# Patient Record
Sex: Male | Born: 1985
Health system: Southern US, Community
[De-identification: ages and names within clinical notes are randomized; demographics above are authoritative.]

## PROBLEM LIST (undated history)

## (undated) DIAGNOSIS — F840 Autistic disorder: Secondary | ICD-10-CM

## (undated) DIAGNOSIS — T7840XA Allergy, unspecified, initial encounter: Secondary | ICD-10-CM

## (undated) DIAGNOSIS — E785 Hyperlipidemia, unspecified: Secondary | ICD-10-CM

## (undated) DIAGNOSIS — F29 Unspecified psychosis not due to a substance or known physiological condition: Secondary | ICD-10-CM

## (undated) DIAGNOSIS — I10 Essential (primary) hypertension: Secondary | ICD-10-CM

## (undated) DIAGNOSIS — E559 Vitamin D deficiency, unspecified: Secondary | ICD-10-CM

## (undated) HISTORY — DX: Unspecified psychosis not due to a substance or known physiological condition: F29

## (undated) HISTORY — DX: Vitamin D deficiency, unspecified: E55.9

## (undated) HISTORY — DX: Essential (primary) hypertension: I10

## (undated) HISTORY — PX: TONSILLECTOMY AND ADENOIDECTOMY: SUR1326

## (undated) HISTORY — DX: Hyperlipidemia, unspecified: E78.5

## (undated) HISTORY — DX: Allergy, unspecified, initial encounter: T78.40XA

## (undated) HISTORY — DX: Autistic disorder: F84.0

---
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
## 2008-07-17 DIAGNOSIS — E291 Testicular hypofunction: Secondary | ICD-10-CM

## 2009-01-15 DIAGNOSIS — E559 Vitamin D deficiency, unspecified: Secondary | ICD-10-CM

## 2011-05-18 DIAGNOSIS — E559 Vitamin D deficiency, unspecified: Secondary | ICD-10-CM | POA: Diagnosis not present

## 2011-05-18 DIAGNOSIS — Z23 Encounter for immunization: Secondary | ICD-10-CM | POA: Diagnosis not present

## 2011-05-18 DIAGNOSIS — L210 Seborrhea capitis: Secondary | ICD-10-CM | POA: Diagnosis not present

## 2011-05-18 DIAGNOSIS — I10 Essential (primary) hypertension: Secondary | ICD-10-CM | POA: Diagnosis not present

## 2011-05-18 DIAGNOSIS — J309 Allergic rhinitis, unspecified: Secondary | ICD-10-CM | POA: Diagnosis not present

## 2011-05-29 DIAGNOSIS — F29 Unspecified psychosis not due to a substance or known physiological condition: Secondary | ICD-10-CM | POA: Diagnosis not present

## 2011-07-10 DIAGNOSIS — F840 Autistic disorder: Secondary | ICD-10-CM | POA: Diagnosis not present

## 2011-07-10 DIAGNOSIS — F29 Unspecified psychosis not due to a substance or known physiological condition: Secondary | ICD-10-CM | POA: Diagnosis not present

## 2011-09-05 DIAGNOSIS — F840 Autistic disorder: Secondary | ICD-10-CM | POA: Diagnosis not present

## 2011-09-05 DIAGNOSIS — Z8659 Personal history of other mental and behavioral disorders: Secondary | ICD-10-CM | POA: Diagnosis not present

## 2011-09-05 DIAGNOSIS — F79 Unspecified intellectual disabilities: Secondary | ICD-10-CM | POA: Diagnosis not present

## 2011-09-05 DIAGNOSIS — F29 Unspecified psychosis not due to a substance or known physiological condition: Secondary | ICD-10-CM | POA: Diagnosis not present

## 2011-09-06 DIAGNOSIS — F840 Autistic disorder: Secondary | ICD-10-CM | POA: Diagnosis not present

## 2011-09-06 DIAGNOSIS — E785 Hyperlipidemia, unspecified: Secondary | ICD-10-CM | POA: Diagnosis not present

## 2011-09-06 DIAGNOSIS — F71 Moderate intellectual disabilities: Secondary | ICD-10-CM | POA: Diagnosis not present

## 2011-09-06 DIAGNOSIS — I10 Essential (primary) hypertension: Secondary | ICD-10-CM | POA: Diagnosis not present

## 2011-09-06 DIAGNOSIS — F79 Unspecified intellectual disabilities: Secondary | ICD-10-CM | POA: Diagnosis not present

## 2011-09-06 DIAGNOSIS — F603 Borderline personality disorder: Secondary | ICD-10-CM | POA: Diagnosis not present

## 2011-09-06 DIAGNOSIS — K5900 Constipation, unspecified: Secondary | ICD-10-CM | POA: Diagnosis not present

## 2011-09-06 DIAGNOSIS — F29 Unspecified psychosis not due to a substance or known physiological condition: Secondary | ICD-10-CM | POA: Diagnosis not present

## 2011-09-06 DIAGNOSIS — Z8659 Personal history of other mental and behavioral disorders: Secondary | ICD-10-CM | POA: Diagnosis not present

## 2011-10-06 DIAGNOSIS — F29 Unspecified psychosis not due to a substance or known physiological condition: Secondary | ICD-10-CM | POA: Diagnosis not present

## 2011-10-06 DIAGNOSIS — F71 Moderate intellectual disabilities: Secondary | ICD-10-CM | POA: Diagnosis not present

## 2011-10-13 DIAGNOSIS — F29 Unspecified psychosis not due to a substance or known physiological condition: Secondary | ICD-10-CM | POA: Diagnosis not present

## 2011-10-13 DIAGNOSIS — E785 Hyperlipidemia, unspecified: Secondary | ICD-10-CM | POA: Diagnosis not present

## 2011-10-13 DIAGNOSIS — F71 Moderate intellectual disabilities: Secondary | ICD-10-CM | POA: Diagnosis not present

## 2011-10-13 DIAGNOSIS — I10 Essential (primary) hypertension: Secondary | ICD-10-CM | POA: Diagnosis not present

## 2011-11-30 DIAGNOSIS — F29 Unspecified psychosis not due to a substance or known physiological condition: Secondary | ICD-10-CM | POA: Diagnosis not present

## 2011-11-30 DIAGNOSIS — F71 Moderate intellectual disabilities: Secondary | ICD-10-CM | POA: Diagnosis not present

## 2011-12-21 DIAGNOSIS — F29 Unspecified psychosis not due to a substance or known physiological condition: Secondary | ICD-10-CM | POA: Diagnosis not present

## 2011-12-21 DIAGNOSIS — F71 Moderate intellectual disabilities: Secondary | ICD-10-CM | POA: Diagnosis not present

## 2012-01-15 DIAGNOSIS — F840 Autistic disorder: Secondary | ICD-10-CM | POA: Diagnosis not present

## 2012-01-15 DIAGNOSIS — F71 Moderate intellectual disabilities: Secondary | ICD-10-CM | POA: Diagnosis not present

## 2012-01-15 DIAGNOSIS — I10 Essential (primary) hypertension: Secondary | ICD-10-CM | POA: Diagnosis not present

## 2012-01-15 DIAGNOSIS — E785 Hyperlipidemia, unspecified: Secondary | ICD-10-CM | POA: Diagnosis not present

## 2012-02-22 DIAGNOSIS — F29 Unspecified psychosis not due to a substance or known physiological condition: Secondary | ICD-10-CM | POA: Diagnosis not present

## 2012-02-22 DIAGNOSIS — F71 Moderate intellectual disabilities: Secondary | ICD-10-CM | POA: Diagnosis not present

## 2012-05-17 DIAGNOSIS — F71 Moderate intellectual disabilities: Secondary | ICD-10-CM | POA: Diagnosis not present

## 2012-05-17 DIAGNOSIS — I10 Essential (primary) hypertension: Secondary | ICD-10-CM | POA: Diagnosis not present

## 2012-05-17 DIAGNOSIS — R350 Frequency of micturition: Secondary | ICD-10-CM | POA: Diagnosis not present

## 2012-05-17 DIAGNOSIS — E785 Hyperlipidemia, unspecified: Secondary | ICD-10-CM | POA: Diagnosis not present

## 2012-05-21 DIAGNOSIS — I10 Essential (primary) hypertension: Secondary | ICD-10-CM | POA: Diagnosis not present

## 2012-05-21 DIAGNOSIS — R81 Glycosuria: Secondary | ICD-10-CM | POA: Diagnosis not present

## 2012-05-21 DIAGNOSIS — R7309 Other abnormal glucose: Secondary | ICD-10-CM | POA: Diagnosis not present

## 2012-05-24 DIAGNOSIS — F71 Moderate intellectual disabilities: Secondary | ICD-10-CM | POA: Diagnosis not present

## 2012-05-24 DIAGNOSIS — F29 Unspecified psychosis not due to a substance or known physiological condition: Secondary | ICD-10-CM | POA: Diagnosis not present

## 2012-06-13 DIAGNOSIS — N390 Urinary tract infection, site not specified: Secondary | ICD-10-CM | POA: Diagnosis not present

## 2012-06-13 DIAGNOSIS — N039 Chronic nephritic syndrome with unspecified morphologic changes: Secondary | ICD-10-CM | POA: Diagnosis not present

## 2012-06-13 DIAGNOSIS — R809 Proteinuria, unspecified: Secondary | ICD-10-CM | POA: Diagnosis not present

## 2012-06-13 DIAGNOSIS — N2581 Secondary hyperparathyroidism of renal origin: Secondary | ICD-10-CM | POA: Diagnosis not present

## 2012-06-13 DIAGNOSIS — I10 Essential (primary) hypertension: Secondary | ICD-10-CM | POA: Diagnosis not present

## 2012-06-28 DIAGNOSIS — I10 Essential (primary) hypertension: Secondary | ICD-10-CM | POA: Diagnosis not present

## 2012-06-28 DIAGNOSIS — N184 Chronic kidney disease, stage 4 (severe): Secondary | ICD-10-CM | POA: Diagnosis not present

## 2012-06-28 DIAGNOSIS — N2581 Secondary hyperparathyroidism of renal origin: Secondary | ICD-10-CM | POA: Diagnosis not present

## 2012-07-30 ENCOUNTER — Ambulatory Visit: Payer: Self-pay | Admitting: Hematology and Oncology

## 2012-07-30 DIAGNOSIS — F840 Autistic disorder: Secondary | ICD-10-CM | POA: Diagnosis not present

## 2012-07-30 DIAGNOSIS — F79 Unspecified intellectual disabilities: Secondary | ICD-10-CM | POA: Diagnosis not present

## 2012-07-30 DIAGNOSIS — N184 Chronic kidney disease, stage 4 (severe): Secondary | ICD-10-CM | POA: Diagnosis not present

## 2012-07-30 DIAGNOSIS — E559 Vitamin D deficiency, unspecified: Secondary | ICD-10-CM | POA: Diagnosis not present

## 2012-07-30 DIAGNOSIS — R5381 Other malaise: Secondary | ICD-10-CM | POA: Diagnosis not present

## 2012-07-30 DIAGNOSIS — R799 Abnormal finding of blood chemistry, unspecified: Secondary | ICD-10-CM | POA: Diagnosis not present

## 2012-07-30 DIAGNOSIS — Z79899 Other long term (current) drug therapy: Secondary | ICD-10-CM | POA: Diagnosis not present

## 2012-07-30 DIAGNOSIS — R350 Frequency of micturition: Secondary | ICD-10-CM | POA: Diagnosis not present

## 2012-07-30 DIAGNOSIS — E291 Testicular hypofunction: Secondary | ICD-10-CM | POA: Diagnosis not present

## 2012-07-30 DIAGNOSIS — I129 Hypertensive chronic kidney disease with stage 1 through stage 4 chronic kidney disease, or unspecified chronic kidney disease: Secondary | ICD-10-CM | POA: Diagnosis not present

## 2012-07-30 LAB — BASIC METABOLIC PANEL
Anion Gap: 7 (ref 7–16)
BUN: 22 mg/dL — ABNORMAL HIGH (ref 7–18)
Calcium, Total: 9.3 mg/dL (ref 8.5–10.1)
Chloride: 102 mmol/L (ref 98–107)
Co2: 30 mmol/L (ref 21–32)
Creatinine: 1.22 mg/dL (ref 0.60–1.30)
Glucose: 92 mg/dL (ref 65–99)
Osmolality: 281 (ref 275–301)
Potassium: 4.5 mmol/L (ref 3.5–5.1)
Sodium: 139 mmol/L (ref 136–145)

## 2012-07-30 LAB — CBC CANCER CENTER
Basophil %: 0.6 %
HCT: 31.7 % — ABNORMAL LOW (ref 40.0–52.0)
Lymphocyte #: 2.1 x10 3/mm (ref 1.0–3.6)
MCH: 30.3 pg (ref 26.0–34.0)
MCV: 92 fL (ref 80–100)
Monocyte #: 1 x10 3/mm (ref 0.2–1.0)
Neutrophil %: 55.6 %
RDW: 14 % (ref 11.5–14.5)

## 2012-07-31 LAB — PROT IMMUNOELECTROPHORES(ARMC)

## 2012-08-06 ENCOUNTER — Ambulatory Visit: Payer: Self-pay | Admitting: Hematology and Oncology

## 2012-08-06 DIAGNOSIS — R799 Abnormal finding of blood chemistry, unspecified: Secondary | ICD-10-CM | POA: Diagnosis not present

## 2012-08-16 DIAGNOSIS — F29 Unspecified psychosis not due to a substance or known physiological condition: Secondary | ICD-10-CM | POA: Diagnosis not present

## 2012-08-16 DIAGNOSIS — F71 Moderate intellectual disabilities: Secondary | ICD-10-CM | POA: Diagnosis not present

## 2012-08-19 DIAGNOSIS — N2581 Secondary hyperparathyroidism of renal origin: Secondary | ICD-10-CM | POA: Diagnosis not present

## 2012-08-19 DIAGNOSIS — N179 Acute kidney failure, unspecified: Secondary | ICD-10-CM | POA: Diagnosis not present

## 2012-08-19 DIAGNOSIS — N170 Acute kidney failure with tubular necrosis: Secondary | ICD-10-CM | POA: Diagnosis not present

## 2012-08-19 DIAGNOSIS — R7989 Other specified abnormal findings of blood chemistry: Secondary | ICD-10-CM | POA: Diagnosis not present

## 2012-08-19 DIAGNOSIS — D631 Anemia in chronic kidney disease: Secondary | ICD-10-CM | POA: Diagnosis not present

## 2012-08-19 DIAGNOSIS — N039 Chronic nephritic syndrome with unspecified morphologic changes: Secondary | ICD-10-CM | POA: Diagnosis not present

## 2012-08-19 DIAGNOSIS — R062 Wheezing: Secondary | ICD-10-CM | POA: Diagnosis not present

## 2012-08-19 DIAGNOSIS — I10 Essential (primary) hypertension: Secondary | ICD-10-CM | POA: Diagnosis not present

## 2012-09-05 ENCOUNTER — Ambulatory Visit: Payer: Self-pay | Admitting: Hematology and Oncology

## 2012-09-16 DIAGNOSIS — I10 Essential (primary) hypertension: Secondary | ICD-10-CM | POA: Diagnosis not present

## 2012-09-16 DIAGNOSIS — E785 Hyperlipidemia, unspecified: Secondary | ICD-10-CM | POA: Diagnosis not present

## 2012-09-16 DIAGNOSIS — F71 Moderate intellectual disabilities: Secondary | ICD-10-CM | POA: Diagnosis not present

## 2012-09-16 DIAGNOSIS — D638 Anemia in other chronic diseases classified elsewhere: Secondary | ICD-10-CM | POA: Diagnosis not present

## 2012-11-15 ENCOUNTER — Emergency Department: Payer: Self-pay | Admitting: Emergency Medicine

## 2012-11-15 DIAGNOSIS — I10 Essential (primary) hypertension: Secondary | ICD-10-CM | POA: Diagnosis not present

## 2012-11-15 DIAGNOSIS — F840 Autistic disorder: Secondary | ICD-10-CM | POA: Diagnosis not present

## 2012-11-15 DIAGNOSIS — R1110 Vomiting, unspecified: Secondary | ICD-10-CM | POA: Diagnosis not present

## 2012-11-15 DIAGNOSIS — Z79899 Other long term (current) drug therapy: Secondary | ICD-10-CM | POA: Diagnosis not present

## 2012-11-15 LAB — COMPREHENSIVE METABOLIC PANEL
Albumin: 4 g/dL (ref 3.4–5.0)
Alkaline Phosphatase: 70 U/L (ref 50–136)
BUN: 12 mg/dL (ref 7–18)
Calcium, Total: 9.5 mg/dL (ref 8.5–10.1)
Co2: 26 mmol/L (ref 21–32)
Creatinine: 0.75 mg/dL (ref 0.60–1.30)
EGFR (African American): >60
Glucose: 133 mg/dL — ABNORMAL HIGH (ref 65–99)
Potassium: 3.9 mmol/L (ref 3.5–5.1)
SGPT (ALT): 44 U/L (ref 12–78)
Sodium: 137 mmol/L (ref 136–145)

## 2012-11-15 LAB — URINALYSIS, COMPLETE
Ketone: NEGATIVE
Leukocyte Esterase: NEGATIVE
Nitrite: NEGATIVE
RBC,UR: <1 /HPF (ref 0–5)
Specific Gravity: 1.029 (ref 1.003–1.030)
Squamous Epithelial: <1

## 2012-11-15 LAB — CBC
HGB: 13.4 g/dL (ref 13.0–18.0)
MCV: 85 fL (ref 80–100)
RBC: 5.04 10*6/uL (ref 4.40–5.90)
RDW: 15.5 % — ABNORMAL HIGH (ref 11.5–14.5)
WBC: 9.9 10*3/uL (ref 3.8–10.6)

## 2012-11-15 LAB — LIPASE, BLOOD: Lipase: 65 U/L — ABNORMAL LOW (ref 73–393)

## 2012-11-18 DIAGNOSIS — R809 Proteinuria, unspecified: Secondary | ICD-10-CM | POA: Diagnosis not present

## 2012-11-18 DIAGNOSIS — R319 Hematuria, unspecified: Secondary | ICD-10-CM | POA: Diagnosis not present

## 2012-11-18 DIAGNOSIS — N179 Acute kidney failure, unspecified: Secondary | ICD-10-CM | POA: Diagnosis not present

## 2012-11-18 DIAGNOSIS — N2581 Secondary hyperparathyroidism of renal origin: Secondary | ICD-10-CM | POA: Diagnosis not present

## 2012-11-18 DIAGNOSIS — I10 Essential (primary) hypertension: Secondary | ICD-10-CM | POA: Diagnosis not present

## 2012-12-11 DIAGNOSIS — F29 Unspecified psychosis not due to a substance or known physiological condition: Secondary | ICD-10-CM | POA: Diagnosis not present

## 2012-12-11 DIAGNOSIS — F71 Moderate intellectual disabilities: Secondary | ICD-10-CM | POA: Diagnosis not present

## 2013-01-13 DIAGNOSIS — R350 Frequency of micturition: Secondary | ICD-10-CM | POA: Diagnosis not present

## 2013-01-13 DIAGNOSIS — R81 Glycosuria: Secondary | ICD-10-CM | POA: Diagnosis not present

## 2013-01-13 DIAGNOSIS — D638 Anemia in other chronic diseases classified elsewhere: Secondary | ICD-10-CM | POA: Diagnosis not present

## 2013-01-13 DIAGNOSIS — E8881 Metabolic syndrome: Secondary | ICD-10-CM | POA: Diagnosis not present

## 2013-01-13 DIAGNOSIS — R7309 Other abnormal glucose: Secondary | ICD-10-CM | POA: Diagnosis not present

## 2013-01-13 DIAGNOSIS — Z79899 Other long term (current) drug therapy: Secondary | ICD-10-CM | POA: Diagnosis not present

## 2013-01-13 DIAGNOSIS — E785 Hyperlipidemia, unspecified: Secondary | ICD-10-CM | POA: Diagnosis not present

## 2013-01-13 DIAGNOSIS — Z87448 Personal history of other diseases of urinary system: Secondary | ICD-10-CM | POA: Diagnosis not present

## 2013-01-13 DIAGNOSIS — I10 Essential (primary) hypertension: Secondary | ICD-10-CM | POA: Diagnosis not present

## 2013-02-07 DIAGNOSIS — F29 Unspecified psychosis not due to a substance or known physiological condition: Secondary | ICD-10-CM | POA: Diagnosis not present

## 2013-02-07 DIAGNOSIS — F71 Moderate intellectual disabilities: Secondary | ICD-10-CM | POA: Diagnosis not present

## 2013-03-28 DIAGNOSIS — F29 Unspecified psychosis not due to a substance or known physiological condition: Secondary | ICD-10-CM | POA: Diagnosis not present

## 2013-03-28 DIAGNOSIS — F71 Moderate intellectual disabilities: Secondary | ICD-10-CM | POA: Diagnosis not present

## 2013-05-15 DIAGNOSIS — I10 Essential (primary) hypertension: Secondary | ICD-10-CM | POA: Diagnosis not present

## 2013-05-15 DIAGNOSIS — R944 Abnormal results of kidney function studies: Secondary | ICD-10-CM | POA: Diagnosis not present

## 2013-05-15 DIAGNOSIS — E8881 Metabolic syndrome: Secondary | ICD-10-CM | POA: Diagnosis not present

## 2013-05-15 DIAGNOSIS — E785 Hyperlipidemia, unspecified: Secondary | ICD-10-CM | POA: Diagnosis not present

## 2013-05-19 DIAGNOSIS — I10 Essential (primary) hypertension: Secondary | ICD-10-CM | POA: Diagnosis not present

## 2013-05-19 DIAGNOSIS — R000 Tachycardia, unspecified: Secondary | ICD-10-CM | POA: Diagnosis not present

## 2013-05-19 DIAGNOSIS — N179 Acute kidney failure, unspecified: Secondary | ICD-10-CM | POA: Diagnosis not present

## 2013-05-19 DIAGNOSIS — R809 Proteinuria, unspecified: Secondary | ICD-10-CM | POA: Diagnosis not present

## 2013-05-28 DIAGNOSIS — F71 Moderate intellectual disabilities: Secondary | ICD-10-CM | POA: Diagnosis not present

## 2013-05-28 DIAGNOSIS — F29 Unspecified psychosis not due to a substance or known physiological condition: Secondary | ICD-10-CM | POA: Diagnosis not present

## 2013-07-30 DIAGNOSIS — F29 Unspecified psychosis not due to a substance or known physiological condition: Secondary | ICD-10-CM | POA: Diagnosis not present

## 2013-07-30 DIAGNOSIS — F71 Moderate intellectual disabilities: Secondary | ICD-10-CM | POA: Diagnosis not present

## 2013-08-21 DIAGNOSIS — E785 Hyperlipidemia, unspecified: Secondary | ICD-10-CM | POA: Diagnosis not present

## 2013-08-21 DIAGNOSIS — I10 Essential (primary) hypertension: Secondary | ICD-10-CM | POA: Diagnosis not present

## 2013-08-21 DIAGNOSIS — F840 Autistic disorder: Secondary | ICD-10-CM | POA: Diagnosis not present

## 2013-08-21 DIAGNOSIS — E8881 Metabolic syndrome: Secondary | ICD-10-CM | POA: Diagnosis not present

## 2013-08-21 LAB — HEMOGLOBIN A1C: Hgb A1c MFr Bld: 4.6 % (ref 4.0–6.0)

## 2013-08-25 DIAGNOSIS — E785 Hyperlipidemia, unspecified: Secondary | ICD-10-CM | POA: Diagnosis not present

## 2013-08-25 DIAGNOSIS — R809 Proteinuria, unspecified: Secondary | ICD-10-CM | POA: Diagnosis not present

## 2013-08-25 DIAGNOSIS — N179 Acute kidney failure, unspecified: Secondary | ICD-10-CM | POA: Diagnosis not present

## 2013-08-25 DIAGNOSIS — D649 Anemia, unspecified: Secondary | ICD-10-CM | POA: Diagnosis not present

## 2013-08-25 DIAGNOSIS — N289 Disorder of kidney and ureter, unspecified: Secondary | ICD-10-CM | POA: Diagnosis not present

## 2013-08-25 DIAGNOSIS — I10 Essential (primary) hypertension: Secondary | ICD-10-CM | POA: Diagnosis not present

## 2013-08-25 LAB — LIPID PANEL
CHOLESTEROL: 173 mg/dL (ref 0–200)
HDL: 35 mg/dL (ref 35–70)
LDL Cholesterol: 102 mg/dL
TRIGLYCERIDES: 178 mg/dL — AB (ref 40–160)

## 2013-09-24 DIAGNOSIS — F29 Unspecified psychosis not due to a substance or known physiological condition: Secondary | ICD-10-CM | POA: Diagnosis not present

## 2013-11-20 DIAGNOSIS — I10 Essential (primary) hypertension: Secondary | ICD-10-CM | POA: Diagnosis not present

## 2013-11-20 DIAGNOSIS — F71 Moderate intellectual disabilities: Secondary | ICD-10-CM | POA: Diagnosis not present

## 2013-11-20 DIAGNOSIS — E8881 Metabolic syndrome: Secondary | ICD-10-CM | POA: Diagnosis not present

## 2013-11-20 DIAGNOSIS — E785 Hyperlipidemia, unspecified: Secondary | ICD-10-CM | POA: Diagnosis not present

## 2013-11-28 DIAGNOSIS — F29 Unspecified psychosis not due to a substance or known physiological condition: Secondary | ICD-10-CM | POA: Diagnosis not present

## 2014-01-22 DIAGNOSIS — F29 Unspecified psychosis not due to a substance or known physiological condition: Secondary | ICD-10-CM | POA: Diagnosis not present

## 2014-01-30 DIAGNOSIS — F29 Unspecified psychosis not due to a substance or known physiological condition: Secondary | ICD-10-CM | POA: Diagnosis not present

## 2014-02-19 DIAGNOSIS — F419 Anxiety disorder, unspecified: Secondary | ICD-10-CM | POA: Diagnosis not present

## 2014-02-20 DIAGNOSIS — G4700 Insomnia, unspecified: Secondary | ICD-10-CM | POA: Diagnosis not present

## 2014-02-20 DIAGNOSIS — E669 Obesity, unspecified: Secondary | ICD-10-CM | POA: Diagnosis not present

## 2014-02-20 DIAGNOSIS — E785 Hyperlipidemia, unspecified: Secondary | ICD-10-CM | POA: Diagnosis not present

## 2014-02-20 DIAGNOSIS — Z713 Dietary counseling and surveillance: Secondary | ICD-10-CM | POA: Diagnosis not present

## 2014-02-20 DIAGNOSIS — J309 Allergic rhinitis, unspecified: Secondary | ICD-10-CM | POA: Diagnosis not present

## 2014-02-20 DIAGNOSIS — F71 Moderate intellectual disabilities: Secondary | ICD-10-CM | POA: Diagnosis not present

## 2014-02-20 DIAGNOSIS — I10 Essential (primary) hypertension: Secondary | ICD-10-CM | POA: Diagnosis not present

## 2014-04-16 DIAGNOSIS — F29 Unspecified psychosis not due to a substance or known physiological condition: Secondary | ICD-10-CM | POA: Diagnosis not present

## 2014-06-15 DIAGNOSIS — F29 Unspecified psychosis not due to a substance or known physiological condition: Secondary | ICD-10-CM | POA: Diagnosis not present

## 2014-06-23 DIAGNOSIS — E785 Hyperlipidemia, unspecified: Secondary | ICD-10-CM | POA: Diagnosis not present

## 2014-06-23 DIAGNOSIS — J309 Allergic rhinitis, unspecified: Secondary | ICD-10-CM | POA: Diagnosis not present

## 2014-06-23 DIAGNOSIS — E559 Vitamin D deficiency, unspecified: Secondary | ICD-10-CM | POA: Diagnosis not present

## 2014-06-23 DIAGNOSIS — L210 Seborrhea capitis: Secondary | ICD-10-CM | POA: Diagnosis not present

## 2014-06-23 DIAGNOSIS — F71 Moderate intellectual disabilities: Secondary | ICD-10-CM | POA: Diagnosis not present

## 2014-06-23 DIAGNOSIS — I10 Essential (primary) hypertension: Secondary | ICD-10-CM | POA: Diagnosis not present

## 2014-06-23 DIAGNOSIS — E669 Obesity, unspecified: Secondary | ICD-10-CM | POA: Diagnosis not present

## 2014-06-23 DIAGNOSIS — G4700 Insomnia, unspecified: Secondary | ICD-10-CM | POA: Diagnosis not present

## 2014-07-13 DIAGNOSIS — F29 Unspecified psychosis not due to a substance or known physiological condition: Secondary | ICD-10-CM | POA: Diagnosis not present

## 2014-08-10 DIAGNOSIS — F29 Unspecified psychosis not due to a substance or known physiological condition: Secondary | ICD-10-CM | POA: Diagnosis not present

## 2014-08-28 DIAGNOSIS — E663 Overweight: Secondary | ICD-10-CM | POA: Diagnosis not present

## 2014-08-28 DIAGNOSIS — Z1389 Encounter for screening for other disorder: Secondary | ICD-10-CM | POA: Diagnosis not present

## 2014-08-28 DIAGNOSIS — R6889 Other general symptoms and signs: Secondary | ICD-10-CM | POA: Diagnosis not present

## 2014-08-28 DIAGNOSIS — Z0000 Encounter for general adult medical examination without abnormal findings: Secondary | ICD-10-CM | POA: Diagnosis not present

## 2014-08-28 DIAGNOSIS — I10 Essential (primary) hypertension: Secondary | ICD-10-CM | POA: Diagnosis not present

## 2014-09-07 DIAGNOSIS — F29 Unspecified psychosis not due to a substance or known physiological condition: Secondary | ICD-10-CM | POA: Diagnosis not present

## 2014-10-23 ENCOUNTER — Ambulatory Visit: Payer: Self-pay | Admitting: Family Medicine

## 2014-10-25 ENCOUNTER — Encounter: Payer: Self-pay | Admitting: Family Medicine

## 2014-10-25 DIAGNOSIS — Z87448 Personal history of other diseases of urinary system: Secondary | ICD-10-CM | POA: Insufficient documentation

## 2014-10-25 DIAGNOSIS — E8881 Metabolic syndrome: Secondary | ICD-10-CM | POA: Insufficient documentation

## 2014-10-25 DIAGNOSIS — L210 Seborrhea capitis: Secondary | ICD-10-CM | POA: Insufficient documentation

## 2014-10-25 DIAGNOSIS — J3089 Other allergic rhinitis: Secondary | ICD-10-CM | POA: Insufficient documentation

## 2014-10-25 DIAGNOSIS — F71 Moderate intellectual disabilities: Secondary | ICD-10-CM | POA: Insufficient documentation

## 2014-10-25 DIAGNOSIS — F29 Unspecified psychosis not due to a substance or known physiological condition: Secondary | ICD-10-CM | POA: Insufficient documentation

## 2014-10-25 DIAGNOSIS — F5104 Psychophysiologic insomnia: Secondary | ICD-10-CM | POA: Insufficient documentation

## 2014-10-25 DIAGNOSIS — F840 Autistic disorder: Secondary | ICD-10-CM | POA: Insufficient documentation

## 2014-10-25 DIAGNOSIS — I10 Essential (primary) hypertension: Secondary | ICD-10-CM | POA: Insufficient documentation

## 2014-10-25 DIAGNOSIS — E669 Obesity, unspecified: Secondary | ICD-10-CM | POA: Insufficient documentation

## 2014-10-28 ENCOUNTER — Ambulatory Visit: Payer: Self-pay | Admitting: Family Medicine

## 2014-11-05 DIAGNOSIS — F209 Schizophrenia, unspecified: Secondary | ICD-10-CM | POA: Diagnosis not present

## 2014-11-10 ENCOUNTER — Other Ambulatory Visit: Payer: Self-pay | Admitting: Family Medicine

## 2014-11-10 ENCOUNTER — Telehealth: Payer: Self-pay | Admitting: Family Medicine

## 2014-11-10 MED ORDER — LISINOPRIL 20 MG PO TABS: 20.0000 mg | ORAL_TABLET | Freq: Every day | ORAL | Status: DC

## 2014-11-10 NOTE — Telephone Encounter (Signed)
I sent refill to his pharmacy today. He can come in after I return. Thank you

## 2014-11-10 NOTE — Telephone Encounter (Signed)
Patient was scheduled an appointment for tomorrow however his mother is not able to get off work unless it is late afternoon. Is it possible to refill his Lisinopril 20mg  and send it to [facility] and we can resch his appointment for when you return or is it possible to work him in Thursday around [phone]

## 2014-11-11 ENCOUNTER — Ambulatory Visit: Payer: Self-pay | Admitting: Family Medicine

## 2014-11-11 ENCOUNTER — Other Ambulatory Visit: Payer: Self-pay | Admitting: Family Medicine

## 2014-11-11 NOTE — Telephone Encounter (Signed)
Patient came in for appointment today. 

## 2014-11-11 NOTE — Progress Notes (Signed)
Patient is coming into today so we can find which pharmacy they prefer and to give them the prescription you printed for them.

## 2014-11-11 NOTE — Telephone Encounter (Signed)
Patient requesting refill. 

## 2014-11-26 DIAGNOSIS — F6089 Other specific personality disorders: Secondary | ICD-10-CM | POA: Diagnosis not present

## 2014-11-26 DIAGNOSIS — F29 Unspecified psychosis not due to a substance or known physiological condition: Secondary | ICD-10-CM | POA: Diagnosis not present

## 2014-11-26 DIAGNOSIS — F71 Moderate intellectual disabilities: Secondary | ICD-10-CM | POA: Diagnosis not present

## 2014-11-26 DIAGNOSIS — I10 Essential (primary) hypertension: Secondary | ICD-10-CM | POA: Diagnosis not present

## 2014-11-26 DIAGNOSIS — F918 Other conduct disorders: Secondary | ICD-10-CM | POA: Diagnosis not present

## 2014-11-26 DIAGNOSIS — F840 Autistic disorder: Secondary | ICD-10-CM | POA: Diagnosis not present

## 2014-11-26 DIAGNOSIS — F911 Conduct disorder, childhood-onset type: Secondary | ICD-10-CM | POA: Diagnosis not present

## 2014-11-26 DIAGNOSIS — Z888 Allergy status to other drugs, medicaments and biological substances status: Secondary | ICD-10-CM | POA: Diagnosis not present

## 2014-12-16 ENCOUNTER — Ambulatory Visit (INDEPENDENT_AMBULATORY_CARE_PROVIDER_SITE_OTHER): Payer: Medicare Other | Admitting: Family Medicine

## 2014-12-16 ENCOUNTER — Encounter: Payer: Self-pay | Admitting: Family Medicine

## 2014-12-16 VITALS — BP 124/82 | HR 98 | Temp 98.1°F | Resp 14 | Ht 64.0 in | Wt 178.8 lb

## 2014-12-16 DIAGNOSIS — I10 Essential (primary) hypertension: Secondary | ICD-10-CM

## 2014-12-16 DIAGNOSIS — E8881 Metabolic syndrome: Secondary | ICD-10-CM | POA: Diagnosis not present

## 2014-12-16 DIAGNOSIS — F71 Moderate intellectual disabilities: Secondary | ICD-10-CM | POA: Diagnosis not present

## 2014-12-16 DIAGNOSIS — R739 Hyperglycemia, unspecified: Secondary | ICD-10-CM | POA: Diagnosis not present

## 2014-12-16 DIAGNOSIS — E559 Vitamin D deficiency, unspecified: Secondary | ICD-10-CM

## 2014-12-16 DIAGNOSIS — Z13220 Encounter for screening for lipoid disorders: Secondary | ICD-10-CM

## 2014-12-16 MED ORDER — LISINOPRIL 20 MG PO TABS: 20.0000 mg | ORAL_TABLET | Freq: Every day | ORAL | Status: DC

## 2014-12-16 NOTE — Progress Notes (Signed)
Name: David Santos   MRN: [id number]    DOB: January 29, 1986   Date:12/16/2014       Progress Note  Subjective  Chief Complaint  Chief Complaint  Patient presents with  . Hypertension    HPI  Hypertension Benign: doing well on Lisinopril , off HCTZ because it caused acute renal failure.  He is compliant with medication -mother dispenses it for him daily   Moderate MR: he stays at home by himself during the day, at times gets aggressive but has been doing well lately. He sees a psychiatrist. Caregiver has to prepare his meals, and dispense his medications  Metabolic Syndrome: he is due for rechecking labs. He is eating healthier but gained some weight since last visit. Denies polyphagia, polydipsia or polyuria.  Patient Active Problem List   Diagnosis Date Noted  . Active autistic disorder 10/25/2014  . Benign essential hypertension 10/25/2014  . Chronic insomnia 10/25/2014  . History of acute renal failure 10/25/2014  . Dysmetabolic syndrome 10/25/2014  . Moderate mental retardation 10/25/2014  . Perennial allergic rhinitis 10/25/2014  . Overweight 10/25/2014  . General psychoses 10/25/2014  . Seborrhea capitis 10/25/2014  . Vitamin D deficiency 01/15/2009  . Testicular hypofunction 07/17/2008    History reviewed. No pertinent past surgical history.  History reviewed. No pertinent family history.  Social History   Social History  . Marital Status: Single    Spouse Name: N/A  . Number of Children: N/A  . Years of Education: N/A   Occupational History  . Not on file.   Social History Main Topics  . Smoking status: Never Smoker   . Smokeless tobacco: Never Used  . Alcohol Use: No  . Drug Use: No  . Sexual Activity: No   Other Topics Concern  . Not on file   Social History Narrative  . No narrative on file     Current outpatient prescriptions:  .  clonazePAM (KLONOPIN) 0.5 MG tablet, Take 1 tablet by mouth as needed., Disp: , Rfl:  .  diphenhydrAMINE  (BENADRYL) 50 MG capsule, Take 50 mg by mouth as needed., Disp: , Rfl:  .  divalproex (DEPAKOTE) 250 MG DR tablet, Take 1 tablet by mouth 2 (two) times daily., Disp: , Rfl:  .  fluticasone (FLONASE) 50 MCG/ACT nasal spray, Place 2 sprays into the nose as needed., Disp: , Rfl:  .  lisinopril (PRINIVIL,ZESTRIL) 20 MG tablet, Take 1 tablet (20 mg total) by mouth daily., Disp: 90 tablet, Rfl: 1 .  risperiDONE (RISPERDAL) 2 MG tablet, Take 1 tablet by mouth 2 (two) times daily., Disp: , Rfl:  .  risperidone (RISPERDAL) 4 MG tablet, Take 4 mg by mouth daily., Disp: , Rfl:   No Known Allergies   ROS  Constitutional: Negative for fever or significant  weight change.  Respiratory: Negative for cough and shortness of breath.   Cardiovascular: Negative for chest pain or palpitations.  Gastrointestinal: Negative for abdominal pain, no bowel changes.  Musculoskeletal: Negative for gait problem or joint swelling.  Skin: Negative for rash.  Neurological: Negative for dizziness or headache.  No other specific complaints in a complete review of systems (except as listed in HPI above).   Objective  Filed Vitals:   12/16/14 1122  BP: 124/82  Pulse: 98  Temp: 98.1 F (36.7 C)  TempSrc: Oral  Resp: 14  Height: 5\' 4"  (1.626 m)  Weight: 178 lb 12.8 oz (81.103 kg)  SpO2: 97%    Body mass index is 30.68  kg/(m^2).  Physical Exam  Constitutional: Patient appears well-developed and well-nourished. Obese No distress.  HEENT: head atraumatic, normocephalic, pupils equal and reactive to light, ears neck supple, throat within normal limits Cardiovascular: Normal rate, regular rhythm and normal heart sounds.  No murmur heard. No BLE edema. Pulmonary/Chest: Effort normal and breath sounds normal. No respiratory distress. Abdominal: Soft.  There is no tenderness. Psychiatric: Patient has a normal mood and affect. behavior is normal. Judgment and thought content normal.    PHQ2/9: Depression screen  PHQ 2/9 12/16/2014  Decreased Interest 0  Down, Depressed, Hopeless 0  PHQ - 2 Score 0    Fall Risk: Fall Risk  12/16/2014  Falls in the past year? No     Assessment & Plan  1. Benign essential hypertension Continue medication - lisinopril (PRINIVIL,ZESTRIL) 20 MG tablet; Take 1 tablet (20 mg total) by mouth daily.  Dispense: 90 tablet; Refill: 1 - CBC - Comprehensive metabolic panel  2. Moderate mental retardation stable  3. Vitamin D deficiency  - Vit D  25 hydroxy (rtn osteoporosis monitoring)  4. Dysmetabolic syndrome Discussed dietary modification, decrease carbohydrate  5. Lipid screening  - Lipid panel  6. Hyperglycemia  - Hemoglobin A1c

## 2014-12-17 ENCOUNTER — Telehealth: Payer: Self-pay

## 2014-12-17 LAB — LIPID PANEL
CHOL/HDL RATIO: 6 ratio — AB (ref 0.0–5.0)
Cholesterol, Total: 199 mg/dL (ref 100–199)
HDL: 33 mg/dL — ABNORMAL LOW (ref >39)
LDL Calculated: 128 mg/dL — ABNORMAL HIGH (ref 0–99)
Triglycerides: 190 mg/dL — ABNORMAL HIGH (ref 0–149)
VLDL Cholesterol Cal: 38 mg/dL (ref 5–40)

## 2014-12-17 LAB — VITAMIN D 25 HYDROXY (VIT D DEFICIENCY, FRACTURES): VIT D 25 HYDROXY: 22.1 ng/mL — AB (ref 30.0–100.0)

## 2014-12-17 LAB — COMPREHENSIVE METABOLIC PANEL
ALT: 26 IU/L (ref 0–44)
AST: 18 IU/L (ref 0–40)
Albumin/Globulin Ratio: 1.6 (ref 1.1–2.5)
Albumin: 4.4 g/dL (ref 3.5–5.5)
Alkaline Phosphatase: 43 IU/L (ref 39–117)
BUN/Creatinine Ratio: 11 (ref 8–19)
BUN: 9 mg/dL (ref 6–20)
Bilirubin Total: 0.3 mg/dL (ref 0.0–1.2)
CO2: 25 mmol/L (ref 18–29)
Calcium: 9.6 mg/dL (ref 8.7–10.2)
Chloride: 99 mmol/L (ref 97–108)
Creatinine, Ser: 0.79 mg/dL (ref 0.76–1.27)
GFR calc non Af Amer: 123 mL/min/{1.73_m2} (ref >59)
GFR, EST AFRICAN AMERICAN: 142 mL/min/{1.73_m2} (ref >59)
GLOBULIN, TOTAL: 2.8 g/dL (ref 1.5–4.5)
GLUCOSE: 85 mg/dL (ref 65–99)
POTASSIUM: 4.3 mmol/L (ref 3.5–5.2)
Sodium: 138 mmol/L (ref 134–144)
TOTAL PROTEIN: 7.2 g/dL (ref 6.0–8.5)

## 2014-12-17 LAB — CBC
HEMATOCRIT: 42.5 % (ref 37.5–51.0)
Hemoglobin: 14.4 g/dL (ref 12.6–17.7)
MCH: 29.5 pg (ref 26.6–33.0)
MCHC: 33.9 g/dL (ref 31.5–35.7)
MCV: 87 fL (ref 79–97)
Platelets: 255 10*3/uL (ref 150–379)
RBC: 4.88 x10E6/uL (ref 4.14–5.80)
RDW: 14.3 % (ref 12.3–15.4)
WBC: 6.2 10*3/uL (ref 3.4–10.8)

## 2014-12-17 LAB — HEMOGLOBIN A1C
Est. average glucose Bld gHb Est-mCnc: 117 mg/dL
Hgb A1c MFr Bld: 5.7 % — ABNORMAL HIGH (ref 4.8–5.6)

## 2014-12-17 NOTE — Telephone Encounter (Signed)
Left vm of labs

## 2014-12-18 ENCOUNTER — Telehealth: Payer: Self-pay | Admitting: Family Medicine

## 2014-12-18 NOTE — Telephone Encounter (Signed)
[name] (mom) is returning your call concerning the test results. She is needing clarification on the voicemail that you left. If you are not able to reach David Santos please call his grandmother to leave the information [name] (Mom) [phone] [name] (Grandmother) [phone]

## 2014-12-18 NOTE — Telephone Encounter (Signed)
Pt mother notified.

## 2014-12-28 DIAGNOSIS — I10 Essential (primary) hypertension: Secondary | ICD-10-CM | POA: Diagnosis not present

## 2014-12-28 DIAGNOSIS — F918 Other conduct disorders: Secondary | ICD-10-CM | POA: Diagnosis not present

## 2014-12-28 DIAGNOSIS — F840 Autistic disorder: Secondary | ICD-10-CM | POA: Diagnosis not present

## 2014-12-28 DIAGNOSIS — Z888 Allergy status to other drugs, medicaments and biological substances status: Secondary | ICD-10-CM | POA: Diagnosis not present

## 2014-12-28 DIAGNOSIS — F71 Moderate intellectual disabilities: Secondary | ICD-10-CM | POA: Diagnosis not present

## 2014-12-28 DIAGNOSIS — Z79899 Other long term (current) drug therapy: Secondary | ICD-10-CM | POA: Diagnosis not present

## 2014-12-29 DIAGNOSIS — F71 Moderate intellectual disabilities: Secondary | ICD-10-CM | POA: Diagnosis not present

## 2014-12-29 DIAGNOSIS — F840 Autistic disorder: Secondary | ICD-10-CM | POA: Diagnosis not present

## 2015-03-03 ENCOUNTER — Ambulatory Visit (INDEPENDENT_AMBULATORY_CARE_PROVIDER_SITE_OTHER): Payer: Medicare Other | Admitting: Family Medicine

## 2015-03-03 ENCOUNTER — Encounter: Payer: Self-pay | Admitting: Family Medicine

## 2015-03-03 VITALS — BP 124/86 | HR 106 | Temp 98.9°F | Resp 14 | Ht 64.0 in | Wt 182.4 lb

## 2015-03-03 DIAGNOSIS — I10 Essential (primary) hypertension: Secondary | ICD-10-CM | POA: Diagnosis not present

## 2015-03-03 DIAGNOSIS — F71 Moderate intellectual disabilities: Secondary | ICD-10-CM | POA: Diagnosis not present

## 2015-03-03 DIAGNOSIS — F840 Autistic disorder: Secondary | ICD-10-CM

## 2015-03-03 NOTE — Progress Notes (Signed)
Name: David Santos   MRN: [id number]    DOB: Oct 11, 1985   Date:03/03/2015       Progress Note  Subjective  Chief Complaint  Chief Complaint  Patient presents with  . Medication Refill    follow-up     mother needs [employer] paperwork filled out for work  . Hypertension    HPI  Hypertension Benign: doing well on Lisinopril , off HCTZ because it caused acute renal failure. He is compliant with medication -mother dispenses it for him daily , no chest pain, no SOB, no side effects of medication   Moderate MR: he is now staying with grandparents during the day, at times gets aggressive but has been doing well lately. He sees a Therapist, sports at [facility], but will be switching providers soon. . Caregiver has to prepare his meals, and dispense his medications, take him to doctor's visits. He cannot drive and his mother is his guardian. He is deemed incompetent.   Autistic Disorder: poor eye contact, retracts when around strangers.     Patient Active Problem List   Diagnosis Date Noted  . Active autistic disorder 10/25/2014  . Benign essential hypertension 10/25/2014  . Chronic insomnia 10/25/2014  . History of acute renal failure 10/25/2014  . Dysmetabolic syndrome 10/25/2014  . Moderate mental retardation 10/25/2014  . Perennial allergic rhinitis 10/25/2014  . Overweight 10/25/2014  . General psychoses 10/25/2014  . Seborrhea capitis 10/25/2014  . Vitamin D deficiency 01/15/2009  . Testicular hypofunction 07/17/2008    History reviewed. No pertinent past surgical history.  History reviewed. No pertinent family history.  Social History   Social History  . Marital Status: Single    Spouse Name: N/A  . Number of Children: N/A  . Years of Education: N/A   Occupational History  . Not on file.   Social History Main Topics  . Smoking status: Never Smoker   . Smokeless tobacco: Never Used  . Alcohol Use: No  . Drug Use: No  . Sexual Activity: No   Other Topics  Concern  . Not on file   Social History Narrative     Current outpatient prescriptions:  .  clonazePAM (KLONOPIN) 0.5 MG tablet, Take 1 tablet by mouth as needed., Disp: , Rfl:  .  diphenhydrAMINE (BENADRYL) 50 MG capsule, Take 50 mg by mouth as needed., Disp: , Rfl:  .  divalproex (DEPAKOTE) 250 MG DR tablet, Take 1 tablet by mouth 2 (two) times daily., Disp: , Rfl:  .  fluticasone (FLONASE) 50 MCG/ACT nasal spray, Place 2 sprays into the nose as needed., Disp: , Rfl:  .  lisinopril (PRINIVIL,ZESTRIL) 20 MG tablet, Take 1 tablet (20 mg total) by mouth daily., Disp: 90 tablet, Rfl: 1 .  risperiDONE (RISPERDAL) 2 MG tablet, Take 1 tablet by mouth 2 (two) times daily., Disp: , Rfl:  .  risperidone (RISPERDAL) 4 MG tablet, Take 4 mg by mouth daily., Disp: , Rfl:   No Known Allergies   ROS  Ten systems reviewed and is negative except as mentioned in HPI   Objective  Filed Vitals:   03/03/15 1011  BP: 124/86  Pulse: 106  Temp: 98.9 F (37.2 C)  TempSrc: Oral  Resp: 14  Height:  (1.626 m)  Weight: 182 lb 6.4 oz (82.736 kg)  SpO2: 96%    Body mass index is 31.29 kg/(m^2).  Physical Exam  Constitutional: Patient appears well-developed and well-nourished. Obese No distress.  HEENT: head atraumatic, normocephalic, pupils equal and  reactive to light, ears neck supple, throat within normal limits Cardiovascular: Normal rate, regular rhythm and normal heart sounds. No murmur heard. No BLE edema. Pulmonary/Chest: Effort normal and breath sounds normal. No respiratory distress. Abdominal: Soft. There is no tenderness. Psychiatric: Normal mood, blunt effect. Cooperative.  Poor eye contact  Recent Results (from the past 2160 hour(s))  CBC     Status: None   Collection Time: 12/16/14 12:20 PM  Result Value Ref Range   WBC 6.2 3.4 - 10.8 x10E3/uL   RBC 4.88 4.14 - 5.80 x10E6/uL   Hemoglobin 14.4 12.6 - 17.7 g/dL   Hematocrit 04.5 40.9 - 51.0 %   MCV 87 79 - 97 fL   MCH  29.5 26.6 - 33.0 pg   MCHC 33.9 31.5 - 35.7 g/dL   RDW 81.1 91.4 - 78.2 %   Platelets 255 150 - 379 x10E3/uL  Vit D  25 hydroxy (rtn osteoporosis monitoring)     Status: Abnormal   Collection Time: 12/16/14 12:20 PM  Result Value Ref Range   Vit D, 25-Hydroxy 22.1 (L) 30.0 - 100.0 ng/mL    Comment: Vitamin D deficiency has been defined by the Institute of Medicine and an Endocrine Society practice guideline as a level of serum 25-OH vitamin D less than 20 ng/mL (1,2). The Endocrine Society went on to further define vitamin D insufficiency as a level between 21 and 29 ng/mL (2). 1. IOM (Institute of Medicine). 2010. Dietary reference    intakes for calcium and D. [place]: The    [employer]. 2. Holick MF, Binkley Manata, Bischoff-Ferrari HA, et al.    Evaluation, treatment, and prevention of vitamin D    deficiency: an Endocrine Society clinical practice    guideline. JCEM. 2011 Jul; 96(7):1911-30.   Comprehensive metabolic panel     Status: None   Collection Time: 12/16/14 12:20 PM  Result Value Ref Range   Glucose 85 65 - 99 mg/dL   BUN 9 6 - 20 mg/dL   Creatinine, Ser 9.56 0.76 - 1.27 mg/dL   GFR calc non Af Amer 123 >59 mL/min/1.73   GFR calc Af Amer 142 >59 mL/min/1.73   BUN/Creatinine Ratio 11 8 - 19   Sodium 138 134 - 144 mmol/L   Potassium 4.3 3.5 - 5.2 mmol/L   Chloride 99 97 - 108 mmol/L   CO2 25 18 - 29 mmol/L   Calcium 9.6 8.7 - 10.2 mg/dL   Total Protein 7.2 6.0 - 8.5 g/dL   Albumin 4.4 3.5 - 5.5 g/dL   Globulin, Total 2.8 1.5 - 4.5 g/dL   Albumin/Globulin Ratio 1.6 1.1 - 2.5   Bilirubin Total 0.3 0.0 - 1.2 mg/dL   Alkaline Phosphatase 43 39 - 117 IU/L   AST 18 0 - 40 IU/L   ALT 26 0 - 44 IU/L  Lipid panel     Status: Abnormal   Collection Time: 12/16/14 12:20 PM  Result Value Ref Range   Cholesterol, Total 199 100 - 199 mg/dL   Triglycerides 213 (H) 0 - 149 mg/dL   HDL 33 (L) >08 mg/dL    Comment: According to ATP-III Guidelines, HDL-C >59  mg/dL is considered a negative risk factor for CHD.    VLDL Cholesterol Cal 38 5 - 40 mg/dL   LDL Calculated 657 (H) 0 - 99 mg/dL   Chol/HDL Ratio 6.0 (H) 0.0 - 5.0 ratio units    Comment:  T. Chol/HDL Ratio                                             Men  Women                               1/2 Avg.Risk  3.4    3.3                                   Avg.Risk  5.0    4.4                                2X Avg.Risk  9.6    7.1                                3X Avg.Risk 23.4   11.0   Hemoglobin A1c     Status: Abnormal   Collection Time: 12/16/14 12:20 PM  Result Value Ref Range   Hgb A1c MFr Bld 5.7 (H) 4.8 - 5.6 %    Comment:          Pre-diabetes: 5.7 - 6.4          Diabetes: >6.4          Glycemic control for adults with diabetes: <7.0    Est. average glucose Bld gHb Est-mCnc 117 mg/dL    JYN8/2PHQ2/9: Depression screen [facility] 2/9 03/03/2015 12/16/2014  Decreased Interest 0 0  Down, Depressed, Hopeless 0 0  PHQ - 2 Score 0 0     Fall Risk: Fall Risk  03/03/2015 12/16/2014  Falls in the past year? No No     Functional Status Survey: Is the patient deaf or have difficulty hearing?: No Does the patient have difficulty seeing, even when wearing glasses/contacts?: Yes (glasses) Does the patient have difficulty concentrating, remembering, or making decisions?: Yes Does the patient have difficulty walking or climbing stairs?: No Does the patient have difficulty dressing or bathing?: No Does the patient have difficulty doing errands alone such as visiting a doctor's office or shopping?: Yes    Assessment & Plan  1. Benign essential hypertension  Continue medication, bp is at goal   2. Moderate mental retardation  Stable, filled FMLA forms for his mother, continue follow up with Psychiatrist  3. Active autistic disorder  Stable, lives with mother but spends the day with grandparents while mother is working

## 2015-03-25 DIAGNOSIS — F840 Autistic disorder: Secondary | ICD-10-CM | POA: Diagnosis not present

## 2015-06-18 ENCOUNTER — Ambulatory Visit: Payer: Medicare Other | Admitting: Family Medicine

## 2015-06-22 ENCOUNTER — Ambulatory Visit: Payer: Medicare Other | Admitting: Family Medicine

## 2015-06-28 ENCOUNTER — Encounter: Payer: Self-pay | Admitting: Family Medicine

## 2015-06-28 ENCOUNTER — Ambulatory Visit (INDEPENDENT_AMBULATORY_CARE_PROVIDER_SITE_OTHER): Payer: Medicare Other | Admitting: Family Medicine

## 2015-06-28 VITALS — BP 134/78 | HR 96 | Temp 98.1°F | Resp 18 | Ht 64.0 in | Wt 188.1 lb

## 2015-06-28 DIAGNOSIS — F71 Moderate intellectual disabilities: Secondary | ICD-10-CM | POA: Diagnosis not present

## 2015-06-28 DIAGNOSIS — E8881 Metabolic syndrome: Secondary | ICD-10-CM

## 2015-06-28 DIAGNOSIS — Z8659 Personal history of other mental and behavioral disorders: Secondary | ICD-10-CM | POA: Diagnosis not present

## 2015-06-28 DIAGNOSIS — I10 Essential (primary) hypertension: Secondary | ICD-10-CM | POA: Diagnosis not present

## 2015-06-28 DIAGNOSIS — E785 Hyperlipidemia, unspecified: Secondary | ICD-10-CM | POA: Insufficient documentation

## 2015-06-28 DIAGNOSIS — J309 Allergic rhinitis, unspecified: Secondary | ICD-10-CM | POA: Diagnosis not present

## 2015-06-28 DIAGNOSIS — E559 Vitamin D deficiency, unspecified: Secondary | ICD-10-CM | POA: Diagnosis not present

## 2015-06-28 DIAGNOSIS — F840 Autistic disorder: Secondary | ICD-10-CM

## 2015-06-28 DIAGNOSIS — J3089 Other allergic rhinitis: Secondary | ICD-10-CM

## 2015-06-28 MED ORDER — LISINOPRIL 20 MG PO TABS: 20.0000 mg | ORAL_TABLET | Freq: Every day | ORAL | Status: DC

## 2015-06-28 MED ORDER — FLUTICASONE PROPIONATE 50 MCG/ACT NA SUSP: 2.0000 | Freq: Every day | NASAL | Status: DC

## 2015-06-28 NOTE — Patient Instructions (Signed)
Lipid panel shows low HDL : to improve HDL he  needs to eat tree nuts ( pecans/pistachios/almonds ) four times weekly, eat fish two times weekly  and exercise  at least 150 minutes per week  Diabetes Mellitus and Food It is important for you to manage your blood sugar (glucose) level. Your blood glucose level can be greatly affected by what you eat. Eating healthier foods in the appropriate amounts throughout the day at about the same time each day will help you control your blood glucose level. It can also help slow or prevent worsening of your diabetes mellitus. Healthy eating may even help you improve the level of your blood pressure and reach or maintain a healthy weight.  General recommendations for healthful eating and cooking habits include:  Eating meals and snacks regularly. Avoid going long periods of time without eating to lose weight.  Eating a diet that consists mainly of plant-based foods, such as fruits, vegetables, nuts, legumes, and whole grains.  Using low-heat cooking methods, such as baking, instead of high-heat cooking methods, such as deep frying. Work with your dietitian to make sure you understand how to use the Nutrition Facts information on food labels. HOW CAN FOOD AFFECT ME? Carbohydrates Carbohydrates affect your blood glucose level more than any other type of food. Your dietitian will help you determine how many carbohydrates to eat at each meal and teach you how to count carbohydrates. Counting carbohydrates is important to keep your blood glucose at a healthy level, especially if you are using insulin or taking certain medicines for diabetes mellitus. Alcohol Alcohol can cause sudden decreases in blood glucose (hypoglycemia), especially if you use insulin or take certain medicines for diabetes mellitus. Hypoglycemia can be a life-threatening condition. Symptoms of hypoglycemia (sleepiness, dizziness, and disorientation) are similar to symptoms of having too much  alcohol.  If your health care provider has given you approval to drink alcohol, do so in moderation and use the following guidelines:  Women should not have more than one drink per day, and men should not have more than two drinks per day. One drink is equal to:  12 oz of beer.  5 oz of wine.  1 oz of hard liquor.  Do not drink on an empty stomach.  Keep yourself hydrated. Have water, diet soda, or unsweetened iced tea.  Regular soda, juice, and other mixers might contain a lot of carbohydrates and should be counted. WHAT FOODS ARE NOT RECOMMENDED? As you make food choices, it is important to remember that all foods are not the same. Some foods have fewer nutrients per serving than other foods, even though they might have the same number of calories or carbohydrates. It is difficult to get your body what it needs when you eat foods with fewer nutrients. Examples of foods that you should avoid that are high in calories and carbohydrates but low in nutrients include:  Trans fats (most processed foods list trans fats on the Nutrition Facts label).  Regular soda.  Juice.  Candy.  Sweets, such as cake, pie, doughnuts, and cookies.  Fried foods. WHAT FOODS CAN I EAT? Eat nutrient-rich foods, which will nourish your body and keep you healthy. The food you should eat also will depend on several factors, including:  The calories you need.  The medicines you take.  Your weight.  Your blood glucose level.  Your blood pressure level.  Your cholesterol level. You should eat a variety of foods, including:  Protein.  Lean cuts  of meat.  Proteins low in saturated fats, such as fish, egg whites, and beans. Avoid processed meats.  Fruits and vegetables.  Fruits and vegetables that may help control blood glucose levels, such as apples, mangoes, and yams.  Dairy products.  Choose fat-free or low-fat dairy products, such as milk, yogurt, and cheese.  Grains, bread, pasta, and  rice.  Choose whole grain products, such as multigrain bread, whole oats, and brown rice. These foods may help control blood pressure.  Fats.  Foods containing healthful fats, such as nuts, avocado, olive oil, canola oil, and fish. DOES EVERYONE WITH DIABETES MELLITUS HAVE THE SAME MEAL PLAN? Because every person with diabetes mellitus is different, there is not one meal plan that works for everyone. It is very important that you meet with a dietitian who will help you create a meal plan that is just right for you.   This information is not intended to replace advice given to you by your health care provider. Make sure you discuss any questions you have with your health care provider.   Document Released: 01/19/2005 Document Revised: 05/15/2014 Document Reviewed: 03/21/2013 Elsevier Interactive Patient Education [employer]

## 2015-06-28 NOTE — Progress Notes (Signed)
Name: David Santos   MRN: [id number]    DOB: 1985/08/17   Date:06/28/2015       Progress Note  Subjective  Chief Complaint  Chief Complaint  Patient presents with  . Medication Refill    6 month F/U   . Hypertension  . Moderate mental retardation    Seeing psychiatrist for medication and doing well with moods.  . Allergic Rhinitis     Having headaches and feeling stopped up    HPI  Hypertension Benign: doing well on Lisinopril , off HCTZ because it caused acute renal failure. He is compliant with medication -mother dispenses it for him daily . He is unable to self medicate. He denies chest pain or palpitation   Moderate MR: he stays at home by himself during the day, at times gets aggressive but has been doing well lately. He sees a psychiatrist. Caregiver has to prepare his meals, and dispense his medications. No recent episodes of psychosis but was hospitalized for that in the past  Metabolic Syndrome:He is not eating healthy again, has gained weight. Takes high risk medication ( Risperdal ) and has not been physically active . Denies polyphagia, polydipsia or polyuria.  Dyslipidemia: discussed importance of changing his diet.    Patient Active Problem List   Diagnosis Date Noted  . Dyslipidemia 06/28/2015  . Active autistic disorder 10/25/2014  . Benign essential hypertension 10/25/2014  . Chronic insomnia 10/25/2014  . History of acute renal failure 10/25/2014  . Dysmetabolic syndrome 10/25/2014  . Moderate mental retardation 10/25/2014  . Perennial allergic rhinitis 10/25/2014  . Overweight 10/25/2014  . General psychoses 10/25/2014  . Seborrhea capitis 10/25/2014  . Vitamin D deficiency 01/15/2009  . Testicular hypofunction 07/17/2008    History reviewed. No pertinent past surgical history.  History reviewed. No pertinent family history.  Social History   Social History  . Marital Status: Single    Spouse Name: N/A  . Number of Children: N/A  . Years  of Education: N/A   Occupational History  . Not on file.   Social History Main Topics  . Smoking status: Never Smoker   . Smokeless tobacco: Never Used  . Alcohol Use: No  . Drug Use: No  . Sexual Activity: No   Other Topics Concern  . Not on file   Social History Narrative     Current outpatient prescriptions:  .  clonazePAM (KLONOPIN) 0.5 MG tablet, Take 1 tablet by mouth as needed., Disp: , Rfl:  .  diphenhydrAMINE (BENADRYL) 50 MG capsule, Take 50 mg by mouth as needed., Disp: , Rfl:  .  divalproex (DEPAKOTE) 250 MG DR tablet, Take 1 tablet by mouth 2 (two) times daily., Disp: , Rfl:  .  fluticasone (FLONASE) 50 MCG/ACT nasal spray, Place 2 sprays into both nostrils daily., Disp: 16 g, Rfl: 2 .  lisinopril (PRINIVIL,ZESTRIL) 20 MG tablet, Take 1 tablet (20 mg total) by mouth daily., Disp: 90 tablet, Rfl: 1 .  risperiDONE (RISPERDAL) 1 MG tablet, TK 1 T PO QAM, Disp: , Rfl: 0 .  risperidone (RISPERDAL) 4 MG tablet, TK 1 T PO HS, Disp: , Rfl: 0  No Known Allergies   ROS  Constitutional: Negative for fever , positive for  weight change.  Respiratory: Negative for cough and shortness of breath.   Cardiovascular: Negative for chest pain or palpitations.  Gastrointestinal: Negative for abdominal pain, no bowel changes.  Musculoskeletal: Negative for gait problem or joint swelling.  Skin: Negative for rash.  Neurological: Negative for dizziness or headache.  No other specific complaints in a complete review of systems (except as listed in HPI above).  Objective  Filed Vitals:   06/28/15 1136  BP: 134/78  Pulse: 96  Temp: 98.1 F (36.7 C)  TempSrc: Oral  Resp: 18  Height:  (1.626 m)  Weight: 188 lb 1.6 oz (85.322 kg)  SpO2: 97%    Body mass index is 32.27 kg/(m^2).  Physical Exam  Constitutional: Patient appears well-developed and well-nourished. Obese  No distress.  HEENT: head atraumatic, normocephalic, pupils equal and reactive to light,neck supple,  throat within normal limits Cardiovascular: Normal rate, regular rhythm and normal heart sounds.  No murmur heard. No BLE edema. Pulmonary/Chest: Effort normal and breath sounds normal. No respiratory distress. Abdominal: Soft.  There is no tenderness. Psychiatric: Patient has a normal mood and affect. Cooperative.   PHQ2/9: Depression screen [facility] 2/9 06/28/2015 03/03/2015 12/16/2014  Decreased Interest 0 0 0  Down, Depressed, Hopeless 0 0 0  PHQ - 2 Score 0 0 0    Fall Risk: Fall Risk  06/28/2015 03/03/2015 12/16/2014  Falls in the past year? No No No    Functional Status Survey: Is the patient deaf or have difficulty hearing?: No Does the patient have difficulty seeing, even when wearing glasses/contacts?: No Does the patient have difficulty concentrating, remembering, or making decisions?: Yes (Mental Retardation) Does the patient have difficulty walking or climbing stairs?: No Does the patient have difficulty dressing or bathing?: No Does the patient have difficulty doing errands alone such as visiting a doctor's office or shopping?: Yes (Unable to drive)    Assessment & Plan  1. Benign essential hypertension  - lisinopril (PRINIVIL,ZESTRIL) 20 MG tablet; Take 1 tablet (20 mg total) by mouth daily.  Dispense: 90 tablet; Refill: 1  2. Moderate mental retardation  stable  3. Active autistic disorder  stable  4. Dysmetabolic syndrome  Discussed dietary modification   5. History of psychosis  Not recently   6. Vitamin D deficiency  Take otc vitamin D   7. Dyslipidemia  Lipid panel shows low HDL : to improve HDL patient  needs to eat tree nuts ( pecans/pistachios/almonds ) four times weekly, eat fish two times weekly  and exercise  at least 150 minutes per week  8. Perennial allergic rhinitis  - fluticasone (FLONASE) 50 MCG/ACT nasal spray; Place 2 sprays into both nostrils daily.  Dispense: 16 g; Refill: 2

## 2015-07-13 DIAGNOSIS — F29 Unspecified psychosis not due to a substance or known physiological condition: Secondary | ICD-10-CM | POA: Diagnosis not present

## 2015-09-17 ENCOUNTER — Encounter: Payer: Self-pay | Admitting: Family Medicine

## 2015-09-17 ENCOUNTER — Ambulatory Visit (INDEPENDENT_AMBULATORY_CARE_PROVIDER_SITE_OTHER): Payer: Medicare Other | Admitting: Family Medicine

## 2015-09-17 VITALS — BP 156/78 | HR 113 | Temp 98.9°F | Resp 18 | Ht 64.0 in | Wt 204.3 lb

## 2015-09-17 DIAGNOSIS — I10 Essential (primary) hypertension: Secondary | ICD-10-CM | POA: Diagnosis not present

## 2015-09-17 DIAGNOSIS — Z0000 Encounter for general adult medical examination without abnormal findings: Secondary | ICD-10-CM | POA: Diagnosis not present

## 2015-09-17 NOTE — Progress Notes (Signed)
Name: David Santos   MRN: [id number]    DOB: [birth year]   Date:09/17/2015       Progress Note  Subjective  Chief Complaint  Chief Complaint  Patient presents with  . Annual Exam    patient's mom has some paperwork to be completed    HPI  HTN: he has been taking his medication, sees nephrologist. Mother states bp at home has been at goal. No chest pain or SOB.   Functional ability/safety issues:  He has MR , lives with mother and cannot self medication or take care of finances, only work was at [employer] - currently trying to go to a group home and brought FMLA to be filled out.   Hearing issues: Addressed  Activities of daily living: Discussed Home safety issues: No Issues  End Of Life Planning: Offered verbal information regarding advanced directives, healthcare power of attorney.  Preventative care, Health maintenance, Preventative health measures discussed.  Preventative screenings discussed today: lab work  Men age 28 to 2775 years if ever smoked recommended to get a one time AAA ultrasound screening exam.  Low Dose CT Chest recommended if Age 12-80 years, 30 pack-year currently smoking OR have quit w/in 15years.   Lifestyle risk factor issued reviewed: Diet, exercise, weight management, advised patient smoking is not healthy, nutrition/diet.  Preventative health measures discussed (5-10 year plan).  Reviewed and recommended vaccinations: - Pneumovax  - Prevnar  - Annual Influenza - Zostavax - Tdap   Depression screening: Done Fall risk screening: Done Discuss ADLs/IADLs: Done  Current medical providers: See HPI  Other health risk factors identified this visit: obesity, pre-diabetes elevated bp Cognitive impairment issues: yes. Sees psychiatrist   All above discussed with patient. Appropriate education, counseling and referral will be made based upon the above.   Patient Active Problem List   Diagnosis Date Noted  . Dyslipidemia 06/28/2015  . Active  autistic disorder 10/25/2014  . Benign essential hypertension 10/25/2014  . Chronic insomnia 10/25/2014  . History of acute renal failure 10/25/2014  . Dysmetabolic syndrome 10/25/2014  . Moderate mental retardation 10/25/2014  . Perennial allergic rhinitis 10/25/2014  . Obese 10/25/2014  . General psychoses 10/25/2014  . Seborrhea capitis 10/25/2014  . Vitamin D deficiency 01/15/2009  . Testicular hypofunction 07/17/2008    Past Surgical History  Procedure Laterality Date  . Tonsillectomy and adenoidectomy      Family History  Problem Relation Age of Onset  . Hypertension Mother   . Cancer Mother   . Graves' disease Mother   . Mental illness Father     Social History   Social History  . Marital Status: Single    Spouse Name: N/A  . Number of Children: N/A  . Years of Education: N/A   Occupational History  . Not on file.   Social History Main Topics  . Smoking status: Never Smoker   . Smokeless tobacco: Never Used  . Alcohol Use: No  . Drug Use: No  . Sexual Activity: No   Other Topics Concern  . Not on file   Social History Narrative     Current outpatient prescriptions:  .  clonazePAM (KLONOPIN) 1 MG tablet, , Disp: , Rfl:  .  diphenhydrAMINE (BENADRYL) 50 MG capsule, Take 50 mg by mouth as needed., Disp: , Rfl:  .  divalproex (DEPAKOTE ER) 500 MG 24 hr tablet, TK 2 TS PO AT 8 PM, Disp: , Rfl: 0 .  fluticasone (FLONASE) 50 MCG/ACT nasal spray, Place 2  sprays into both nostrils daily., Disp: 16 g, Rfl: 2 .  lisinopril (PRINIVIL,ZESTRIL) 20 MG tablet, Take 1 tablet (20 mg total) by mouth daily., Disp: 90 tablet, Rfl: 1 .  risperiDONE (RISPERDAL) 1 MG tablet, TK 1 T PO QAM, Disp: , Rfl: 0 .  risperidone (RISPERDAL) 4 MG tablet, TK 1 T PO HS, Disp: , Rfl: 0  No Known Allergies   ROS  Constitutional: Negative for fever or weight change.  Respiratory: Negative for cough and shortness of breath.   Cardiovascular: Negative for chest pain or palpitations.   Gastrointestinal: Negative for abdominal pain, no bowel changes.  Musculoskeletal: Negative for gait problem or joint swelling.  Skin:  Positive  for rash.  Neurological: Negative for dizziness or headache.  No other specific complaints in a complete review of systems (except as listed in HPI above).  Objective  Filed Vitals:   09/17/15 1346  BP: 156/78  Pulse: 113  Temp: 98.9 F (37.2 C)  TempSrc: Oral  Resp: 18  Height:  (1.626 m)  Weight: 204 lb 4.8 oz (92.67 kg)  SpO2: 96%    Body mass index is 35.05 kg/(m^2).  Physical Exam  Constitutional: Patient appears well-developed and obese. No distress.  HENT: Head: Normocephalic and atraumatic. Ears: B TMs ok, no erythema or effusion; Nose: Nose normal. Mouth/Throat: Oropharynx is clear and moist. No oropharyngeal exudate.  Eyes: Conjunctivae and EOM are normal. Pupils are equal, round, and reactive to light. No scleral icterus.  Neck: Normal range of motion. Neck supple. No JVD present. No thyromegaly present.  Cardiovascular: Normal rate, regular rhythm and normal heart sounds.  No murmur heard. No BLE edema. Pulmonary/Chest: Effort normal and breath sounds normal. No respiratory distress. Abdominal: Soft. Bowel sounds are normal, no distension. There is no tenderness. no masses MALE GENITALIA: Normal descended testes bilaterally, no masses palpated, no hernias, no lesions, no discharge RECTAL:not done Musculoskeletal: Normal range of motion, no joint effusions. No gross deformities Neurological: he is alert and oriented to person, place, and time. No cranial nerve deficit. Coordination, balance, strength, speech and gait are normal.  Skin: Skin is warm and dry. Dandruff, also has some facial acne and facial seborrhea Psychiatric: Patient has a normal mood and affect. Poor eye contact, cooperative but unreliable and mother had to give the history    PHQ2/9: Depression screen [facility] 2/9 09/17/2015 06/28/2015 03/03/2015  12/16/2014  Decreased Interest 0 0 0 0  Down, Depressed, Hopeless 0 0 0 0  PHQ - 2 Score 0 0 0 0     Fall Risk: Fall Risk  09/17/2015 06/28/2015 03/03/2015 12/16/2014  Falls in the past year? No No No No      Functional Status Survey: Is the patient deaf or have difficulty hearing?: No Does the patient have difficulty seeing, even when wearing glasses/contacts?: No Does the patient have difficulty concentrating, remembering, or making decisions?: Yes Does the patient have difficulty walking or climbing stairs?: No Does the patient have difficulty dressing or bathing?: No Does the patient have difficulty doing errands alone such as visiting a doctor's office or shopping?: Yes    Assessment & Plan  1. Medicare annual wellness visit, subsequent  Discussed importance of 150 minutes of physical activity weekly, eat two servings of fish weekly, eat one serving of tree nuts ( cashews, pistachios, pecans, almonds.Marland Kitchen) every other day, eat 6 servings of fruit/vegetables daily and drink plenty of water and avoid sweet beverages.    2. Benign essential hypertension  bp  usually at goal, he has gained weight, also nervous when he came in, we will recheck next visit

## 2015-10-03 ENCOUNTER — Emergency Department
Admission: EM | Admit: 2015-10-03 | Discharge: 2015-10-04 | Disposition: A | Discharge status: Home / Self Care | Payer: Medicare Other | Attending: Emergency Medicine | Admitting: Emergency Medicine

## 2015-10-03 DIAGNOSIS — F840 Autistic disorder: Secondary | ICD-10-CM | POA: Insufficient documentation

## 2015-10-03 DIAGNOSIS — Z7951 Long term (current) use of inhaled steroids: Secondary | ICD-10-CM | POA: Diagnosis not present

## 2015-10-03 DIAGNOSIS — Z79899 Other long term (current) drug therapy: Secondary | ICD-10-CM | POA: Diagnosis not present

## 2015-10-03 DIAGNOSIS — E785 Hyperlipidemia, unspecified: Secondary | ICD-10-CM | POA: Diagnosis not present

## 2015-10-03 DIAGNOSIS — I10 Essential (primary) hypertension: Secondary | ICD-10-CM | POA: Diagnosis not present

## 2015-10-03 DIAGNOSIS — F29 Unspecified psychosis not due to a substance or known physiological condition: Secondary | ICD-10-CM

## 2015-10-03 LAB — CBC
HCT: 42.1 % (ref 40.0–52.0)
Hemoglobin: 14.4 g/dL (ref 13.0–18.0)
MCH: 30.3 pg (ref 26.0–34.0)
MCHC: 34.3 g/dL (ref 32.0–36.0)
MCV: 88.6 fL (ref 80.0–100.0)
Platelets: 247 10*3/uL (ref 150–440)
RBC: 4.75 MIL/uL (ref 4.40–5.90)
RDW: 14 % (ref 11.5–14.5)
WBC: 7.9 10*3/uL (ref 3.8–10.6)

## 2015-10-03 LAB — COMPREHENSIVE METABOLIC PANEL
ALBUMIN: 4.3 g/dL (ref 3.5–5.0)
ALK PHOS: 52 U/L (ref 38–126)
ALT: 25 U/L (ref 17–63)
AST: 29 U/L (ref 15–41)
Anion gap: 8 (ref 5–15)
BILIRUBIN TOTAL: 0.3 mg/dL (ref 0.3–1.2)
BUN: 10 mg/dL (ref 6–20)
CALCIUM: 9.6 mg/dL (ref 8.9–10.3)
CO2: 24 mmol/L (ref 22–32)
Chloride: 106 mmol/L (ref 101–111)
Creatinine, Ser: 0.6 mg/dL — ABNORMAL LOW (ref 0.61–1.24)
GFR calc Af Amer: >60 mL/min (ref >60)
GFR calc non Af Amer: >60 mL/min (ref >60)
GLUCOSE: 123 mg/dL — AB (ref 65–99)
Potassium: 3.7 mmol/L (ref 3.5–5.1)
Sodium: 138 mmol/L (ref 135–145)
TOTAL PROTEIN: 8 g/dL (ref 6.5–8.1)

## 2015-10-03 LAB — SALICYLATE LEVEL

## 2015-10-03 LAB — ACETAMINOPHEN LEVEL
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
## 2015-10-03 NOTE — ED Notes (Signed)
Pt given cup of water 

## 2015-10-03 NOTE — ED Notes (Signed)

## 2015-10-03 NOTE — ED Notes (Signed)
BEHAVIORAL HEALTH ROUNDING Patient sleeping: No. Patient alert and oriented: yes Behavior appropriate: Yes.  ; If no, describe:  Nutrition and fluids offered: yes Toileting and hygiene offered: Yes  Sitter present: q15 minute observations and security  monitoring Law enforcement present: Yes  ODS  
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
## 2015-10-04 DIAGNOSIS — F29 Unspecified psychosis not due to a substance or known physiological condition: Secondary | ICD-10-CM | POA: Diagnosis not present

## 2015-10-04 LAB — URINE DRUG SCREEN, QUALITATIVE (ARMC ONLY)
AMPHETAMINES, UR SCREEN: NOT DETECTED
BENZODIAZEPINE, UR SCRN: NOT DETECTED
Barbiturates, Ur Screen: NOT DETECTED
Cannabinoid 50 Ng, Ur ~~LOC~~: NOT DETECTED
Cocaine Metabolite,Ur ~~LOC~~: NOT DETECTED
MDMA (ECSTASY) UR SCREEN: NOT DETECTED
METHADONE SCREEN, URINE: NOT DETECTED
Opiate, Ur Screen: NOT DETECTED
Phencyclidine (PCP) Ur S: NOT DETECTED
TRICYCLIC, UR SCREEN: NOT DETECTED

## 2015-10-04 NOTE — ED Notes (Signed)
BEHAVIORAL HEALTH ROUNDING Patient sleeping: No. Patient alert and oriented: yes Behavior appropriate: Yes.  ; If no, describe:  Nutrition and fluids offered: Yes  Toileting and hygiene offered: Yes  Sitter present: not applicable Law enforcement present: Yes  

## 2015-10-04 NOTE — Discharge Instructions (Signed)
Psychosis °Psychosis refers to a severe loss of contact with reality. During a psychotic episode, a person is not able to think clearly, and his or her emotions and responses do not match up with what is actually happening. Someone may have false beliefs about what is happening or who they are (delusions). Someone may see, hear, taste, smell, or feel things that are not present (hallucinations).  °Psychosis usually occurs with very serious mental health (psychiatric) conditions such as schizophrenia, bipolar disorder, or major depression. It can sometimes also be the result of drug use or certain medical conditions. °SYMPTOMS °Symptoms of a psychotic episode include: °· Delusions, such as: °¨ Feeling excessive fear or suspicion (paranoia). °¨ Believing something that is odd, unrealistic, or false, such as having a false belief about being someone else. °· Hallucinations. °· Disorganized thinking, such as thoughts that jump from one to another that do not make sense to others. °· Disorganized speech, such as saying things that do not make sense to others. °· Inappropriate behavior, such as talking to oneself or intruding on unfamiliar people. °DIAGNOSIS °A diagnosis of psychosis is made through an assessment by a health care provider, who will ask questions about thoughts, feelings, behavior, drug use, and medical conditions. The health care provider may also do one or more of the following: °· Physical exam. °· Blood tests. °· Brain imaging, such as a CT scan or MRI. °· Brain wave study (EEG). °The health care provider may make a referral for further evaluation by a mental health professional. °TREATMENT  °Treatment depends on the cause of the psychosis. Treatment may include one or more of the following: °· Monitoring and supportive care in the emergency room or hospital.   °· Taking medicines (antipsychotic medicine) to reduce symptoms and to balance chemicals in the brain. °· Treating an underlying medical  condition. °· Stopping or reducing drugs that are causing psychosis. °· Therapy and other supportive programs outside of the hospital. °HOME CARE INSTRUCTIONS °· Over-the-counter and prescription medicines should be taken only as told by the health care provider. °· The health care provider should be consulted before over-the-counter medicines, herbs, or supplements are used. °· All follow-up visits should be kept as told by the health care provider. This is important. °· A healthy lifestyle should be maintained. This includes: °¨ Eating a healthy diet. °¨ Getting enough sleep. °¨ Exercising regularly. °¨ Avoiding alcohol and recreational drugs as told by the health care provider. °SEEK MEDICAL CARE IF: °· Medicines do not seem to be helping. °· The person hears voices telling him or her to do things. °· The person continues to see, smell, or feel things that are not there. °· The person feels extremely fearful and suspicious that someone or something will harm him or her. °· The person feels unable to leave his or her house. °· The person has trouble taking care of himself or herself. °· The person experiences side effects of medicines, such as: °¨ Changes in sleep patterns. °¨ Dizziness. °¨ Weight gain. °¨ Restlessness. °¨ Movement changes. °¨ Muscle spasms. °¨ Tremors. °SEEK IMMEDIATE MEDICAL CARE IF: °· Serious thoughts occur about self-harm or about hurting others. °· There are serious side effects of medicine, such as: °¨ Swelling of the face, lips, tongue, or throat. °¨ Fever, confusion, muscle spasms, or seizures. °  °This information is not intended to replace advice given to you by your health care provider. Make sure you discuss any questions you have with your health care provider. °  °  Document Released: 10/12/2009 Document Revised: 09/08/2014 Document Reviewed: 04/28/2014 °Elsevier Interactive Patient Education ©2016 [employer] ° °

## 2015-10-04 NOTE — ED Notes (Signed)
Pt. Currently doing SOC in room.

## 2015-10-04 NOTE — ED Notes (Signed)

## 2015-10-04 NOTE — ED Provider Notes (Signed)
-----------------------------------------   1:55 AM on 10/04/2015 -----------------------------------------  [facility]  ----------------------------------------- 6:30 AM on 10/04/2015 -----------------------------------------   Blood pressure 149/98, pulse 85, temperature 98.2 F (36.8 C), temperature source Oral, resp. rate 16, height 5\' 7"  (1.702 m), weight 180 lb (81.647 kg), SpO2 97 %.  The patient had no acute events since last update.  Calm and cooperative at this time.  Disposition is pending per Psychiatry/Behavioral Medicine team recommendations.     Irean HongJade J Sung, MD 10/04/15 0630
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
## 2015-10-04 NOTE — ED Provider Notes (Signed)
-----------------------------------------   3:13 PM on 10/04/2015 -----------------------------------------   Blood pressure 135/92, pulse 90, temperature 98.2 F (36.8 C), temperature source Oral, resp. rate 16, height 5\' 7"  (1.702 m), weight 180 lb (81.647 kg), SpO2 98 %.  Assuming care from Dr. Fortunato CurlingJung.  In short, David Santos is a 30 y.o. male with a chief complaint of Psychiatric Evaluation .  Refer to the original H&P for additional details.  The current plan of care is to  Discharge the patient home. Patient was seen and evaluated by psychiatry and felt to be stable for outpatient psychiatric management. Has appropriate discharge paperwork arranged.   Jennye MoccasinBrian S Gerritt Galentine, MD 10/04/15 [phone]

## 2015-10-04 NOTE — ED Notes (Signed)
Patient sitting up in bed watching TV, smiling, denies needs at this time.

## 2015-10-04 NOTE — ED Notes (Signed)
Aviva KluverBrenda Breeze called to ask about pt.  Stated she wanted to be informed of inpatient location when found.  [phone].

## 2015-10-16 ENCOUNTER — Emergency Department
Admission: EM | Admit: 2015-10-16 | Discharge: 2015-10-20 | Disposition: A | Discharge status: Home / Self Care | Payer: Medicare Other | Attending: Emergency Medicine | Admitting: Emergency Medicine

## 2015-10-16 ENCOUNTER — Encounter: Payer: Self-pay | Admitting: Emergency Medicine

## 2015-10-16 DIAGNOSIS — Z7951 Long term (current) use of inhaled steroids: Secondary | ICD-10-CM | POA: Insufficient documentation

## 2015-10-16 DIAGNOSIS — E785 Hyperlipidemia, unspecified: Secondary | ICD-10-CM | POA: Diagnosis not present

## 2015-10-16 DIAGNOSIS — F71 Moderate intellectual disabilities: Secondary | ICD-10-CM | POA: Diagnosis not present

## 2015-10-16 DIAGNOSIS — F919 Conduct disorder, unspecified: Secondary | ICD-10-CM | POA: Insufficient documentation

## 2015-10-16 DIAGNOSIS — F840 Autistic disorder: Secondary | ICD-10-CM | POA: Insufficient documentation

## 2015-10-16 DIAGNOSIS — F2081 Schizophreniform disorder: Secondary | ICD-10-CM

## 2015-10-16 DIAGNOSIS — I10 Essential (primary) hypertension: Secondary | ICD-10-CM | POA: Diagnosis not present

## 2015-10-16 DIAGNOSIS — Z79899 Other long term (current) drug therapy: Secondary | ICD-10-CM | POA: Diagnosis not present

## 2015-10-16 DIAGNOSIS — Z046 Encounter for general psychiatric examination, requested by authority: Secondary | ICD-10-CM | POA: Diagnosis present

## 2015-10-16 DIAGNOSIS — R4689 Other symptoms and signs involving appearance and behavior: Secondary | ICD-10-CM

## 2015-10-16 LAB — URINE DRUG SCREEN, QUALITATIVE (ARMC ONLY)
AMPHETAMINES, UR SCREEN: NOT DETECTED
BENZODIAZEPINE, UR SCRN: POSITIVE — AB
Barbiturates, Ur Screen: NOT DETECTED
Cannabinoid 50 Ng, Ur ~~LOC~~: NOT DETECTED
Cocaine Metabolite,Ur ~~LOC~~: NOT DETECTED
MDMA (Ecstasy)Ur Screen: NOT DETECTED
METHADONE SCREEN, URINE: NOT DETECTED
Opiate, Ur Screen: NOT DETECTED
Phencyclidine (PCP) Ur S: NOT DETECTED
Tricyclic, Ur Screen: NOT DETECTED

## 2015-10-16 LAB — COMPREHENSIVE METABOLIC PANEL
ALBUMIN: 4.1 g/dL (ref 3.5–5.0)
ALT: 25 U/L (ref 17–63)
ANION GAP: 8 (ref 5–15)
AST: 26 U/L (ref 15–41)
Alkaline Phosphatase: 47 U/L (ref 38–126)
BILIRUBIN TOTAL: 0.3 mg/dL (ref 0.3–1.2)
BUN: 14 mg/dL (ref 6–20)
CHLORIDE: 104 mmol/L (ref 101–111)
CO2: 25 mmol/L (ref 22–32)
Calcium: 9.9 mg/dL (ref 8.9–10.3)
Creatinine, Ser: 0.71 mg/dL (ref 0.61–1.24)
GFR calc Af Amer: >60 mL/min (ref >60)
GFR calc non Af Amer: >60 mL/min (ref >60)
GLUCOSE: 110 mg/dL — AB (ref 65–99)
POTASSIUM: 4 mmol/L (ref 3.5–5.1)
SODIUM: 137 mmol/L (ref 135–145)
TOTAL PROTEIN: 7.6 g/dL (ref 6.5–8.1)

## 2015-10-16 LAB — CBC
HEMATOCRIT: 42.5 % (ref 40.0–52.0)
Hemoglobin: 14.2 g/dL (ref 13.0–18.0)
MCH: 29.5 pg (ref 26.0–34.0)
MCHC: 33.5 g/dL (ref 32.0–36.0)
MCV: 88.1 fL (ref 80.0–100.0)
PLATELETS: 240 10*3/uL (ref 150–440)
RBC: 4.82 MIL/uL (ref 4.40–5.90)
RDW: 13.4 % (ref 11.5–14.5)
WBC: 7.3 10*3/uL (ref 3.8–10.6)

## 2015-10-16 LAB — URINALYSIS COMPLETE WITH MICROSCOPIC (ARMC ONLY)
BILIRUBIN URINE: NEGATIVE
Glucose, UA: NEGATIVE mg/dL
HGB URINE DIPSTICK: NEGATIVE
KETONES UR: NEGATIVE mg/dL
LEUKOCYTES UA: NEGATIVE
Nitrite: NEGATIVE
PH: 7 (ref 5.0–8.0)
Protein, ur: NEGATIVE mg/dL
Specific Gravity, Urine: 1.024 (ref 1.005–1.030)
Squamous Epithelial / LPF: NONE SEEN

## 2015-10-16 LAB — SALICYLATE LEVEL: Salicylate Lvl: <4 mg/dL (ref 2.8–30.0)

## 2015-10-16 LAB — ETHANOL: Alcohol, Ethyl (B): <5 mg/dL (ref <5)

## 2015-10-16 LAB — ACETAMINOPHEN LEVEL

## 2015-10-16 MED ORDER — RISPERIDONE 1 MG PO TABS
1.0000 mg | ORAL_TABLET | Freq: Every morning | ORAL | Status: DC
  Administered 2015-10-17 – 2015-10-20 (×4): 1 mg via ORAL
  Filled 2015-10-16 (×4): qty 1

## 2015-10-16 MED ORDER — ACETAMINOPHEN 325 MG PO TABS
650.0000 mg | ORAL_TABLET | Freq: Four times a day (QID) | ORAL | Status: DC | PRN
  Administered 2015-10-16: 650 mg via ORAL

## 2015-10-16 MED ORDER — TOPIRAMATE 25 MG PO TABS
50.0000 mg | ORAL_TABLET | Freq: Every day | ORAL | Status: DC
  Administered 2015-10-16 – 2015-10-19 (×4): 50 mg via ORAL
  Filled 2015-10-16 (×6): qty 2

## 2015-10-16 MED ORDER — DIVALPROEX SODIUM 125 MG PO CSDR
500.0000 mg | DELAYED_RELEASE_CAPSULE | Freq: Every day | ORAL | Status: DC
  Administered 2015-10-16 – 2015-10-19 (×4): 500 mg via ORAL
  Filled 2015-10-16 (×6): qty 4

## 2015-10-16 MED ORDER — ACETAMINOPHEN 325 MG PO TABS
ORAL_TABLET | ORAL | Status: AC
  Filled 2015-10-16: qty 2

## 2015-10-16 MED ORDER — CLONAZEPAM 0.5 MG PO TABS
0.5000 mg | ORAL_TABLET | Freq: Two times a day (BID) | ORAL | Status: DC
  Administered 2015-10-16 – 2015-10-20 (×9): 0.5 mg via ORAL
  Filled 2015-10-16 (×8): qty 1

## 2015-10-16 MED ORDER — CLONAZEPAM 0.5 MG PO TABS
ORAL_TABLET | ORAL | Status: AC
  Administered 2015-10-16: 0.5 mg via ORAL
  Filled 2015-10-16: qty 1

## 2015-10-16 MED ORDER — RISPERIDONE 1 MG PO TABS
3.0000 mg | ORAL_TABLET | Freq: Every day | ORAL | Status: DC
  Administered 2015-10-16 – 2015-10-18 (×3): 3 mg via ORAL
  Filled 2015-10-16 (×3): qty 3

## 2015-10-16 NOTE — ED Notes (Addendum)
Per mom he has been here for eval a couple of times and always send him back home. She states she is sleeping in her car for the past 3 weeks. Pt is [facility] mom still thinks he is hearing voices pt denies at present

## 2015-10-16 NOTE — ED Notes (Signed)
Report to Amy RN

## 2015-10-16 NOTE — Progress Notes (Signed)
.  Referral information for have been faxed to;     [facility] [phone]),    [facility] [phone]),    [facility] [phone]),    [facility] [phone])   [facility] [phone]),     10/16/2015 Cheryl FlashNicole Marrian Bells, MS, NCC, [facility] Therapeutic Triage Specialist
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
## 2015-10-16 NOTE — ED Notes (Signed)
Patient alert, has been pacing, but Patient is calm, smiles often, Patient with supper tray, patient talks very slow, and very slow to respond to questions. Patient denies Si/hi and avh at this time, but Patient did tel TTS Joni Reiningicole that He was hearing voices and they were evil, Patient does take medicines and is compliant. Patient is safe, no acting out. q 15 min. Checks and surveillance cameras in progress.

## 2015-10-16 NOTE — ED Notes (Signed)
Pt is IVC 
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
## 2015-10-16 NOTE — ED Notes (Signed)
Patient has starting pacing, Patient ask " can I get my clothes" nurse let him know that it was not time to have clothing and that He needed to stay in scrubs while He was here, and He agreed. Patient denies being agitated, but is talking to himself, and when ask who he is talking to he said" no one" Patient being monitored, q 15 min. Checks and camera surveillance 24 hours.
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
## 2015-10-16 NOTE — ED Notes (Signed)
Patient is safe, no behavior problems, patient with q 15 min. Checks, and camera surveillance.

## 2015-10-16 NOTE — ED Notes (Addendum)
Dr Garnetta BuddyFaheem at bedside

## 2015-10-16 NOTE — BHH Counselor (Signed)
Pt denied by [facility] for inpatient Tx - no programming for MR diagnosis

## 2015-10-17 DIAGNOSIS — F919 Conduct disorder, unspecified: Secondary | ICD-10-CM | POA: Diagnosis not present

## 2015-10-17 LAB — PROLACTIN: Prolactin: 55.4 ng/mL — ABNORMAL HIGH (ref 4.0–15.2)

## 2015-10-17 NOTE — ED Notes (Signed)
Pt sitting in dayroom laughing and talking by himself. Pt does not seem to be agitated. No distress noted. Maintained on 15 minute checks and observation by security camera for safety.

## 2015-10-17 NOTE — ED Notes (Signed)
Patient watching television in dayroom. In no apparent distress. Still appears to be responding to internal stimuli.

## 2015-10-17 NOTE — ED Notes (Signed)
Pt pacing from room to dayroom. Continues to talk and laugh at what seems to be internal stimuli. Pt is not agitated. No distress noted. Maintained on 15 minute checks and observation by security camera for safety.

## 2015-10-17 NOTE — Progress Notes (Signed)
Referral information for MR/DD Placement have been faxed to;    [facility]    [facility]    [place] [phone]),    [facility]   [facility]   [place] [place]  10/17/2015 Cheryl FlashNicole Kattaleya Alia, MS, NCC, LPCA Therapeutic Triage Specialist

## 2015-10-17 NOTE — ED Notes (Signed)
Patient currently in the dayroom. No aggression noted, calm and cooperative. Affect is bright and cheerful. Maintained on 15 minute checks and observation by security camera for safety.

## 2015-10-17 NOTE — ED Notes (Signed)
Snack tray without utensils given at this time. 

## 2015-10-17 NOTE — ED Notes (Signed)
Patient asleep in room. No noted distress or abnormal behavior. Will continue 15 minute checks and observation by security cameras for safety. 

## 2015-10-17 NOTE — ED Notes (Signed)
Pt's mother called to check on her son. RN explained the pt has been referred to several facilities and we are waiting to hear back. Mother accepting. Pt's mother stated she has been feeling depressed and stressed so she is staying with her mother. This is the number where she can be reached if needed: [phone].

## 2015-10-17 NOTE — ED Notes (Signed)
Patient received supper tray. 
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
## 2015-10-17 NOTE — ED Notes (Signed)
Pt compliant with medications. Pt  pacing from room to dayroom, but is not agitated at this time. Pt is laughing and talking as he walks.  No distress noted. Maintained on 15 minute checks and observation by security camera for safety.

## 2015-10-17 NOTE — ED Notes (Signed)
Patient out in the dayroom. He appears to be responding to internal stimuli but he denies this. He occasionally makes strange sounds ( cat meow.) No aggression. Affect is bright and cheerful. Maintained on 15 minute checks and observation by security camera for safety.

## 2015-10-18 DIAGNOSIS — F2081 Schizophreniform disorder: Secondary | ICD-10-CM | POA: Diagnosis not present

## 2015-10-18 DIAGNOSIS — F919 Conduct disorder, unspecified: Secondary | ICD-10-CM | POA: Diagnosis not present

## 2015-10-18 NOTE — ED Notes (Addendum)
Discussed with pt that he will be here for the night ,he was agreeable to plan. His behavior has been very  appropriate . He at times talks or mumbles to himself quietly it is not disturbing to other pts, he ate all 3 meals and snacks and fluids ,he has taken his medication without any problems he has no c/o pain,he still does c/o he hears some voices but they seem better as in general he is calmer.

## 2015-10-18 NOTE — ED Provider Notes (Signed)
-----------------------------------------   8:30 AM on 10/18/2015 -----------------------------------------   Blood pressure 124/79, pulse 91, temperature 97.9 F (36.6 C), temperature source Oral, resp. rate 16, height 5\' 5"  (1.651 m), weight 200 lb (90.719 kg), SpO2 97 %.  The patient had no acute events since last update.  Calm and cooperative at this time.    Patient needs to be admitted inpatient and is awaiting acceptance to another facility.     Rebecka ApleyAllison P Nadav Swindell, MD 10/18/15 0830
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
## 2015-10-18 NOTE — Consult Note (Signed)
  Psychiatry: Patient will not be admitted to the psychiatry ward because of his history of autism. Decision made by treatment team downstairs. Patient's behavior renders him too dangerous to be discharged at this point. Unclear what facility he could be referred to. Continue evaluation here in the emergency room.

## 2015-10-18 NOTE — ED Notes (Signed)
i was informed that pt can not be adm to this hospital he doesn't meet criteria , so pt is being reffered elsewhere ,pt informed

## 2015-10-18 NOTE — ED Notes (Signed)

## 2015-10-18 NOTE — ED Notes (Signed)
pts mother called inquiring if a group home staff had come to interview pt , he has not been here today. UI informed his mother that we have not found an admission bed for him yet, and that he will be here for the night

## 2015-10-18 NOTE — ED Notes (Signed)
Dr clapacs here to see pt 

## 2015-10-18 NOTE — ED Notes (Signed)
Pt made aware that a group home staff is coming here to interview him for possible placement

## 2015-10-18 NOTE — ED Notes (Signed)
Pt is aware he is waiting to be adm to [facility] he is agreeable to the plan

## 2015-10-18 NOTE — ED Notes (Signed)

## 2015-10-18 NOTE — ED Notes (Signed)
Sandwich and soft drink given.  

## 2015-10-18 NOTE — ED Notes (Addendum)
Pt. Alert and oriented, warm and dry, in no distress. Pt. Denies SI, HI, and AVH. Pt is pacing in the dayroom talking to self. Pt. Encouraged to let nursing staff know of any concerns or needs.

## 2015-10-18 NOTE — ED Notes (Signed)
Patient remains calm and cooperative, no issues to report at this time. Patient denies SI, HI.Security cameras and Q15 minutes monitoring continues. 

## 2015-10-19 DIAGNOSIS — F919 Conduct disorder, unspecified: Secondary | ICD-10-CM | POA: Diagnosis not present

## 2015-10-19 MED ORDER — RISPERIDONE 3 MG PO TABS
4.0000 mg | ORAL_TABLET | Freq: Every day | ORAL | Status: DC
  Administered 2015-10-19: 4 mg via ORAL
  Filled 2015-10-19: qty 1

## 2015-10-19 NOTE — ED Notes (Signed)
Reviewed with pt that we are still looking for placement

## 2015-10-19 NOTE — ED Notes (Addendum)
Pt to NS for water, given per request, pt asked for clothes "my grandma here to get me", pt reoriented to POC and situation, pt indicated understanding, and pt returned to bed

## 2015-10-19 NOTE — Progress Notes (Signed)
LCSW called patients mother and left a message requesting David Santos contact information. LCSW awaiting call back.  [facility] and specializes in autistic patients.  [employer] Air LinesClaudine Marqus Macphee LCSW [phone]

## 2015-10-19 NOTE — BH Assessment (Signed)
Per [facility]aren at [facility] - the patient was declined due to a diagnosis of IDD/Autism.    [facility] they do not have any IDD beds open.    TTS will continue to seek placement at other facilities that serve IDD patients. Writer informed the RN (Ruthie).  Dr. Toni Amendlapacs will also see the patient today.
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
## 2015-10-19 NOTE — BH Specialist Note (Signed)
TTS refaxed referral packet to [id number]
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
## 2015-10-19 NOTE — Discharge Instructions (Signed)
Return to the emergency department for thoughts of hurting yourself or anyone else, hallucinations, or any other symptoms concerning to you.

## 2015-10-19 NOTE — Consult Note (Signed)
  Psychiatry:Brief note. Follow-up note later.. We have been unsuccessful in finding disposition for this patient. Here in the emergency room had a has been calm and cooperative with no acutely dangerous behavior. Patient does not clearly need hospital level treatment at this point. I have discontinue the IVC. Family continues to feel that he is unsafe to come home because of his dangerous behavior there. Social work is being asked to get involved to see if we can find any other kind of disposition for him.

## 2015-10-19 NOTE — Progress Notes (Signed)
Spoke to patients mother Ms David Santos and [name] has accepted patient but will not be able to pick patient up til tomorrow. LCSW explained to nurse.  [employer] LinesClaudine Urie Loughner LCSW [phone]

## 2015-10-19 NOTE — ED Notes (Signed)
ED BHU PLACEMENT JUSTIFICATION Is the patient under IVC or is there intent for IVC: No. Is the patient medically cleared: Yes.   Is there vacancy in the ED BHU: Yes.   Is the population mix appropriate for patient: Yes.   Is the patient awaiting placement in inpatient or outpatient setting: Yes.   Has the patient had a psychiatric consult: Yes.   Survey of unit performed for contraband, proper placement and condition of furniture, tampering with fixtures in bathroom, shower, and each patient room: Yes.  ; Findings: all clear  APPEARANCE/BEHAVIOR calm, cooperative and adequate rapport can be established NEURO ASSESSMENT Orientation: time, place and person Hallucinations: No.None noted (Hallucinations) Speech: Normal Gait: normal RESPIRATORY ASSESSMENT Normal expansion.  Clear to auscultation.  No rales, rhonchi, or wheezing. CARDIOVASCULAR ASSESSMENT regular rate and rhythm, S1, S2 normal, no murmur, click, rub or gallop GASTROINTESTINAL ASSESSMENT soft, nontender, BS WNL, no r/g EXTREMITIES normal strength, tone, and muscle mass PLAN OF CARE Provide calm/safe environment. Vital signs assessed twice daily. ED BHU Assessment once each 12-hour shift. Collaborate with intake RN daily or as condition indicates. Assure the ED provider has rounded once each shift. Provide and encourage hygiene. Provide redirection as needed. Assess for escalating behavior; address immediately and inform ED provider.  Assess family dynamic and appropriateness for visitation as needed: Yes.  ; If necessary, describe findings: NA Educate the patient/family about BHU procedures/visitation: Yes.  ; If necessary, describe findings: NA

## 2015-10-19 NOTE — ED Notes (Signed)
Call placed by social work to a [employer] [facility] [employer]ron Santos who has a group home and he says he can make a bed for him tomomrow
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
## 2015-10-19 NOTE — ED Notes (Signed)
It is my  Understanding that tomowrow a man named David Santos who supervisors a  group home his phone # is in social work note is supposed to be having a bed open up and he will come and get the pt.

## 2015-10-19 NOTE — Progress Notes (Signed)
LCSW called Clinton SawyerByron White-- [phone] left detailed message requesting and awaiting a call back.  Delta Air LinesClaudine Jared Whorley LCSW [phone]
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
## 2015-10-19 NOTE — ED Notes (Signed)
Pt continues to pace and has some innapropriate laughter he states he is hearing music,still is no behavior  Problem.

## 2015-10-19 NOTE — ED Notes (Signed)
Pt ambulatory to toilet without difficulty. 

## 2015-10-19 NOTE — ED Provider Notes (Signed)
-----------------------------------------   7:01 AM on 10/19/2015 -----------------------------------------   Blood pressure 140/95, pulse 87, temperature 97.6 F (36.4 C), temperature source Oral, resp. rate 16, height 5\' 5"  (1.651 m), weight 200 lb (90.719 kg), SpO2 99 %.  The patient had no acute events since last update.  Calm and cooperative at this time.  Disposition is pending per Psychiatry/Behavioral Medicine team recommendations.     Irean HongJade J Sung, MD 10/19/15 [phone]

## 2015-10-19 NOTE — BH Assessment (Signed)
Per Dr. Toni Amendlapacs, the patient can discharged home.  CSW will assist with placement.

## 2015-10-19 NOTE — ED Notes (Addendum)
Called placed to mother Aviva KluverBrenda Breeze she states she will pick him up later but first is going to see if a group home would pick him up as a new client,she has to wait for parents to drive her

## 2015-10-20 DIAGNOSIS — F919 Conduct disorder, unspecified: Secondary | ICD-10-CM | POA: Diagnosis not present

## 2015-10-20 MED ORDER — DIVALPROEX SODIUM 125 MG PO CSDR: 500.0000 mg | DELAYED_RELEASE_CAPSULE | Freq: Every day | ORAL | Status: DC

## 2015-10-20 MED ORDER — CLONAZEPAM 0.5 MG PO TABS: 0.5000 mg | ORAL_TABLET | Freq: Two times a day (BID) | ORAL | Status: DC

## 2015-10-20 MED ORDER — RISPERIDONE 1 MG PO TABS: 1.0000 mg | ORAL_TABLET | Freq: Every day | ORAL | Status: DC

## 2015-10-20 MED ORDER — RISPERIDONE 4 MG PO TABS: 4.0000 mg | ORAL_TABLET | Freq: Every day | ORAL | Status: DC

## 2015-10-20 MED ORDER — TOPIRAMATE 50 MG PO TABS: 50.0000 mg | ORAL_TABLET | Freq: Every day | ORAL | Status: DC

## 2015-10-20 NOTE — ED Notes (Signed)
Patient resting quietly in room in dayroom. No noted distress or abnormal behaviors noted. Will continue 15 minute checks and observation by security camera for safety.

## 2015-10-20 NOTE — ED Notes (Signed)
Patient resting quietly in day room. No noted distress or abnormal behaviors noted. Will continue 15 minute checks and observation by security camera for safety. 

## 2015-10-20 NOTE — ED Notes (Signed)
Patient currently resting in room. Patient still awaits pickup. No signs of acute distress noted. Maintained on 15 minute checks and observation by security camera for safety.

## 2015-10-20 NOTE — ED Notes (Signed)
Patient denies SI/HI and pain. Patient continuously paces the dayroom, laughing to himself and clearly responding to internal stimuli. Patient is cooperative and pleasant, no signs of acute distress noted at this time. Patient notified of transfer to group home today and patient was agreeable to the plan. Will continue to monitor. Maintained on 15 minute checks and observation by security camera for safety.

## 2015-10-20 NOTE — ED Notes (Signed)
Called [facility] x1 to ask about ETA to the hospital for patient pickup. No answer. Left a HIPAA compliant voicemail.

## 2015-10-20 NOTE — ED Notes (Signed)
Writer called David SawyerByron Santos to ask about time for patient pickup from the hospital. David KickByron stated that he would be able to be picked up sometime this afternoon between 1pm and 4:30 pm and confirmed that he has a group home bed.

## 2015-10-20 NOTE — ED Notes (Signed)
Patient resting quietly in room dayroom. No noted distress or abnormal behaviors noted. Will continue 15 minute checks and observation by security camera for safety.

## 2015-10-20 NOTE — ED Notes (Signed)
Patient currently in dayroom resting. No signs of acute distress noted at this time. Maintained on 15 minute checks and observation by security camera for safety.

## 2015-10-20 NOTE — NC FL2 (Signed)
[place] MEDICAID FL2 LEVEL OF CARE SCREENING TOOL     IDENTIFICATION  Patient Name: David Santos Birthdate: 10-04-1985 Sex: male Admission Date (Current Location): 10/16/2015  [place] Number:  Chiropodist and Address:  [facility], [address], [place], [place] [address]      Provider Number: [id number]  Attending Physician Name and Address:  No att. providers found  Relative Name and Phone Number:  [name] (mother) [phone]    Current Level of Care: Hospital Recommended Level of Care: Other (Comment) (Group home ) Prior Approval Number:    Date Approved/Denied:   PASRR Number:    Discharge Plan: Other (Comment) (Group home)    Current Diagnoses: Patient Active Problem List   Diagnosis Date Noted  . Schizophreniform disorder (HCC) 10/18/2015  . Autism spectrum disorder   . Dyslipidemia 06/28/2015  . Active autistic disorder 10/25/2014  . Benign essential hypertension 10/25/2014  . Chronic insomnia 10/25/2014  . History of acute renal failure 10/25/2014  . Dysmetabolic syndrome 10/25/2014  . Moderate mental retardation 10/25/2014  . Perennial allergic rhinitis 10/25/2014  . Obese 10/25/2014  . General psychoses 10/25/2014  . Seborrhea capitis 10/25/2014  . Vitamin D deficiency 01/15/2009  . Testicular hypofunction 07/17/2008    Orientation RESPIRATION BLADDER Height & Weight     Self, Time, Situation, Place  Normal Continent Weight: 200 lb (90.719 kg) Height:   (165.1 cm)  BEHAVIORAL SYMPTOMS/MOOD NEUROLOGICAL BOWEL NUTRITION STATUS    None  Continent  Normal  AMBULATORY STATUS COMMUNICATION OF NEEDS Skin   Independent Verbally Normal                       Personal Care Assistance Level of Assistance  Bathing, Feeding, Dressing, Total care Bathing Assistance: Independent Feeding assistance: Independent Dressing Assistance: Independent Total Care Assistance: Independent    Functional Limitations Info  Sight, Hearing, Speech Sight Info: Adequate Hearing Info: Adequate Speech Info: Adequate    SPECIAL CARE FACTORS FREQUENCY   None                    Contractures Contractures Info: Not present    Additional Factors Info                  Current Medications (10/20/2015):  This is the current hospital active medication list Current Facility-Administered Medications  Medication Dose Route Frequency Provider Last Rate Last Dose  . acetaminophen (TYLENOL) tablet 650 mg  650 mg Oral Q6H PRN Sharyn Creamer, MD   650 mg at 10/16/15 1955  . clonazePAM (KLONOPIN) tablet 0.5 mg  0.5 mg Oral BID Brandy Hale, MD   0.5 mg at 10/20/15 1029  . divalproex (DEPAKOTE SPRINKLE) capsule 500 mg  500 mg Oral QHS Brandy Hale, MD   500 mg at 10/19/15 2145  . risperiDONE (RISPERDAL) tablet 1 mg  1 mg Oral q morning - 10a Brandy Hale, MD   1 mg at 10/20/15 1029  . risperiDONE (RISPERDAL) tablet 4 mg  4 mg Oral QHS Audery Amel, MD   4 mg at 10/19/15 2145  . topiramate (TOPAMAX) tablet 50 mg  50 mg Oral QHS Brandy Hale, MD   50 mg at 10/19/15 2145   Current Outpatient Prescriptions  Medication Sig Dispense Refill  . clonazePAM (KLONOPIN) 1 MG tablet Take 1 mg by mouth 2 (two) times daily.     . diphenhydrAMINE (BENADRYL) 50 MG capsule Take 50  mg by mouth as needed.    . divalproex (DEPAKOTE ER) 500 MG 24 hr tablet TK 2 TS PO AT 8 PM  0  . fluticasone (FLONASE) 50 MCG/ACT nasal spray Place 2 sprays into both nostrils daily. 16 g 2  . lisinopril (PRINIVIL,ZESTRIL) 20 MG tablet Take 1 tablet (20 mg total) by mouth daily. 90 tablet 1  . risperiDONE (RISPERDAL) 1 MG tablet TK 1 T PO QAM  0  . risperidone (RISPERDAL) 4 MG tablet TK 1 T PO HS  0     Discharge Medications: Please see discharge summary for a list of discharge medications.  Relevant Imaging Results:  Relevant Lab Results:   Additional Information  SSN: [id number]  Sempra EnergyCandace L Tula Schryver, LCSWA

## 2015-10-20 NOTE — ED Notes (Signed)
Patient in dayroom sitting in chair and watching television. No signs of acute distress noted at this time. Maintained on 15 minute checks and observation by security camera for safety.

## 2015-10-20 NOTE — ED Notes (Signed)
ENVIRONMENTAL ASSESSMENT Potentially harmful objects out of patient reach: Yes Personal belongings secured: Yes Patient dressed in hospital provided attire only: Yes Plastic bags out of patient reach: Yes Patient care equipment (cords, cables, call bells, lines, and drains) shortened, removed, or accounted for: Yes Equipment and supplies removed from bottom of stretcher: Yes Potentially toxic materials out of patient reach: Yes Sharps container removed or out of patient reach: Yes  Patient currently in dayroom resting. No signs of acute distress noted at this time. Maintained on 15 minute checks and observation by security camera for safety.

## 2015-10-20 NOTE — ED Notes (Signed)
Patient currently in dayroom resting. No signs of distress noted. Maintained on 15 minute checks and observation by security camera for safety.  

## 2015-10-20 NOTE — ED Notes (Signed)
[facility](Sally Cameron [phone]) stating that they were trying to get in touch with the patient's mother and that they had a group home that could possibly take the patient. Writer informed [facility] that arrangements had already been made by social work. Kennon RoundsSally was agreeable with stated plan.

## 2015-10-20 NOTE — ED Notes (Signed)
Report was received from Karena T., RN; Pt. Verbalizes no complaints or distress; denies S.I./Hi. Continue to monitor with 15 min. Monitoring. 

## 2015-10-27 ENCOUNTER — Ambulatory Visit: Payer: Medicare Other | Admitting: Family Medicine

## 2015-11-01 DIAGNOSIS — F840 Autistic disorder: Secondary | ICD-10-CM | POA: Diagnosis not present

## 2015-11-01 DIAGNOSIS — F29 Unspecified psychosis not due to a substance or known physiological condition: Secondary | ICD-10-CM | POA: Diagnosis not present

## 2015-12-02 ENCOUNTER — Telehealth: Payer: Self-pay | Admitting: Family Medicine

## 2015-12-02 ENCOUNTER — Other Ambulatory Visit: Payer: Self-pay | Admitting: Family Medicine

## 2015-12-02 DIAGNOSIS — I10 Essential (primary) hypertension: Secondary | ICD-10-CM

## 2015-12-02 DIAGNOSIS — J3089 Other allergic rhinitis: Secondary | ICD-10-CM

## 2015-12-02 MED ORDER — LISINOPRIL 20 MG PO TABS: 20.0000 mg | ORAL_TABLET | Freq: Every day | ORAL | 1 refills | Status: DC

## 2015-12-02 MED ORDER — FLUTICASONE PROPIONATE 50 MCG/ACT NA SUSP: 2.0000 | Freq: Every day | NASAL | 2 refills | Status: DC

## 2015-12-02 NOTE — Telephone Encounter (Signed)
done

## 2015-12-20 ENCOUNTER — Ambulatory Visit: Payer: Self-pay | Admitting: Family Medicine

## 2016-01-27 ENCOUNTER — Other Ambulatory Visit: Payer: Self-pay | Admitting: Family Medicine

## 2016-01-27 ENCOUNTER — Telehealth: Payer: Self-pay | Admitting: Family Medicine

## 2016-01-27 DIAGNOSIS — J3089 Other allergic rhinitis: Secondary | ICD-10-CM

## 2016-01-27 DIAGNOSIS — I10 Essential (primary) hypertension: Secondary | ICD-10-CM

## 2016-01-27 MED ORDER — FLUTICASONE PROPIONATE 50 MCG/ACT NA SUSP: 2.0000 | Freq: Every day | NASAL | 0 refills | Status: DC

## 2016-01-27 MED ORDER — LISINOPRIL 20 MG PO TABS: 20.0000 mg | ORAL_TABLET | Freq: Every day | ORAL | 0 refills | Status: DC

## 2016-01-27 NOTE — Telephone Encounter (Signed)
done

## 2016-01-28 NOTE — Telephone Encounter (Signed)
Called and left mother a voicemail notify her of appointment.

## 2016-02-07 ENCOUNTER — Ambulatory Visit (INDEPENDENT_AMBULATORY_CARE_PROVIDER_SITE_OTHER): Payer: Medicare Other | Admitting: Family Medicine

## 2016-02-07 ENCOUNTER — Other Ambulatory Visit: Payer: Self-pay | Admitting: Family Medicine

## 2016-02-07 ENCOUNTER — Encounter: Payer: Self-pay | Admitting: Family Medicine

## 2016-02-07 VITALS — BP 146/92 | HR 106 | Temp 98.9°F | Resp 18 | Ht 65.0 in | Wt 174.0 lb

## 2016-02-07 DIAGNOSIS — E8881 Metabolic syndrome: Secondary | ICD-10-CM | POA: Diagnosis not present

## 2016-02-07 DIAGNOSIS — R634 Abnormal weight loss: Secondary | ICD-10-CM

## 2016-02-07 DIAGNOSIS — E559 Vitamin D deficiency, unspecified: Secondary | ICD-10-CM

## 2016-02-07 DIAGNOSIS — F71 Moderate intellectual disabilities: Secondary | ICD-10-CM

## 2016-02-07 DIAGNOSIS — Z23 Encounter for immunization: Secondary | ICD-10-CM | POA: Diagnosis not present

## 2016-02-07 DIAGNOSIS — I10 Essential (primary) hypertension: Secondary | ICD-10-CM | POA: Diagnosis not present

## 2016-02-07 DIAGNOSIS — E785 Hyperlipidemia, unspecified: Secondary | ICD-10-CM

## 2016-02-07 DIAGNOSIS — F840 Autistic disorder: Secondary | ICD-10-CM

## 2016-02-07 DIAGNOSIS — R000 Tachycardia, unspecified: Secondary | ICD-10-CM | POA: Diagnosis not present

## 2016-02-07 DIAGNOSIS — R739 Hyperglycemia, unspecified: Secondary | ICD-10-CM

## 2016-02-07 LAB — LIPID PANEL
Cholesterol: 177 mg/dL (ref 125–200)
HDL: 37 mg/dL — ABNORMAL LOW (ref >=40)
LDL CALC: 111 mg/dL (ref <130)
TRIGLYCERIDES: 147 mg/dL (ref <150)
Total CHOL/HDL Ratio: 4.8 Ratio (ref <=5.0)
VLDL: 29 mg/dL (ref <30)

## 2016-02-07 LAB — CBC WITH DIFFERENTIAL/PLATELET
Basophils Absolute: 0 cells/uL (ref 0–200)
Basophils Relative: 0 %
Eosinophils Absolute: 171 cells/uL (ref 15–500)
Eosinophils Relative: 3 %
HEMATOCRIT: 43.9 % (ref 38.5–50.0)
Hemoglobin: 15.1 g/dL (ref 13.2–17.1)
LYMPHS PCT: 37 %
Lymphs Abs: 2109 cells/uL (ref 850–3900)
MCH: 30.1 pg (ref 27.0–33.0)
MCHC: 34.4 g/dL (ref 32.0–36.0)
MCV: 87.5 fL (ref 80.0–100.0)
MPV: 9 fL (ref 7.5–12.5)
Monocytes Absolute: 798 cells/uL (ref 200–950)
Monocytes Relative: 14 %
NEUTROS PCT: 46 %
Neutro Abs: 2622 cells/uL (ref 1500–7800)
Platelets: 253 10*3/uL (ref 140–400)
RBC: 5.02 MIL/uL (ref 4.20–5.80)
RDW: 13.8 % (ref 11.0–15.0)
WBC: 5.7 10*3/uL (ref 3.8–10.8)

## 2016-02-07 LAB — COMPLETE METABOLIC PANEL WITH GFR
ALT: 19 U/L (ref 9–46)
AST: 18 U/L (ref 10–40)
Albumin: 4.6 g/dL (ref 3.6–5.1)
Alkaline Phosphatase: 44 U/L (ref 40–115)
BILIRUBIN TOTAL: 0.4 mg/dL (ref 0.2–1.2)
BUN: 10 mg/dL (ref 7–25)
CALCIUM: 10 mg/dL (ref 8.6–10.3)
CO2: 30 mmol/L (ref 20–31)
CREATININE: 0.75 mg/dL (ref 0.60–1.35)
Chloride: 103 mmol/L (ref 98–110)
GFR, Est Non African American: >89 mL/min (ref >=60)
Glucose, Bld: 73 mg/dL (ref 65–99)
Potassium: 4.5 mmol/L (ref 3.5–5.3)
Sodium: 140 mmol/L (ref 135–146)
TOTAL PROTEIN: 7.8 g/dL (ref 6.1–8.1)

## 2016-02-07 LAB — TSH: TSH: 2.14 mIU/L (ref 0.40–4.50)

## 2016-02-07 NOTE — Progress Notes (Signed)
Name: David Santos   MRN: [id number]    DOB: 07-25-1985   Date:02/07/2016       Progress Note  Subjective  Chief Complaint  Chief Complaint  Patient presents with  . Medication Refill    4 month F/U  . Hypertension    Patient has been without his BP Medication for 3 days and needs refill.  . Metabolic Syndrome    Patient has losted 26 pounds since last visit due to change in diet. Patient eats 1 snack and 3 meals a day and his caregiver has limited sodas and candy to only twice a week.   . MR    HPI  Hypertension Benign: doing well on Lisinopril , off HCTZ because it caused acute renal failure. He is compliant with medication, living in a group home now. He is unable to self medicate. He denies chest pain or palpitation. BP is elevated today and also his HR.   Moderate MR: he is now at a group home ( caregiver did not come in with him and patient cannot give much information), he was admitted for psychosis in June and has been in a group home since.  He sees a psychiatrist.  Metabolic Syndrome:He is eating healthy since he has been placed in a group home. Lost 26 lbs since last visit. He has noticed  polyphagia, polydipsia or polyuria.   Dyslipidemia: discussed importance of changing his diet. We will recheck labs  Vitamin D deficiency: he feels tired we will recheck labs  Tachycardia: and weight loss: we will check TSH, likely from life style modification and he gets nervous when at doctor's visits  Patient Active Problem List   Diagnosis Date Noted  . Schizophreniform disorder (HCC) 10/18/2015  . Autism spectrum disorder   . Dyslipidemia 06/28/2015  . Active autistic disorder 10/25/2014  . Benign essential hypertension 10/25/2014  . Chronic insomnia 10/25/2014  . History of acute renal failure 10/25/2014  . Dysmetabolic syndrome 10/25/2014  . Moderate mental retardation 10/25/2014  . Perennial allergic rhinitis 10/25/2014  . Obese 10/25/2014  . General psychoses  10/25/2014  . Seborrhea capitis 10/25/2014  . Vitamin D deficiency 01/15/2009  . Testicular hypofunction 07/17/2008    Past Surgical History:  Procedure Laterality Date  . TONSILLECTOMY AND ADENOIDECTOMY      Family History  Problem Relation Age of Onset  . Hypertension Mother   . Cancer Mother   . Graves' disease Mother   . Mental illness Father     Social History   Social History  . Marital status: Single    Spouse name: N/A  . Number of children: N/A  . Years of education: N/A   Occupational History  . Not on file.   Social History Main Topics  . Smoking status: Never Smoker  . Smokeless tobacco: Never Used  . Alcohol use No  . Drug use: No  . Sexual activity: No   Other Topics Concern  . Not on file   Social History Narrative  . No narrative on file     Current Outpatient Prescriptions:  .  clonazePAM (KLONOPIN) 0.5 MG tablet, Take 1 tablet (0.5 mg total) by mouth 2 (two) times daily., Disp: 60 tablet, Rfl: 0 .  diphenhydrAMINE (BENADRYL) 50 MG capsule, Take 50 mg by mouth as needed., Disp: , Rfl:  .  divalproex (DEPAKOTE SPRINKLE) 125 MG capsule, Take 4 capsules (500 mg total) by mouth at bedtime., Disp: 120 capsule, Rfl: 0 .  fluticasone (FLONASE) 50 MCG/ACT  nasal spray, Place 2 sprays into both nostrils daily., Disp: 16 g, Rfl: 0 .  lisinopril (PRINIVIL,ZESTRIL) 20 MG tablet, Take 1 tablet (20 mg total) by mouth daily., Disp: 90 tablet, Rfl: 0 .  risperiDONE (RISPERDAL) 1 MG tablet, Take 1 tablet (1 mg total) by mouth daily., Disp: 30 tablet, Rfl: 0 .  risperiDONE (RISPERDAL) 4 MG tablet, Take 1 tablet (4 mg total) by mouth at bedtime., Disp: 30 tablet, Rfl: 0 .  topiramate (TOPAMAX) 50 MG tablet, Take 1 tablet (50 mg total) by mouth at bedtime., Disp: 30 tablet, Rfl: 0  No Known Allergies   ROS  Constitutional: Negative for fever, positive  weight change.  Respiratory: Negative for cough and shortness of breath.   Cardiovascular: Negative for  chest pain or palpitations.  Gastrointestinal: Negative abdominal pain, no bowel changes.  Musculoskeletal: Negative for gait problem or joint swelling.  Skin: Negative for rash.  Neurological: Negative for dizziness or headache.  No other specific complaints in a complete review of systems (except as listed in HPI above).  Objective  Vitals:   02/07/16 0953  BP: (!) 146/92  Pulse: (!) 106  Resp: 18  Temp: 98.9 F (37.2 C)  TempSrc: Oral  SpO2: 98%  Weight: 174 lb (78.9 kg)  Height: 5\' 5"  (1.651 m)    Body mass index is 28.96 kg/m.  Physical Exam  Constitutional: Patient appears well-developed and well-nourished. Overweight No distress.  HEENT: head atraumatic, normocephalic, pupils equal and reactive to light, , neck supple, throat within normal limits Cardiovascular: Normal rate, regular rhythm and normal heart sounds.  No murmur heard. No BLE edema. Pulmonary/Chest: Effort normal and breath sounds normal. No respiratory distress. Abdominal: Soft.  There is no tenderness. Psychiatric: Patient has a flat affect.   PHQ2/9: Depression screen [facility] 2/9 02/07/2016 09/17/2015 06/28/2015 03/03/2015 12/16/2014  Decreased Interest 0 0 0 0 0  Down, Depressed, Hopeless 0 0 0 0 0  PHQ - 2 Score 0 0 0 0 0     Fall Risk: Fall Risk  02/07/2016 09/17/2015 06/28/2015 03/03/2015 12/16/2014  Falls in the past year? No No No No No      Functional Status Survey: Is the patient deaf or have difficulty hearing?: No Does the patient have difficulty seeing, even when wearing glasses/contacts?: No Does the patient have difficulty concentrating, remembering, or making decisions?: Yes Does the patient have difficulty walking or climbing stairs?: No Does the patient have difficulty dressing or bathing?: No Does the patient have difficulty doing errands alone such as visiting a doctor's office or shopping?: Yes (Patient does not drive)    Assessment & Plan  1. Benign essential  hypertension  - COMPLETE METABOLIC PANEL WITH GFR - CBC with Differential/Platelet  2. Moderate mental retardation  Stable at group home  3. Active autistic disorder  Poor eye contact  4. Dysmetabolic syndrome  With symptoms of diabetes, but not sure if he is giving me the correct information   5. Dyslipidemia  - Lipid panel  6. Hyperglycemia  - Hemoglobin A1c  7. Vitamin D deficiency  - VITAMIN D 25 Hydroxy (Vit-D Deficiency, Fractures)  8. Tachycardia  - TSH  9. Weight loss  Likely from life style modification but we will check TSH  10. Needs flu shot  - Flu Vaccine QUAD 36+ mos IM

## 2016-02-08 LAB — VITAMIN D 25 HYDROXY (VIT D DEFICIENCY, FRACTURES): VIT D 25 HYDROXY: 30 ng/mL (ref 30–100)

## 2016-02-08 LAB — HEMOGLOBIN A1C
HEMOGLOBIN A1C: 5.5 % (ref <5.7)
MEAN PLASMA GLUCOSE: 111 mg/dL

## 2016-03-27 DIAGNOSIS — R410 Disorientation, unspecified: Secondary | ICD-10-CM | POA: Diagnosis not present

## 2016-03-27 DIAGNOSIS — R451 Restlessness and agitation: Secondary | ICD-10-CM | POA: Diagnosis not present

## 2016-04-10 DIAGNOSIS — F061 Catatonic disorder due to known physiological condition: Secondary | ICD-10-CM | POA: Insufficient documentation

## 2016-05-15 ENCOUNTER — Encounter: Payer: Self-pay | Admitting: Family Medicine

## 2016-05-15 ENCOUNTER — Ambulatory Visit (INDEPENDENT_AMBULATORY_CARE_PROVIDER_SITE_OTHER): Payer: Medicare Other | Admitting: Family Medicine

## 2016-05-15 VITALS — BP 122/68 | HR 116 | Temp 99.3°F | Resp 18 | Ht 65.0 in | Wt 163.6 lb

## 2016-05-15 DIAGNOSIS — E785 Hyperlipidemia, unspecified: Secondary | ICD-10-CM | POA: Diagnosis not present

## 2016-05-15 DIAGNOSIS — J3089 Other allergic rhinitis: Secondary | ICD-10-CM

## 2016-05-15 DIAGNOSIS — E8881 Metabolic syndrome: Secondary | ICD-10-CM

## 2016-05-15 DIAGNOSIS — F840 Autistic disorder: Secondary | ICD-10-CM

## 2016-05-15 DIAGNOSIS — I10 Essential (primary) hypertension: Secondary | ICD-10-CM | POA: Diagnosis not present

## 2016-05-15 DIAGNOSIS — J069 Acute upper respiratory infection, unspecified: Secondary | ICD-10-CM

## 2016-05-15 MED ORDER — LISINOPRIL 20 MG PO TABS: 20.0000 mg | ORAL_TABLET | Freq: Every day | ORAL | 1 refills | Status: DC

## 2016-05-15 MED ORDER — DM-GUAIFENESIN ER 30-600 MG PO TB12: 1.0000 | ORAL_TABLET | Freq: Two times a day (BID) | ORAL | 0 refills | Status: DC

## 2016-05-15 MED ORDER — METFORMIN HCL 1000 MG PO TABS: 1000.0000 mg | ORAL_TABLET | Freq: Two times a day (BID) | ORAL | 1 refills | Status: DC

## 2016-05-15 MED ORDER — FLUTICASONE PROPIONATE 50 MCG/ACT NA SUSP: 2.0000 | Freq: Every day | NASAL | 2 refills | Status: DC

## 2016-05-15 MED ORDER — LORAZEPAM 1 MG PO TABS: 3.0000 mg | ORAL_TABLET | Freq: Four times a day (QID) | ORAL | 0 refills | Status: DC | PRN

## 2016-05-15 MED ORDER — LORATADINE 10 MG PO TABS: 10.0000 mg | ORAL_TABLET | Freq: Every day | ORAL | 1 refills | Status: DC

## 2016-05-15 NOTE — Progress Notes (Signed)
Name: David Santos   MRN: [id number]    DOB: 1985/07/03   Date:05/15/2016       Progress Note  Subjective  Chief Complaint  Chief Complaint  Patient presents with  . Medication Refill    3 month F/U  . URI    Onset-3 days, productive cough, hacking cough, sneezing, nasal congestion. Patient unable to take anything unless prescribed by physician.  . Hypertension    Denies any symptoms, patient has lost 9 pounds since last visit due to dietary changes.  . Moderate MR    In group home seeing psychiatrist, doing well except help needing encouragement on hygiene     HPI  Hypertension Benign: doing well on Lisinopril , off HCTZ because it caused acute renal failure. He is compliant with medication, living in a group home now ( [facility] ). He is unable to self medicate. He denies chest pain or palpitation. BP is at goal today. BP is not being checked at the group home.   Moderate MR: he is now at a group home ( caregiver- Otilio Jefferson ), he was admitted for psychosis in June and has been in a group home since.  He sees a Therapist, sports. He has difficulty following directions.  Metabolic Syndrome:He is eating healthier  since he has been placed in a group home. He lost another 9 lbs since last visit. He skips meals, but caregiver states he does not seem depressed, he laughs and seems to be in a good mood. He does not engage with other residents because of autism, but seems fine with caregivers   Dyslipidemia: discussed importance of changing his diet. His last labs had improved significantly.  Tachycardia: and weight loss: last labs normal, no anemia or abnormal TSH, likely from life style modification and he gets nervous when at doctor's visits  AR: he has worsening of clear rhinorrhea and nasal congestion, he is not using nasal spray all the time, previous caregiver left, but he told CMA that he has a productive cough, he does not seem to be having SOB  Patient Active Problem List    Diagnosis Date Noted  . Catatonia 04/10/2016  . Schizophreniform disorder (HCC) 10/18/2015  . Autism spectrum disorder   . Dyslipidemia 06/28/2015  . Active autistic disorder 10/25/2014  . Benign essential hypertension 10/25/2014  . Chronic insomnia 10/25/2014  . History of acute renal failure 10/25/2014  . Dysmetabolic syndrome 10/25/2014  . Moderate mental retardation 10/25/2014  . Perennial allergic rhinitis 10/25/2014  . Obese 10/25/2014  . General psychoses 10/25/2014  . Seborrhea capitis 10/25/2014  . Vitamin D deficiency 01/15/2009  . Testicular hypofunction 07/17/2008    Past Surgical History:  Procedure Laterality Date  . TONSILLECTOMY AND ADENOIDECTOMY      Family History  Problem Relation Age of Onset  . Hypertension Mother   . Cancer Mother   . Graves' disease Mother   . Mental illness Father     Social History   Social History  . Marital status: Single    Spouse name: N/A  . Number of children: N/A  . Years of education: N/A   Occupational History  . Not on file.   Social History Main Topics  . Smoking status: Never Smoker  . Smokeless tobacco: Never Used  . Alcohol use No  . Drug use: No  . Sexual activity: No   Other Topics Concern  . Not on file   Social History Narrative  . No narrative on file  Current Outpatient Prescriptions:  .  clonazePAM (KLONOPIN) 0.5 MG tablet, Take 1 tablet (0.5 mg total) by mouth 2 (two) times daily., Disp: 60 tablet, Rfl: 0 .  diphenhydrAMINE (BENADRYL) 50 MG capsule, Take 50 mg by mouth as needed., Disp: , Rfl:  .  divalproex (DEPAKOTE ER) 500 MG 24 hr tablet, Take 1,000 mg by mouth., Disp: , Rfl:  .  fluticasone (FLONASE) 50 MCG/ACT nasal spray, Place 2 sprays into both nostrils daily., Disp: 16 g, Rfl: 0 .  lisinopril (PRINIVIL,ZESTRIL) 20 MG tablet, Take 1 tablet (20 mg total) by mouth daily., Disp: 90 tablet, Rfl: 0 .  metFORMIN (GLUCOPHAGE) 1000 MG tablet, Take 1,000 mg by mouth., Disp: , Rfl:  .   OLANZapine (ZYPREXA) 20 MG tablet, Take 20 mg by mouth., Disp: , Rfl:  .  risperiDONE (RISPERDAL) 1 MG tablet, Take 1 tablet (1 mg total) by mouth daily., Disp: 30 tablet, Rfl: 0 .  risperiDONE (RISPERDAL) 4 MG tablet, Take 1 tablet (4 mg total) by mouth at bedtime., Disp: 30 tablet, Rfl: 0 .  topiramate (TOPAMAX) 50 MG tablet, Take 1 tablet (50 mg total) by mouth at bedtime., Disp: 30 tablet, Rfl: 0  Allergies  Allergen Reactions  . Hydrochlorothiazide     Other reaction(s): Other (See Comments) Acute renal failure     ROS  Constitutional: Negative for fever , positive for weight change.  Respiratory: Negative for cough and shortness of breath.   Cardiovascular: Negative for chest pain or palpitations.  Gastrointestinal: Negative for abdominal pain, no bowel changes.  Musculoskeletal: Negative for gait problem or joint swelling.  Skin: Positive  for rash, seborrhea on scalp and face.  Neurological: Negative for dizziness or headache.  No other specific complaints in a complete review of systems (except as listed in HPI above).  Objective  Vitals:   05/15/16 1140  BP: 122/68  Pulse: (!) 116  Resp: 18  Temp: 99.3 F (37.4 C)  TempSrc: Oral  SpO2: 96%  Weight: 163 lb 9.6 oz (74.2 kg)  Height: 5\' 5"  (1.651 m)    Body mass index is 27.22 kg/m.  Physical Exam  Constitutional: Patient appears well-developed and well-nourished. Overweight No distress.  HEENT: head atraumatic, normocephalic, pupils equal and reactive to light, , neck supple, throat within normal limits Cardiovascular: Normal rate, regular rhythm and normal heart sounds.  No murmur heard. No BLE edema. Pulmonary/Chest: Effort normal and breath sounds normal. No respiratory distress. Abdominal: Soft.  There is no tenderness. Psychiatric: Patient has a flat affect.  PHQ2/9: Depression screen [facility] 2/9 05/15/2016 02/07/2016 09/17/2015 06/28/2015 03/03/2015  Decreased Interest 0 0 0 0 0  Down, Depressed, Hopeless 0  0 0 0 0  PHQ - 2 Score 0 0 0 0 0     Fall Risk: Fall Risk  05/15/2016 02/07/2016 09/17/2015 06/28/2015 03/03/2015  Falls in the past year? No No No No No     Functional Status Survey: Is the patient deaf or have difficulty hearing?: No Does the patient have difficulty seeing, even when wearing glasses/contacts?: No Does the patient have difficulty concentrating, remembering, or making decisions?: Yes Does the patient have difficulty walking or climbing stairs?: No Does the patient have difficulty dressing or bathing?: No Does the patient have difficulty doing errands alone such as visiting a doctor's office or shopping?: Yes    Assessment & Plan  1. Benign essential hypertension  - lisinopril (PRINIVIL,ZESTRIL) 20 MG tablet; Take 1 tablet (20 mg total) by mouth daily.  Dispense:  90 tablet; Refill: 1  2. Active autistic disorder  Continue follow up with psychiatrist - Dr. Blake DivineSamuel Linder  3. Dysmetabolic syndrome  - metFORMIN (GLUCOPHAGE) 1000 MG tablet; Take 1 tablet (1,000 mg total) by mouth 2 (two) times daily with a meal.  Dispense: 180 tablet; Refill: 1  4. Dyslipidemia  Continue life style modification   5. Perennial allergic rhinitis  - fluticasone (FLONASE) 50 MCG/ACT nasal spray; Place 2 sprays into both nostrils daily.  Dispense: 16 g; Refill: 2 - loratadine (CLARITIN) 10 MG tablet; Take 1 tablet (10 mg total) by mouth daily.  Dispense: 90 tablet; Refill: 1 Advised to add Mucinex DM prn    6. Upper respiratory tract infection, unspecified type  - dextromethorphan-guaiFENesin (MUCINEX DM) 30-600 MG 12hr tablet; Take 1 tablet by mouth 2 (two) times daily.  Dispense: 28 tablet; Refill: 0

## 2016-07-04 ENCOUNTER — Other Ambulatory Visit: Payer: Self-pay | Admitting: Family Medicine

## 2016-07-04 ENCOUNTER — Other Ambulatory Visit: Payer: Self-pay | Admitting: Psychiatry

## 2016-07-04 DIAGNOSIS — J3089 Other allergic rhinitis: Secondary | ICD-10-CM

## 2016-07-04 NOTE — Telephone Encounter (Signed)
Patient requesting refill of Claritin to [facility].

## 2016-07-25 ENCOUNTER — Encounter: Payer: Self-pay | Admitting: Family Medicine

## 2016-07-25 ENCOUNTER — Ambulatory Visit (INDEPENDENT_AMBULATORY_CARE_PROVIDER_SITE_OTHER): Payer: Medicare Other | Admitting: Family Medicine

## 2016-07-25 VITALS — BP 112/64 | HR 100 | Temp 97.8°F | Resp 16 | Ht 65.0 in | Wt 145.4 lb

## 2016-07-25 DIAGNOSIS — R634 Abnormal weight loss: Secondary | ICD-10-CM | POA: Diagnosis not present

## 2016-07-25 DIAGNOSIS — E8881 Metabolic syndrome: Secondary | ICD-10-CM | POA: Diagnosis not present

## 2016-07-25 LAB — COMPLETE METABOLIC PANEL WITH GFR
ALBUMIN: 4.4 g/dL (ref 3.6–5.1)
ALK PHOS: 47 U/L (ref 40–115)
ALT: 9 U/L (ref 9–46)
AST: 13 U/L (ref 10–40)
BUN: 15 mg/dL (ref 7–25)
CALCIUM: 9.8 mg/dL (ref 8.6–10.3)
CHLORIDE: 104 mmol/L (ref 98–110)
CO2: 25 mmol/L (ref 20–31)
Creat: 0.84 mg/dL (ref 0.60–1.35)
GFR, Est Non African American: >89 mL/min (ref >=60)
Glucose, Bld: 104 mg/dL — ABNORMAL HIGH (ref 65–99)
Potassium: 3.8 mmol/L (ref 3.5–5.3)
Sodium: 139 mmol/L (ref 135–146)
Total Bilirubin: 0.4 mg/dL (ref 0.2–1.2)
Total Protein: 7.2 g/dL (ref 6.1–8.1)

## 2016-07-25 LAB — CBC WITH DIFFERENTIAL/PLATELET
BASOS PCT: 0 %
Basophils Absolute: 0 cells/uL (ref 0–200)
EOS ABS: 165 {cells}/uL (ref 15–500)
Eosinophils Relative: 3 %
HEMATOCRIT: 43.2 % (ref 38.5–50.0)
HEMOGLOBIN: 14.3 g/dL (ref 13.2–17.1)
LYMPHS PCT: 36 %
Lymphs Abs: 1980 cells/uL (ref 850–3900)
MCH: 29 pg (ref 27.0–33.0)
MCHC: 33.1 g/dL (ref 32.0–36.0)
MCV: 87.6 fL (ref 80.0–100.0)
MONO ABS: 660 {cells}/uL (ref 200–950)
MPV: 9 fL (ref 7.5–12.5)
Monocytes Relative: 12 %
NEUTROS PCT: 49 %
Neutro Abs: 2695 cells/uL (ref 1500–7800)
Platelets: 298 10*3/uL (ref 140–400)
RBC: 4.93 MIL/uL (ref 4.20–5.80)
RDW: 14.5 % (ref 11.0–15.0)
WBC: 5.5 10*3/uL (ref 3.8–10.8)

## 2016-07-25 LAB — SEDIMENTATION RATE: SED RATE: 1 mm/h (ref 0–15)

## 2016-07-25 LAB — THYROID PANEL WITH TSH
Free Thyroxine Index: 2.6 (ref 1.4–3.8)
T3 UPTAKE: 34 % (ref 22–35)
T4 TOTAL: 7.7 ug/dL (ref 4.5–12.0)
TSH: 0.95 mIU/L (ref 0.40–4.50)

## 2016-07-25 MED ORDER — METFORMIN HCL 1000 MG PO TABS: 1000.0000 mg | ORAL_TABLET | Freq: Every day | ORAL | 1 refills | Status: DC

## 2016-07-25 NOTE — Addendum Note (Signed)
Addended by: Cynda FamiliaJOHNSON, Jerrel Tiberio L on: 07/25/2016 10:56 AM   Modules accepted: Orders

## 2016-07-25 NOTE — Progress Notes (Signed)
Name: David Santos   MRN: [id number]    DOB: [birth year]   Date:07/25/2016       Progress Note  Subjective  Chief Complaint  Chief Complaint  Patient presents with  . Weight Loss    Patient has losted 19 pounds since last visit, caregiver states he eats 3 meals a day and snacks. Feels like it might be the increase Metformin dosage causing him the weight loss.    HPI  Weight loss: he was advised to lose weight last October, and has started walking for 2 hours a day ( with two other people ) on a track. He has lost a total of 39 lbs 5 months, but 19 lbs since 05/15/2016. He has a good appetite, eating multiple times a day, he was also started on Metformin 1000 mg twice daily January 2018. His mood seems stable, no crying and does not seem to be sad. No change in bowel movements, rashes, nausea or vomiting.    Patient Active Problem List   Diagnosis Date Noted  . Catatonia 04/10/2016  . Schizophreniform disorder (HCC) 10/18/2015  . Autism spectrum disorder   . Dyslipidemia 06/28/2015  . Active autistic disorder 10/25/2014  . Benign essential hypertension 10/25/2014  . Chronic insomnia 10/25/2014  . History of acute renal failure 10/25/2014  . Dysmetabolic syndrome 10/25/2014  . Moderate mental retardation 10/25/2014  . Perennial allergic rhinitis 10/25/2014  . Obese 10/25/2014  . General psychoses 10/25/2014  . Seborrhea capitis 10/25/2014  . Personal history of urethral stricture 10/25/2014  . Vitamin D deficiency 01/15/2009  . Testicular hypofunction 07/17/2008    Past Surgical History:  Procedure Laterality Date  . TONSILLECTOMY AND ADENOIDECTOMY      Family History  Problem Relation Age of Onset  . Hypertension Mother   . Cancer Mother   . Graves' disease Mother   . Mental illness Father     Social History   Social History  . Marital status: Single    Spouse name: N/A  . Number of children: N/A  . Years of education: N/A   Occupational History  . Not on  file.   Social History Main Topics  . Smoking status: Never Smoker  . Smokeless tobacco: Never Used  . Alcohol use No  . Drug use: No  . Sexual activity: No   Other Topics Concern  . Not on file   Social History Narrative  . No narrative on file     Current Outpatient Prescriptions:  .  clonazePAM (KLONOPIN) 0.5 MG tablet, Take 1 tablet (0.5 mg total) by mouth 2 (two) times daily., Disp: 60 tablet, Rfl: 0 .  dextromethorphan-guaiFENesin (MUCINEX DM) 30-600 MG 12hr tablet, Take 1 tablet by mouth 2 (two) times daily., Disp: 28 tablet, Rfl: 0 .  diphenhydrAMINE (BENADRYL) 50 MG capsule, Take 50 mg by mouth as needed., Disp: , Rfl:  .  divalproex (DEPAKOTE ER) 500 MG 24 hr tablet, Take 1,000 mg by mouth., Disp: , Rfl:  .  fluticasone (FLONASE) 50 MCG/ACT nasal spray, Place 2 sprays into both nostrils daily., Disp: 16 g, Rfl: 2 .  lisinopril (PRINIVIL,ZESTRIL) 20 MG tablet, Take 1 tablet (20 mg total) by mouth daily., Disp: 90 tablet, Rfl: 1 .  loratadine (CLARITIN) 10 MG tablet, TAKE ONE TABLET BY MOUTH EVERY DAY, Disp: 30 tablet, Rfl: 4 .  LORazepam (ATIVAN) 1 MG tablet, Take 3 tablets (3 mg total) by mouth every 6 (six) hours as needed for anxiety., Disp: 360 tablet, Rfl: 0 .  metFORMIN (GLUCOPHAGE) 1000 MG tablet, Take 1 tablet (1,000 mg total) by mouth 2 (two) times daily with a meal., Disp: 180 tablet, Rfl: 1 .  OLANZapine (ZYPREXA) 20 MG tablet, Take 20 mg by mouth., Disp: , Rfl:  .  risperiDONE (RISPERDAL) 4 MG tablet, Take 1 tablet (4 mg total) by mouth at bedtime., Disp: 30 tablet, Rfl: 0 .  topiramate (TOPAMAX) 50 MG tablet, Take 1 tablet (50 mg total) by mouth at bedtime., Disp: 30 tablet, Rfl: 0  Allergies  Allergen Reactions  . Hydrochlorothiazide     Other reaction(s): Other (See Comments) Acute renal failure     ROS  Ten systems reviewed and is negative except as mentioned in HPI   Objective  Vitals:   07/25/16 1020  BP: 112/64  Pulse: (!) 109  Resp: 16   Temp: 97.8 F (36.6 C)  TempSrc: Oral  SpO2: 98%  Weight: 145 lb 6.4 oz (66 kg)  Height: 5\' 5"  (1.651 m)    Body mass index is 24.2 kg/m.  Physical Exam  Constitutional: Patient appears well-developed and well-nourished.  No distress.  HEENT: head atraumatic, normocephalic, pupils equal and reactive to light, neck supple, throat within normal limits Cardiovascular: Normal rate, regular rhythm and normal heart sounds.  No murmur heard. No BLE edema. Pulmonary/Chest: Effort normal and breath sounds normal. No respiratory distress. Abdominal: Soft.  There is no tenderness. Psychiatric: Cooperative, good eye contact  PHQ2/9: Depression screen [facility] 2/9 07/25/2016 05/15/2016 02/07/2016 09/17/2015 06/28/2015  Decreased Interest 0 0 0 0 0  Down, Depressed, Hopeless 0 0 0 0 0  PHQ - 2 Score 0 0 0 0 0    Fall Risk: Fall Risk  07/25/2016 05/15/2016 02/07/2016 09/17/2015 06/28/2015  Falls in the past year? No No No No No    Functional Status Survey: Is the patient deaf or have difficulty hearing?: No Does the patient have difficulty seeing, even when wearing glasses/contacts?: No Does the patient have difficulty concentrating, remembering, or making decisions?: Yes Does the patient have difficulty walking or climbing stairs?: No Does the patient have difficulty dressing or bathing?: Yes Does the patient have difficulty doing errands alone such as visiting a doctor's office or shopping?: Yes    Assessment & Plan  1. Abnormal weight loss  It could be secondary to increase in exercise and also on metformin  - CBC with Differential/Platelet - COMPLETE METABOLIC PANEL WITH GFR - Thyroid Panel With TSH - Sedimentation rate - HIV antibody  We will decrease Metformin to once a day

## 2016-07-26 LAB — HIV ANTIBODY (ROUTINE TESTING W REFLEX): HIV 1&2 Ab, 4th Generation: NONREACTIVE

## 2016-08-16 ENCOUNTER — Other Ambulatory Visit: Payer: Self-pay | Admitting: Family Medicine

## 2016-08-16 ENCOUNTER — Other Ambulatory Visit: Payer: Self-pay

## 2016-08-16 DIAGNOSIS — I10 Essential (primary) hypertension: Secondary | ICD-10-CM

## 2016-08-16 NOTE — Telephone Encounter (Signed)
Patient requesting refill of Lisinopril to [facility].  

## 2016-08-16 NOTE — Patient Outreach (Signed)
Call Mr. David Santos   And spoke to his mom ,she said that David Santos has Outism and he is at a group home,call the pharmacy they said Mr, Encarnacion has not pick up his medication but, patient mom call the group home and told her David Santos has his medication. David Santos (mother) wants the pharmacist to help her with David Santos medication.  Lillia Abed CPhT Pharmacy Technician [facility] Management Direct Dial [phone]  Fax [phone] [email or web address]

## 2016-08-17 ENCOUNTER — Telehealth: Payer: Self-pay | Admitting: Pharmacist

## 2016-08-17 NOTE — Patient Outreach (Signed)
[facility]) Care Management  08/17/2016  CLABORN JANUSZ Nov 12, 1985 [id number]   Patient's mother [name]) was called regarding medication assistance and management per referral from [facility] Pharmacy Technician, Lillia Abed.  No answer. HIPAA compliant message left on Ms. Breeze's voicemail.  Plan:  I will attempt to reach patient's mother tomorrow.   Beecher Mcardle, PharmD, BCACP [facility] Clinical Pharmacist [phone]

## 2016-08-18 ENCOUNTER — Other Ambulatory Visit: Payer: Self-pay | Admitting: Pharmacist

## 2016-08-18 ENCOUNTER — Ambulatory Visit: Payer: Medicare Other | Admitting: Pharmacist

## 2016-08-18 NOTE — Patient Outreach (Addendum)
[facility]) Care Management  08/18/2016  David Santos 05/06/1986 [id number]  Patient's mother Aviva Kluver) and the group home coordinator, Clinton Sawyer regarding medication adherence to lisinopril.  HIPAA identifiers were obtained.  Patient's  Mother was called by [facility] Pharmacy technician, Lillia Abed as part of our medication adherence initiative.  According to the group home administrator, patient was hospitalized (11/17-12/17) and was given medications after the hospitalization to cover him until his next appointment. The administrator, confirmed the patient has all medications and is being given them as prescribed.  [facility] pharmacy was called. I spoke with a representative named, Seama.  Sandy verified Thorin had Lisinopril filled: 08/17/16, 07/04/16 and 04/14/16   Plan:  Close pharmacy case.  Beecher Mcardle, PharmD, BCACP [facility] Clinical Pharmacist [phone]

## 2016-08-31 DIAGNOSIS — I10 Essential (primary) hypertension: Secondary | ICD-10-CM | POA: Diagnosis not present

## 2016-08-31 DIAGNOSIS — Z79899 Other long term (current) drug therapy: Secondary | ICD-10-CM | POA: Diagnosis not present

## 2016-09-02 ENCOUNTER — Encounter: Payer: Self-pay | Admitting: Pharmacist

## 2016-10-04 DIAGNOSIS — Z79899 Other long term (current) drug therapy: Secondary | ICD-10-CM | POA: Diagnosis not present

## 2016-11-13 ENCOUNTER — Ambulatory Visit (INDEPENDENT_AMBULATORY_CARE_PROVIDER_SITE_OTHER): Payer: Medicare Other | Admitting: Family Medicine

## 2016-11-13 ENCOUNTER — Encounter: Payer: Self-pay | Admitting: Family Medicine

## 2016-11-13 DIAGNOSIS — E559 Vitamin D deficiency, unspecified: Secondary | ICD-10-CM

## 2016-11-13 DIAGNOSIS — F71 Moderate intellectual disabilities: Secondary | ICD-10-CM

## 2016-11-13 DIAGNOSIS — F2081 Schizophreniform disorder: Secondary | ICD-10-CM | POA: Diagnosis not present

## 2016-11-13 DIAGNOSIS — R739 Hyperglycemia, unspecified: Secondary | ICD-10-CM

## 2016-11-13 DIAGNOSIS — I10 Essential (primary) hypertension: Secondary | ICD-10-CM | POA: Diagnosis not present

## 2016-11-13 DIAGNOSIS — E785 Hyperlipidemia, unspecified: Secondary | ICD-10-CM | POA: Diagnosis not present

## 2016-11-13 DIAGNOSIS — E8881 Metabolic syndrome: Secondary | ICD-10-CM

## 2016-11-13 DIAGNOSIS — F840 Autistic disorder: Secondary | ICD-10-CM | POA: Diagnosis not present

## 2016-11-13 DIAGNOSIS — R634 Abnormal weight loss: Secondary | ICD-10-CM

## 2016-11-13 DIAGNOSIS — R000 Tachycardia, unspecified: Secondary | ICD-10-CM | POA: Diagnosis not present

## 2016-11-13 NOTE — Patient Instructions (Signed)
Please return fasting in 3 months for labs

## 2016-11-13 NOTE — Progress Notes (Signed)
Name: David SimmeringDashawn K Santos   MRN: [id number]    DOB: 03-15-1986   Date:11/13/2016       Progress Note  Subjective  Chief Complaint  Chief Complaint  Patient presents with  . Medication Refill    6 month F/U  . abnormal weight loss    Has losted 12 pounds since last visit, Psychiatrist thinks it could be due to Metformin  . Hypertension    Denies any symptoms  . Allergic Rhinitis     Sneezing and coughing  . Moderate MR    Stable    HPI  Hypertension Benign: doing well on Lisinopril , off HCTZ because it caused acute renal failure. He is compliant with medication, living in a group home now ( [facility] ). He is unable to self medicate. He denies chest pain or palpitation. BP is at goal today. Mother brought him in today.   Moderate MR: he is now at a group home , he was admitted for psychosis in June 2017  and has been in a group home since. He sees a Therapist, sportspsychiatrist. He has difficulty following directions.  Metabolic Syndrome:He is eating healthier  since he has been placed in a group home. He lost another 12 lbs since last visit  6 months ago. He skips meals, we will stop metformin since he continues to lose weight , recheck labs  Schizophrenia, autism: mother states he does not seem depressed, he laughs and seems to be in a good mood. He does not engage with other residents because of autism, but seems fine with caregivers. He had to be hospitalized because of aggression. He goes to day programs and enjoys doing that.    Dyslipidemia: discussed importance of changing his diet. His last labs had improved significantly, but HDL was still low, not fasting today, but will returning for his next appointment so we can draw his labs.  Tachycardia: and weight loss: last labs normal, no anemia or abnormal TSH, likely from life style modification and he gets nervous when at doctor's visits.    Patient Active Problem List   Diagnosis Date Noted  . Catatonia 04/10/2016  . Schizophreniform  disorder (HCC) 10/18/2015  . Autism spectrum disorder   . Dyslipidemia 06/28/2015  . Active autistic disorder 10/25/2014  . Benign essential hypertension 10/25/2014  . Chronic insomnia 10/25/2014  . History of acute renal failure 10/25/2014  . Dysmetabolic syndrome 10/25/2014  . Moderate mental retardation 10/25/2014  . Perennial allergic rhinitis 10/25/2014  . Obese 10/25/2014  . General psychoses 10/25/2014  . Seborrhea capitis 10/25/2014  . Personal history of urethral stricture 10/25/2014  . Vitamin D deficiency 01/15/2009  . Testicular hypofunction 07/17/2008    Past Surgical History:  Procedure Laterality Date  . TONSILLECTOMY AND ADENOIDECTOMY      Family History  Problem Relation Age of Onset  . Hypertension Mother   . Cancer Mother   . Graves' disease Mother   . Mental illness Father     Social History   Social History  . Marital status: Single    Spouse name: N/A  . Number of children: N/A  . Years of education: N/A   Occupational History  . Not on file.   Social History Main Topics  . Smoking status: Never Smoker  . Smokeless tobacco: Never Used  . Alcohol use No  . Drug use: No  . Sexual activity: No   Other Topics Concern  . Not on file   Social History Narrative  .  No narrative on file     Current Outpatient Prescriptions:  .  clonazePAM (KLONOPIN) 0.5 MG tablet, Take 1 tablet (0.5 mg total) by mouth 2 (two) times daily., Disp: 60 tablet, Rfl: 0 .  diphenhydrAMINE (BENADRYL) 50 MG capsule, Take 50 mg by mouth as needed., Disp: , Rfl:  .  divalproex (DEPAKOTE ER) 500 MG 24 hr tablet, Take 1,000 mg by mouth., Disp: , Rfl:  .  fluticasone (FLONASE) 50 MCG/ACT nasal spray, Place 2 sprays into both nostrils daily., Disp: 16 g, Rfl: 2 .  lisinopril (PRINIVIL,ZESTRIL) 20 MG tablet, TAKE ONE TABLET BY MOUTH EVERY DAY, Disp: 30 tablet, Rfl: 10 .  loratadine (CLARITIN) 10 MG tablet, TAKE ONE TABLET BY MOUTH EVERY DAY, Disp: 30 tablet, Rfl: 4 .   LORazepam (ATIVAN) 1 MG tablet, Take 3 tablets (3 mg total) by mouth every 6 (six) hours as needed for anxiety., Disp: 360 tablet, Rfl: 0 .  OLANZapine (ZYPREXA) 20 MG tablet, Take 20 mg by mouth., Disp: , Rfl:  .  risperiDONE (RISPERDAL) 4 MG tablet, Take 1 tablet (4 mg total) by mouth at bedtime., Disp: 30 tablet, Rfl: 0 .  topiramate (TOPAMAX) 50 MG tablet, Take 1 tablet (50 mg total) by mouth at bedtime., Disp: 30 tablet, Rfl: 0  Allergies  Allergen Reactions  . Hydrochlorothiazide     Other reaction(s): Other (See Comments) Acute renal failure     ROS  Constitutional: Negative for fever, positive for weight change.  Respiratory: Negative for cough and shortness of breath.   Cardiovascular: Negative for chest pain or palpitations.  Gastrointestinal: Negative for abdominal pain, no bowel changes.  Musculoskeletal: Negative for gait problem or joint swelling.  Skin: Negative for rash.  Neurological: Negative for dizziness or headache.  No other specific complaints in a complete review of systems (except as listed in HPI above).  Objective  Vitals:   11/13/16 1048  BP: 132/86  Pulse: (!) 102  Resp: 16  Temp: 97.8 F (36.6 C)  TempSrc: Oral  SpO2: 98%  Weight: 133 lb 6.4 oz (60.5 kg)  Height: 5\' 5"  (1.651 m)    Body mass index is 22.2 kg/m.  Physical Exam  Constitutional: Patient appears well-developed and well-nourished.  No distress.  HEENT: head atraumatic, normocephalic, pupils equal and reactive to light, neck supple, throat within normal limits Cardiovascular: Normal rate, regular rhythm and normal heart sounds.  No murmur heard. No BLE edema. Pulmonary/Chest: Effort normal and breath sounds normal. No respiratory distress. Abdominal: Soft.  There is no tenderness. Psychiatric: Patient has a flat  affect. , cooperative  Judgment and thought content normal.  PHQ2/9: Depression screen [facility] 2/9 11/13/2016 07/25/2016 05/15/2016 02/07/2016 09/17/2015  Decreased Interest  0 0 0 0 0  Down, Depressed, Hopeless 0 0 0 0 0  PHQ - 2 Score 0 0 0 0 0    Fall Risk: Fall Risk  11/13/2016 07/25/2016 05/15/2016 02/07/2016 09/17/2015  Falls in the past year? No No No No No    Functional Status Survey: Is the patient deaf or have difficulty hearing?: No Does the patient have difficulty seeing, even when wearing glasses/contacts?: No Does the patient have difficulty concentrating, remembering, or making decisions?: Yes Does the patient have difficulty walking or climbing stairs?: No Does the patient have difficulty dressing or bathing?: No Does the patient have difficulty doing errands alone such as visiting a doctor's office or shopping?: Yes    Assessment & Plan  1. Dysmetabolic syndrome  We will stop  Metformin, he is losing a lot of weight - Hemoglobin A1c  2. Schizophreniform disorder (HCC)  Called group home staff and they will drop off a list of his current medication  3. Benign essential hypertension  At goal   4. Active autistic disorder  stable  5. Dyslipidemia  On life style modification only, last lipid showed low HDL and we will recheck it when fasting   6. Moderate mental retardation  stable  7. Hyperglycemia  - Hemoglobin A1c  8. Vitamin D deficiency  Taking supplements for vitamin D  9. Tachycardia  Stable , slightly above 100, stable and normal labs  10. Weight loss  Reviewed labs done in Feb and normal, including thyroid panel and HIV, we will stop metformin and monitor weight

## 2016-11-14 LAB — HEMOGLOBIN A1C
Hgb A1c MFr Bld: 5.1 % (ref <5.7)
MEAN PLASMA GLUCOSE: 100 mg/dL

## 2016-12-12 ENCOUNTER — Other Ambulatory Visit: Payer: Self-pay | Admitting: Family Medicine

## 2016-12-12 DIAGNOSIS — J3089 Other allergic rhinitis: Secondary | ICD-10-CM

## 2017-01-05 ENCOUNTER — Other Ambulatory Visit: Payer: Self-pay | Admitting: Family Medicine

## 2017-01-05 DIAGNOSIS — J3089 Other allergic rhinitis: Secondary | ICD-10-CM

## 2017-01-05 NOTE — Telephone Encounter (Signed)
Patient requesting refill of Loratadine to [facility].

## 2017-02-13 ENCOUNTER — Ambulatory Visit (INDEPENDENT_AMBULATORY_CARE_PROVIDER_SITE_OTHER): Payer: Medicare Other | Admitting: Family Medicine

## 2017-02-13 ENCOUNTER — Encounter: Payer: Self-pay | Admitting: Family Medicine

## 2017-02-13 VITALS — BP 90/60 | HR 105 | Resp 14 | Ht 65.0 in | Wt 129.2 lb

## 2017-02-13 DIAGNOSIS — E785 Hyperlipidemia, unspecified: Secondary | ICD-10-CM

## 2017-02-13 DIAGNOSIS — E559 Vitamin D deficiency, unspecified: Secondary | ICD-10-CM | POA: Diagnosis not present

## 2017-02-13 DIAGNOSIS — R739 Hyperglycemia, unspecified: Secondary | ICD-10-CM

## 2017-02-13 DIAGNOSIS — E8881 Metabolic syndrome: Secondary | ICD-10-CM | POA: Diagnosis not present

## 2017-02-13 DIAGNOSIS — F840 Autistic disorder: Secondary | ICD-10-CM | POA: Diagnosis not present

## 2017-02-13 DIAGNOSIS — I10 Essential (primary) hypertension: Secondary | ICD-10-CM

## 2017-02-13 DIAGNOSIS — R634 Abnormal weight loss: Secondary | ICD-10-CM

## 2017-02-13 DIAGNOSIS — F71 Moderate intellectual disabilities: Secondary | ICD-10-CM | POA: Diagnosis not present

## 2017-02-13 DIAGNOSIS — Z23 Encounter for immunization: Secondary | ICD-10-CM

## 2017-02-13 DIAGNOSIS — F2081 Schizophreniform disorder: Secondary | ICD-10-CM | POA: Diagnosis not present

## 2017-02-13 LAB — LIPID PANEL
CHOLESTEROL: 160 mg/dL (ref <200)
HDL: 48 mg/dL (ref >40)
LDL CHOLESTEROL (CALC): 86 mg/dL
Non-HDL Cholesterol (Calc): 112 mg/dL (calc) (ref <130)
TRIGLYCERIDES: 158 mg/dL — AB (ref <150)
Total CHOL/HDL Ratio: 3.3 (calc) (ref <5.0)

## 2017-02-13 LAB — POCT GLYCOSYLATED HEMOGLOBIN (HGB A1C): HEMOGLOBIN A1C: 5

## 2017-02-13 NOTE — Progress Notes (Signed)
Name: David Santos   MRN: [id number]    DOB: Jul 22, 1985   Date:02/13/2017       Progress Note  Subjective  Chief Complaint  Chief Complaint  Patient presents with  . Diabetes    prediabetes  . Weight Loss    HPI  Hypertension Benign: doing well on Lisinopril , off HCTZ because it caused acute renal failure. He is compliant with medication, living in a group home now ( [facility] ). He is unable to self medicate. He denies chest pain or palpitation. BP is towards low end of normal today. Caregiver - Ortencia Kick is here with him  Moderate MR: he is now at a group home , he was admitted for psychosis in June 2017  and has been in a group home since. He sees a psychiatrist, Dr. Janeece Riggers. Reviewed medications from group home, he is off Zyprexa, higher dose BZD and topamax, seems to be doing well on Risperdal Benadryl , depakote. Takes clonazepam qhs   Metabolic Syndrome:He is eating healthier since he has been placed in group home 2017. Lost a total of 45 lbs in the past year. He did not know that he was going from hospital to group home upon discharge, he was very depressed and did not eat - wanted to go home. However around Spring of 2018 he has been very active, likes to walk, caregiver states he has a great appetite, and wants to eat other residents food also. He was given a letter by psychiatrist to allow him to eat more.   Schizophrenia, autism: caregiver  states he does not seem depressed, he laughs and seems to be in a good mood. He does not engage with other residents because of autism, likes to walk/and also some pacing, but seems fine with caregivers. He had to be hospitalized because of aggression. He goes to day programs and enjoys doing that. Recently having to go home to take showers - father picks him up. His behavior has been stable.    Dyslipidemia: we will recheck fasting labs.   Tachycardia: and weight loss: last labs normal, HIV negative, sed rate normal, no anemia or normal  TSH, likely from life style modification and he gets nervous when at doctor's visits.    Patient Active Problem List   Diagnosis Date Noted  . Catatonia 04/10/2016  . Schizophreniform disorder (HCC) 10/18/2015  . Autism spectrum disorder   . Dyslipidemia 06/28/2015  . Active autistic disorder 10/25/2014  . Benign essential hypertension 10/25/2014  . Chronic insomnia 10/25/2014  . History of acute renal failure 10/25/2014  . Dysmetabolic syndrome 10/25/2014  . Moderate mental retardation 10/25/2014  . Perennial allergic rhinitis 10/25/2014  . Obese 10/25/2014  . General psychoses (HCC) 10/25/2014  . Seborrhea capitis 10/25/2014  . Personal history of urethral stricture 10/25/2014  . Vitamin D deficiency 01/15/2009  . Testicular hypofunction 07/17/2008    Past Surgical History:  Procedure Laterality Date  . TONSILLECTOMY AND ADENOIDECTOMY      Family History  Problem Relation Age of Onset  . Hypertension Mother   . Cancer Mother   . Graves' disease Mother   . Mental illness Father     Social History   Social History  . Marital status: Single    Spouse name: N/A  . Number of children: N/A  . Years of education: N/A   Occupational History  . Not on file.   Social History Main Topics  . Smoking status: Never Smoker  . Smokeless tobacco:  Never Used  . Alcohol use No  . Drug use: No  . Sexual activity: No   Other Topics Concern  . Not on file   Social History Narrative  . No narrative on file     Current Outpatient Prescriptions:  .  clonazePAM (KLONOPIN) 0.5 MG tablet, Take 1 tablet (0.5 mg total) by mouth 2 (two) times daily., Disp: 60 tablet, Rfl: 0 .  diphenhydrAMINE (BENADRYL) 50 MG capsule, Take 50 mg by mouth as needed., Disp: , Rfl:  .  divalproex (DEPAKOTE ER) 500 MG 24 hr tablet, Take 1,000 mg by mouth., Disp: , Rfl:  .  fluticasone (FLONASE) 50 MCG/ACT nasal spray, SPRAY 2 SPRAYS INTO BOTH NOSTRILS EVERY DAY, Disp: 16 g, Rfl: 11 .  lisinopril  (PRINIVIL,ZESTRIL) 20 MG tablet, TAKE ONE TABLET BY MOUTH EVERY DAY, Disp: 30 tablet, Rfl: 10 .  loratadine (CLARITIN) 10 MG tablet, TAKE ONE TABLET BY MOUTH EVERY DAY, Disp: 30 tablet, Rfl: 11 .  risperiDONE (RISPERDAL) 4 MG tablet, Take 1 tablet (4 mg total) by mouth at bedtime., Disp: 30 tablet, Rfl: 0  Allergies  Allergen Reactions  . Hydrochlorothiazide     Other reaction(s): Other (See Comments) Acute renal failure     ROS  Ten systems reviewed and is negative except as mentioned in HPI   Objective  Vitals:   02/13/17 1043  BP: 90/60  Pulse: (!) 105  Resp: 14  SpO2: 98%  Weight: 129 lb 3.2 oz (58.6 kg)  Height:  (1.651 m)    Body mass index is 21.5 kg/m.  Physical Exam  Constitutional: Patient appears well-developed and well-nourished. No distress.  HEENT: head atraumatic, normocephalic, pupils equal and reactive to light,  neck supple, throat within normal limits Cardiovascular: Normal rate, regular rhythm and normal heart sounds.  No murmur heard. No BLE edema. Pulmonary/Chest: Effort normal and breath sounds normal. No respiratory distress. Abdominal: Soft.  There is no tenderness. Psychiatric: Quiet and cooperative, some eye contact, fidgety - moving his leg while sitting down   PHQ2/9: Depression screen [facility] 2/9 11/13/2016 07/25/2016 05/15/2016 02/07/2016 09/17/2015  Decreased Interest 0 0 0 0 0  Down, Depressed, Hopeless 0 0 0 0 0  PHQ - 2 Score 0 0 0 0 0     Fall Risk: Fall Risk  02/13/2017 11/13/2016 07/25/2016 05/15/2016 02/07/2016  Falls in the past year? No No No No No      Assessment & Plan  1. Hyperglycemia  - POCT HgB A1C  2. Needs flu shot  - Flu Vaccine QUAD 6+ mos PF IM (Fluarix Quad PF)  3. Dysmetabolic syndrome  Normal hgbA1C  4. Schizophreniform disorder (HCC)  Seeing Dr. Janeece Riggers  5. Benign essential hypertension  bp towards low end of normal, no symptoms we advised to monitor at group home  6. Active autistic disorder   7.  Dyslipidemia  - Lipid panel  8. Vitamin D deficiency   9. Moderate mental retardation  Stable, lives in a group home  10. Weight loss  His weight is more stable now, caregiver states he loves to walk , at the day center and also at home, does not sit still often, eats well, no complaints, labs reviewed. He is off Zyprexa and may be the reason he has lost some weight also. We will monitor. Currently not underweight.

## 2017-02-13 NOTE — Patient Instructions (Signed)
Monitor bp at the home ( pharmacy) if remains below 100/70 we will adjust dose of Lisinopril Return fasting for labs

## 2017-04-23 ENCOUNTER — Ambulatory Visit (INDEPENDENT_AMBULATORY_CARE_PROVIDER_SITE_OTHER): Payer: Medicare Other | Admitting: Family Medicine

## 2017-04-23 ENCOUNTER — Encounter: Payer: Self-pay | Admitting: Family Medicine

## 2017-04-23 VITALS — BP 110/68 | HR 97 | Temp 98.0°F | Resp 14 | Ht 65.0 in | Wt 131.5 lb

## 2017-04-23 DIAGNOSIS — R6889 Other general symptoms and signs: Secondary | ICD-10-CM

## 2017-04-23 DIAGNOSIS — G4700 Insomnia, unspecified: Secondary | ICD-10-CM

## 2017-04-23 DIAGNOSIS — H5789 Other specified disorders of eye and adnexa: Secondary | ICD-10-CM | POA: Diagnosis not present

## 2017-04-23 MED ORDER — DIPHENHYDRAMINE HCL 50 MG PO CAPS: 50.0000 mg | ORAL_CAPSULE | ORAL | 2 refills | Status: DC | PRN

## 2017-04-23 MED ORDER — GENTAMICIN SULFATE 0.3 % OP SOLN: 2.0000 [drp] | OPHTHALMIC | 0 refills | Status: DC

## 2017-04-23 MED ORDER — OLOPATADINE HCL 0.1 % OP SOLN: 1.0000 [drp] | Freq: Two times a day (BID) | OPHTHALMIC | 1 refills | Status: DC | PRN

## 2017-04-23 NOTE — Progress Notes (Addendum)
Name: David Santos   MRN: [id number]    DOB: November 19, 1985   Date:04/23/2017       Progress Note  Subjective  Chief Complaint  Chief Complaint  Patient presents with  . Eye Problem    Pt rub his left eye over the weekend and t became irrated. Pt caregivers stated his nails were dirty when he rub his eye. Denies any pain   . Medication Refill    Benadryl    HPI Caregiver: Clinton Sawyer provides most of the history for this patient.  He is with [facility].  Pt's mother is also present for the visit.  Eye Irritation: Pt was rubbing his LEFT eye over the weekend, and had long nails that caregiver notes were dirty.  Conjunctiva became erythematous, but pt did not complain of pain or vision changes. Caregiver states the redness has improved over the last day or so, but is still there.  Pt goes to [facility] for [facility] eye care.  Advised to continue flonase and claritin to help with itchy/watery eyes as well.  Pt denies photosensitivity, pain or itching today, denies vision changes, no fevers/chills or behavior changes. No exudate or crusting.  Insomnia/Nighttime Waking: Caregiver requests refill of benadryl. Used for sleep PRN. Reports good efficacy for nighttime wakenings and staying asleep. We will provide refill today.  Patient Active Problem List   Diagnosis Date Noted  . Catatonia 04/10/2016  . Schizophreniform disorder (HCC) 10/18/2015  . Autism spectrum disorder   . Dyslipidemia 06/28/2015  . Active autistic disorder 10/25/2014  . Benign essential hypertension 10/25/2014  . Chronic insomnia 10/25/2014  . History of acute renal failure 10/25/2014  . Dysmetabolic syndrome 10/25/2014  . Moderate mental retardation 10/25/2014  . Perennial allergic rhinitis 10/25/2014  . Obese 10/25/2014  . General psychoses (HCC) 10/25/2014  . Seborrhea capitis 10/25/2014  . Personal history of urethral stricture 10/25/2014  . Vitamin D deficiency 01/15/2009  . Testicular hypofunction  07/17/2008    Social History   Tobacco Use  . Smoking status: Never Smoker  . Smokeless tobacco: Never Used  Substance Use Topics  . Alcohol use: No    Alcohol/week: 0.0 oz    Current Outpatient Medications:  .  clonazePAM (KLONOPIN) 0.5 MG tablet, Take 1 tablet (0.5 mg total) by mouth 2 (two) times daily., Disp: 60 tablet, Rfl: 0 .  diphenhydrAMINE (BENADRYL) 50 MG capsule, Take 1 capsule (50 mg total) by mouth as needed., Disp: 30 capsule, Rfl: 2 .  divalproex (DEPAKOTE ER) 500 MG 24 hr tablet, Take 1,000 mg by mouth., Disp: , Rfl:  .  fluticasone (FLONASE) 50 MCG/ACT nasal spray, SPRAY 2 SPRAYS INTO BOTH NOSTRILS EVERY DAY, Disp: 16 g, Rfl: 11 .  lisinopril (PRINIVIL,ZESTRIL) 20 MG tablet, TAKE ONE TABLET BY MOUTH EVERY DAY, Disp: 30 tablet, Rfl: 10 .  risperiDONE (RISPERDAL) 4 MG tablet, Take 1 tablet (4 mg total) by mouth at bedtime., Disp: 30 tablet, Rfl: 0 .  gentamicin (GARAMYCIN) 0.3 % ophthalmic solution, Place 2 drops into the left eye every 4 (four) hours., Disp: 5 mL, Rfl: 0 .  loratadine (CLARITIN) 10 MG tablet, TAKE ONE TABLET BY MOUTH EVERY DAY (Patient not taking: Reported on 04/23/2017), Disp: 30 tablet, Rfl: 11 .  olopatadine (PATANOL) 0.1 % ophthalmic solution, Place 1 drop into both eyes 2 (two) times daily as needed for allergies (eye itching)., Disp: 5 mL, Rfl: 1  Allergies  Allergen Reactions  . Hydrochlorothiazide     Other reaction(s): Other (  See Comments) Acute renal failure    ROS  Constitutional: Negative for fever or weight change.  Respiratory: Negative for cough and shortness of breath.   Cardiovascular: Negative for chest pain or palpitations.  Gastrointestinal: Negative for abdominal pain, no bowel changes.  Musculoskeletal: Negative for gait problem or joint swelling.  Skin: Negative for rash.  Neurological: Negative for dizziness or headache.  No other specific complaints in a complete review of systems (except as listed in HPI  above).  Objective  Vitals:   04/23/17 0845  BP: 110/68  Pulse: 97  Resp: 14  Temp: 98 F (36.7 C)  TempSrc: Oral  SpO2: 98%  Weight: 131 lb 8 oz (59.6 kg)  Height: 5\' 5"  (1.651 m)   Body mass index is 21.88 kg/m.  Nursing Note and Vital Signs reviewed.  Physical Exam Constitutional: Patient appears well-developed and well-nourished.  No distress.  HEENT: head atraumatic, normocephalic, pupils equal and reactive to light, EOM's intact, LEFT conjunctiva mildly erythematous, no exudate present.  No maxillary or frontal sinus pain on palpation, neck supple without lymphadenopathy, oropharynx pink and moist without exudate Cardiovascular: Normal rate, regular rhythm, S1/S2 present.  No murmur or rub heard. No BLE edema. Pulmonary/Chest: Effort normal and breath sounds clear. No respiratory distress or retractions. Psychiatric: Patient has a normal mood and affect. behavior is normal. Judgment and thought content normal.  Recent Results (from the past 2160 hour(s))  POCT HgB A1C     Status: Normal   Collection Time: 02/13/17 11:42 AM  Result Value Ref Range   Hemoglobin A1C 5.0   Lipid panel     Status: Abnormal   Collection Time: 02/13/17 11:47 AM  Result Value Ref Range   Cholesterol 160 <200 mg/dL   HDL 48 >16>40 mg/dL   Triglycerides 109158 (H) <150 mg/dL   LDL Cholesterol (Calc) 86 mg/dL (calc)    Comment: Reference range: <100 . Desirable range <100 mg/dL for primary prevention;   <70 mg/dL for patients with CHD or diabetic patients  with > or = 2 CHD risk factors. Marland Kitchen. LDL-C is now calculated using the Martin-Hopkins  calculation, which is a validated novel method providing  better accuracy than the Friedewald equation in the  estimation of LDL-C.  Horald PollenMartin SS et al. Lenox AhrJAMA. 6045;409(812013;310(19): 2061-2068  (http://education.[facility].com/faq/FAQ164)    Total CHOL/HDL Ratio 3.3 <5.0 (calc)   Non-HDL Cholesterol (Calc) 112 <130 mg/dL (calc)    Comment: For patients with  diabetes plus 1 major ASCVD risk  factor, treating to a non-HDL-C goal of <100 mg/dL  (LDL-C of <19<70 mg/dL) is considered a therapeutic  option.      Visual Acuity Screening   Right eye Left eye Both eyes  Without correction:     With correction: 20/40 20/30 20/30   Comments: Using shapes   Assessment & Plan  1. Irritation of left eye - Visual acuity screening - Bilateral corrected 20/30, RIGHT corrected 20/10, LEFT corrected 20/30. - gentamicin (GARAMYCIN) 0.3 % ophthalmic solution; Place 2 drops into the left eye every 4 (four) hours.  Dispense: 5 mL; Refill: 0 - Advised that due to the nature of the injury - long/unclean fingernails, we will treat for infection at this time.  - If not improving over the next 1-3 days, will call office and we will refer [facility] for further evaluation.  2. Itchy eyes - olopatadine (PATANOL) 0.1 % ophthalmic solution; Place 1 drop into both eyes 2 (two) times daily as needed for allergies (eye itching).  Dispense: 5 mL; Refill: 1  3. Insomnia, unspecified type - diphenhydrAMINE (BENADRYL) 50 MG capsule; Take 1 capsule (50 mg total) by mouth as needed.  Dispense: 30 capsule; Refill: 2  -Red flags and when to present for emergency care or RTC including fever >101.79F, chest pain, shortness of breath, new/worsening/un-resolving symptoms, change in vision, eye pain or swelling, reviewed with patient at time of visit. Follow up and care instructions discussed and provided in AVS.

## 2017-04-23 NOTE — Patient Instructions (Addendum)
IF NOT IMPROVING IN 1-3 DAYS, PLEASE CALL OUR OFFICE AND WE WILL REFER TO [facility].  How to Use Eye Drops and Eye Ointments How to apply eye drops Follow these steps when applying eye drops: 1. Wash your hands. 2. Tilt your head back. 3. Put a finger under your eye and use it to gently pull your lower lid downward. Keep that finger in place. 4. Using your other hand, hold the dropper between your thumb and index finger. 5. Position the dropper just over the edge of the lower lid. Hold it as close to your eye as you can without touching the dropper to your eye. 6. Steady your hand. One way to do this is to lean your index finger against your brow. 7. Look up. 8. Slowly and gently squeeze one drop of medicine into your eye. 9. Close your eye. 10. Place a finger between your lower eyelid and your nose. Press gently for 2 minutes. This increases the amount of time that the medicine is exposed to the eye. It also reduces side effects that can develop if the drop gets into the bloodstream through the nose.  How to apply eye ointments Follow these steps when applying eye ointments: 1. Wash your hands. 2. Put a finger under your eye and use it to gently pull your lower lid downward. Keep that finger in place. 3. Using your other hand, place the tip of the tube between your thumb and index finger with the remaining fingers braced against your cheek or nose. 4. Hold the tube just over the edge of your lower lid without touching the tube to your lid or eyeball. 5. Look up. 6. Line the inner part of your lower lid with ointment. 7. Gently pull up on your upper lid and look down. This will force the ointment to spread over the surface of the eye. 8. Release the upper lid. 9. If you can, close your eyes for 1-2 minutes.  Do not rub your eyes. If you applied the ointment correctly, your vision will be blurry for a few minutes. This is normal. Additional information  Make sure to use the eye drops  or ointment as told by your health care provider.  If you have been told to use both eye drops and an eye ointment, apply the eye drops first, then wait 3-4 minutes before you apply the ointment.  Try not to touch the tip of the dropper or tube to your eye. A dropper or tube that has touched the eye can become contaminated. This information is not intended to replace advice given to you by your health care provider. Make sure you discuss any questions you have with your health care provider. Document Released: 07/31/2000 Document Revised: 09/23/2015 Document Reviewed: 04/20/2014 Elsevier Interactive Patient Education  [employer]2018 [employer]

## 2017-04-26 ENCOUNTER — Ambulatory Visit (INDEPENDENT_AMBULATORY_CARE_PROVIDER_SITE_OTHER): Payer: Medicare Other | Admitting: Family Medicine

## 2017-04-26 ENCOUNTER — Encounter: Payer: Self-pay | Admitting: Family Medicine

## 2017-04-26 VITALS — BP 118/74 | HR 123 | Temp 98.0°F | Resp 18 | Ht 65.0 in | Wt 129.8 lb

## 2017-04-26 DIAGNOSIS — Z0001 Encounter for general adult medical examination with abnormal findings: Secondary | ICD-10-CM | POA: Diagnosis not present

## 2017-04-26 DIAGNOSIS — Z0000 Encounter for general adult medical examination without abnormal findings: Secondary | ICD-10-CM

## 2017-04-26 DIAGNOSIS — H5789 Other specified disorders of eye and adnexa: Secondary | ICD-10-CM

## 2017-04-26 NOTE — Patient Instructions (Signed)
Preventive Care 18-39 Years, Male Preventive care refers to lifestyle choices and visits with your health care provider that can promote health and wellness. What does preventive care include?  A yearly physical exam. This is also called an annual well check.  Dental exams once or twice a year.  Routine eye exams. Ask your health care provider how often you should have your eyes checked.  Personal lifestyle choices, including: ? Daily care of your teeth and gums. ? Regular physical activity. ? Eating a healthy diet. ? Avoiding tobacco and drug use. ? Limiting alcohol use. ? Practicing safe sex. What happens during an annual well check? The services and screenings done by your health care provider during your annual well check will depend on your age, overall health, lifestyle risk factors, and family history of disease. Counseling Your health care provider may ask you questions about your:  Alcohol use.  Tobacco use.  Drug use.  Emotional well-being.  Home and relationship well-being.  Sexual activity.  Eating habits.  Work and work Statistician.  Screening You may have the following tests or measurements:  Height, weight, and BMI.  Blood pressure.  Lipid and cholesterol levels. These may be checked every 5 years starting at age 34.  Diabetes screening. This is done by checking your blood sugar (glucose) after you have not eaten for a while (fasting).  Skin check.  Hepatitis C blood test.  Hepatitis B blood test.  Sexually transmitted disease (STD) testing.  Discuss your test results, treatment options, and if necessary, the need for more tests with your health care provider. Vaccines Your health care provider may recommend certain vaccines, such as:  Influenza vaccine. This is recommended every year.  Tetanus, diphtheria, and acellular pertussis (Tdap, Td) vaccine. You may need a Td booster every 10 years.  Varicella vaccine. You may need this if you  have not been vaccinated.  HPV vaccine. If you are 23 or younger, you may need three doses over 6 months.  Measles, mumps, and rubella (MMR) vaccine. You may need at least one dose of MMR.You may also need a second dose.  Pneumococcal 13-valent conjugate (PCV13) vaccine. You may need this if you have certain conditions and have not been vaccinated.  Pneumococcal polysaccharide (PPSV23) vaccine. You may need one or two doses if you smoke cigarettes or if you have certain conditions.  Meningococcal vaccine. One dose is recommended if you are age 65-21 years and a first-year college student living in a residence hall, or if you have one of several medical conditions. You may also need additional booster doses.  Hepatitis A vaccine. You may need this if you have certain conditions or if you travel or work in places where you may be exposed to hepatitis A.  Hepatitis B vaccine. You may need this if you have certain conditions or if you travel or work in places where you may be exposed to hepatitis B.  Haemophilus influenzae type b (Hib) vaccine. You may need this if you have certain risk factors.  Talk to your health care provider about which screenings and vaccines you need and how often you need them. This information is not intended to replace advice given to you by your health care provider. Make sure you discuss any questions you have with your health care provider. Document Released: 06/20/2001 Document Revised: 01/12/2016 Document Reviewed: 02/23/2015 Elsevier Interactive Patient Education  [employer].

## 2017-04-26 NOTE — Progress Notes (Signed)
Patient: David Santos, Male    DOB: [birth year], 31 y.o.   MRN: [id number]  Visit Date: 04/26/2017  Today's Provider: Ruel Favors, MD   Chief Complaint  Patient presents with  . FL-2 Forms    Patient will be transferring to [facility] the end of December to the beginning of January. Mother is wanting patient to be moved and needs the [facility] Paperwork filled out.    Subjective:   David Santos is a 31 y.o. male who presents today for his Subsequent Annual Wellness Visit.  Patient/Caregiver input:  Switching group homes also having eye problems  Eye irritation: he is a poor historian seen a few days ago and was given topical medication but he refuses to have it done at group home, eye is getting worse, tearing, draining yellow material and has photophobia, we will send him to eye doctor for exam. Not sure if secondary to trauma  HPI  Review of Systems   Constitutional: Negative for fever or weight change.  Respiratory: Negative for cough and shortness of breath.   Cardiovascular: Negative for chest pain or palpitations.  Gastrointestinal: Negative for abdominal pain, no bowel changes.  Musculoskeletal: Negative for gait problem or joint swelling.  Skin: Negative for rash.  Neurological: Negative for dizziness or headache.  No other specific complaints in a complete review of systems (except as listed in HPI above).  Past Medical History:  Diagnosis Date  . Allergy   . Autism   . Hyperlipidemia   . Hypertension   . Psychosis [facility])     Past Surgical History:  Procedure Laterality Date  . TONSILLECTOMY AND ADENOIDECTOMY      Family History  Problem Relation Age of Onset  . Hypertension Mother   . Cancer Mother   . Graves' disease Mother   . Mental illness Father     Social History   Socioeconomic History  . Marital status: Single    Spouse name: Not on file  . Number of children: 0  . Years of education: Not on file  . Highest education level: Not on  file  Social Needs  . Financial resource strain: Not very hard  . Food insecurity - worry: Never true  . Food insecurity - inability: Never true  . Transportation needs - medical: No  . Transportation needs - non-medical: No  Occupational History  . Not on file  Tobacco Use  . Smoking status: Never Smoker  . Smokeless tobacco: Never Used  Substance and Sexual Activity  . Alcohol use: No    Alcohol/week: 0.0 oz  . Drug use: No  . Sexual activity: No  Other Topics Concern  . Not on file  Social History Narrative   Lives in a group home    Outpatient Encounter Medications as of 04/26/2017  Medication Sig Note  . clonazePAM (KLONOPIN) 0.5 MG tablet Take 1 tablet (0.5 mg total) by mouth 2 (two) times daily.   . diphenhydrAMINE (BENADRYL) 50 MG capsule Take 1 capsule (50 mg total) by mouth as needed.   . divalproex (DEPAKOTE ER) 500 MG 24 hr tablet Take 1,000 mg by mouth. 05/15/2016: Received from: [facility] Received Sig: Take 2 tablets (1,000 mg total) by mouth nightly.  . fluticasone (FLONASE) 50 MCG/ACT nasal spray SPRAY 2 SPRAYS INTO BOTH NOSTRILS EVERY DAY   . gentamicin (GARAMYCIN) 0.3 % ophthalmic solution Place 2 drops into the left eye every 4 (four) hours.   Marland Kitchen lisinopril (PRINIVIL,ZESTRIL) 20 MG tablet  TAKE ONE TABLET BY MOUTH EVERY DAY   . olopatadine (PATANOL) 0.1 % ophthalmic solution Place 1 drop into both eyes 2 (two) times daily as needed for allergies (eye itching).   . risperiDONE (RISPERDAL) 4 MG tablet Take 1 tablet (4 mg total) by mouth at bedtime.   Marland Kitchen loratadine (CLARITIN) 10 MG tablet TAKE ONE TABLET BY MOUTH EVERY DAY (Patient not taking: Reported on 04/23/2017)    No facility-administered encounter medications on file as of 04/26/2017.     Allergies  Allergen Reactions  . Hydrochlorothiazide     Other reaction(s): Other (See Comments) Acute renal failure    Care Team Updated in EHR: Yes  Last Vision Exam: not recently  Wears corrective lenses:  Yes Last Dental Exam: not recently  Last Hearing Exam: not sure  Wears Hearing Aids: No  Functional Ability / Safety Screening 1.  Was the timed Get Up and Go test longer than 30 seconds?  no 2.  Does the patient need help with the phone, transportation, shopping,      preparing meals, housework, laundry, medications, or managing money?  yes 3.  Does the patient's home have:  loose throw rugs in the hallway?   no      Grab bars in the bathroom? yes      Handrails on the stairs?   yes      Poor lighting?   no 4.  Has the patient noticed any hearing difficulties?   no  Diet Recall and Exercise Regimen: unable to give information, came with mother, but lives in a group home, switching to [facility] now  Fall Risk:  See screening under Objective Information  Depression Screen:  Sees psychiatrist  Advanced Directives: A voluntary discussion about advance care planning including the explanation and discussion of advance directives was discussed with the patient. Explanation about the health care proxy and living will was reviewed.  During this discussion, the patient was able to identify a health care proxy as mother( legal guardian)  and plans/does not plan to fill out the paperwork required and will bring this to our office to keep on file. Does patient have a HCPOA?    Mother  If yes, name and contact information:  Does patient have a living will or MOST form?  no  Cancer Screenings: Skin:no new lesions Lung: Low Dose CT Chest recommended if Age 67-80 years, 30 pack-year currently smoking OR have quit w/in 15years. Patient does not qualify.  Lifestyle risk factor issued reviewed: Diet, exercise, weight management, advised patient smoking is not healthy, nutrition/diet.   Prostate: N/A Colon: N/A  Additional Screenings:  Hepatitis B/HIV/Syphillis: not sexually active Hepatitis C Screening: no Intimate Partner Violence: negative screen  AAA Screen: Men age 80 to 75 years if ever  smoked recommended to get a one time AAA ultrasound screening exam. Patient does not qualify.  Objective:   Vitals: BP 118/74 (BP Location: Left Arm, Patient Position: Sitting, Cuff Size: Normal)   Pulse (!) 123   Temp 98 F (36.7 C) (Oral)   Resp 18   Ht 5\' 5"  (1.651 m)   Wt 129 lb 12.8 oz (58.9 kg)   SpO2 99%   BMI 21.60 kg/m  Body mass index is 21.6 kg/m.  Lab Results  Component Value Date   CHOL 160 02/13/2017   CHOL 177 02/07/2016   CHOL 199 12/16/2014   Lab Results  Component Value Date   HDL 48 02/13/2017   HDL 37 (L) 02/07/2016  HDL 33 (L) 12/16/2014   Lab Results  Component Value Date   LDLCALC 111 02/07/2016   LDLCALC 128 (H) 12/16/2014   LDLCALC 102 08/25/2013   Lab Results  Component Value Date   TRIG 158 (H) 02/13/2017   TRIG 147 02/07/2016   TRIG 190 (H) 12/16/2014   Lab Results  Component Value Date   CHOLHDL 3.3 02/13/2017   CHOLHDL 4.8 02/07/2016   CHOLHDL 6.0 (H) 12/16/2014   No results found for: LDLDIRECT  No exam data present  Physical Exam Constitutional: Patient appears well-developed and well-nourished. No distress.  HEENT: head atraumatic, normocephalic, conjunctiva is red, tearing, photophobia, drainage from corner of eye,  neck supple, throat within normal limits Cardiovascular: Normal rate, regular rhythm and normal heart sounds.  No murmur heard. No BLE edema. Pulmonary/Chest: Effort normal and breath sounds normal. No respiratory distress. Abdominal: Soft.  There is no tenderness. Skin: pilling of face, seborrhea Psychiatric: Patient has a normal mood and affect. behavior is normal. Judgment and thought content normal.   Cognitive Testing - 6-CIT  Correct? Score   What year is it? no 4 Yes = 0    No = 4  What month is it? no 3 Yes = 0    No = 3  Remember:     Floyde ParkinsJohn Smith, [address] [place], [place]     What time is it? yes 0 Yes = 0    No = 3  Count backwards from 20 to 1 no 4 Correct = 0    1 error = 2   More than 1 error  = 4  Say the months of the year in reverse. no 4 Correct = 0    1 error = 2   More than 1 error = 4  What address did I ask you to remember? no 10 Correct = 0  1 error = 2    2 error = 4    3 error = 6    4 error = 8    All wrong = 10       TOTAL SCORE  25/28   Interpretation:  Abnormal- he has mental retardation   Normal (0-7) Abnormal (8-28)   Fall Risk: Fall Risk  04/23/2017 02/13/2017 11/13/2016 07/25/2016 05/15/2016  Falls in the past year? No No No No No    Depression Screen [facility] screen [facility] 04/23/2017 11/13/2016 07/25/2016 05/15/2016 02/07/2016  Decreased Interest 0 0 0 0 0  Down, Depressed, Hopeless 0 0 0 0 0  PHQ - 2 Score 0 0 0 0 0    Recent Results (from the past 2160 hour(s))  POCT HgB A1C     Status: Normal   Collection Time: 02/13/17 11:42 AM  Result Value Ref Range   Hemoglobin A1C 5.0   Lipid panel     Status: Abnormal   Collection Time: 02/13/17 11:47 AM  Result Value Ref Range   Cholesterol 160 <200 mg/dL   HDL 48 >95>40 mg/dL   Triglycerides 638158 (H) <150 mg/dL   LDL Cholesterol (Calc) 86 mg/dL (calc)    Comment: Reference range: <100 . Desirable range <100 mg/dL for primary prevention;   <70 mg/dL for patients with CHD or diabetic patients  with > or = 2 CHD risk factors. Marland Kitchen. LDL-C is now calculated using the Martin-Hopkins  calculation, which is a validated novel method providing  better accuracy than the Friedewald equation in the  estimation of LDL-C.  Horald PollenMartin SS et al. Lenox AhrJAMA.  1610;960(452013;310(19): 2061-2068  (http://education.[employer].com/faq/FAQ164)    Total CHOL/HDL Ratio 3.3 <5.0 (calc)   Non-HDL Cholesterol (Calc) 112 <130 mg/dL (calc)    Comment: For patients with diabetes plus 1 major ASCVD risk  factor, treating to a non-HDL-C goal of <100 mg/dL  (LDL-C of <40<70 mg/dL) is considered a therapeutic  option.      Assessment & Plan:     1. Medicare annual wellness visit, subsequent  Discussed importance of 150 minutes of physical activity  weekly, eat two servings of fish weekly, eat one serving of tree nuts ( cashews, pistachios, pecans, almonds.Marland Kitchen.) every other day, eat 6 servings of fruit/vegetables daily and drink plenty of water and avoid sweet beverages.   2. Irritation of left eye  - Ambulatory referral to Ophthalmology     Immunization History  Administered Date(s) Administered  . Influenza,inj,Quad PF,6+ Mos 02/13/2017  . Tdap 05/18/2011    Health Maintenance  Topic Date Due  . TETANUS/TDAP  05/17/2021  . INFLUENZA VACCINE  Completed  . HIV Screening  Completed     No orders of the defined types were placed in this encounter.   Current Outpatient Medications:  .  clonazePAM (KLONOPIN) 0.5 MG tablet, Take 1 tablet (0.5 mg total) by mouth 2 (two) times daily., Disp: 60 tablet, Rfl: 0 .  diphenhydrAMINE (BENADRYL) 50 MG capsule, Take 1 capsule (50 mg total) by mouth as needed., Disp: 30 capsule, Rfl: 2 .  divalproex (DEPAKOTE ER) 500 MG 24 hr tablet, Take 1,000 mg by mouth., Disp: , Rfl:  .  fluticasone (FLONASE) 50 MCG/ACT nasal spray, SPRAY 2 SPRAYS INTO BOTH NOSTRILS EVERY DAY, Disp: 16 g, Rfl: 11 .  gentamicin (GARAMYCIN) 0.3 % ophthalmic solution, Place 2 drops into the left eye every 4 (four) hours., Disp: 5 mL, Rfl: 0 .  lisinopril (PRINIVIL,ZESTRIL) 20 MG tablet, TAKE ONE TABLET BY MOUTH EVERY DAY, Disp: 30 tablet, Rfl: 10 .  olopatadine (PATANOL) 0.1 % ophthalmic solution, Place 1 drop into both eyes 2 (two) times daily as needed for allergies (eye itching)., Disp: 5 mL, Rfl: 1 .  risperiDONE (RISPERDAL) 4 MG tablet, Take 1 tablet (4 mg total) by mouth at bedtime., Disp: 30 tablet, Rfl: 0 .  loratadine (CLARITIN) 10 MG tablet, TAKE ONE TABLET BY MOUTH EVERY DAY (Patient not taking: Reported on 04/23/2017), Disp: 30 tablet, Rfl: 11 There are no discontinued medications.  I have personally reviewed and addressed the Medicare Annual Wellness health risk assessment questionnaire and have noted the  following in the patient's chart:  A.         Medical and social history & family history B.         Use of alcohol, tobacco or illicit drugs  C.         Current medications and supplements D.         Functional and Cognitive ability and status E.         Nutritional status F.         Physical activity G.        Advance directives H.         List of other physicians I.          Hospitalizations, surgeries, and ER visits in previous 12 months  None  J.         Vitals K.         Screenings such as hearing and vision if needed, cognitive and depression L.  Referrals and appointments -   In addition, I have reviewed and discussed with patient certain preventive protocols, quality metrics, and best practice recommendations. A written personalized care plan for preventive services as well as general preventive health recommendations were provided to patient.  See attached scanned questionnaire for additional information.

## 2017-05-15 ENCOUNTER — Encounter: Payer: Self-pay | Admitting: Family Medicine

## 2017-05-16 ENCOUNTER — Encounter: Payer: Self-pay | Admitting: Family Medicine

## 2017-05-16 ENCOUNTER — Ambulatory Visit (INDEPENDENT_AMBULATORY_CARE_PROVIDER_SITE_OTHER): Payer: Medicare Other | Admitting: Family Medicine

## 2017-05-16 VITALS — BP 104/70 | HR 106 | Resp 14 | Ht 65.0 in | Wt 138.4 lb

## 2017-05-16 DIAGNOSIS — F2081 Schizophreniform disorder: Secondary | ICD-10-CM

## 2017-05-16 DIAGNOSIS — F840 Autistic disorder: Secondary | ICD-10-CM

## 2017-05-16 DIAGNOSIS — H5789 Other specified disorders of eye and adnexa: Secondary | ICD-10-CM | POA: Diagnosis not present

## 2017-05-16 NOTE — Progress Notes (Signed)
Name: Kellie SimmeringDashawn K Procter   MRN: [id number]    DOB: 07/30/1985   Date:05/16/2017       Progress Note  Subjective  Chief Complaint  Chief Complaint  Patient presents with  . Eye Problem    HPI  He came in for follow up on eye irritation, [facility] and still has antibiotics and patanol on his active list but is not letting the staff give him the drops. His eye irritation has resolved, seen by ophthalmologist, no eye pain, drainage or redness. Feeling well   Patient Active Problem List   Diagnosis Date Noted  . Catatonia 04/10/2016  . Schizophreniform disorder (HCC) 10/18/2015  . Autism spectrum disorder   . Dyslipidemia 06/28/2015  . Active autistic disorder 10/25/2014  . Benign essential hypertension 10/25/2014  . Chronic insomnia 10/25/2014  . History of acute renal failure 10/25/2014  . Dysmetabolic syndrome 10/25/2014  . Moderate mental retardation 10/25/2014  . Perennial allergic rhinitis 10/25/2014  . Obese 10/25/2014  . General psychoses (HCC) 10/25/2014  . Seborrhea capitis 10/25/2014  . Personal history of urethral stricture 10/25/2014  . Vitamin D deficiency 01/15/2009  . Testicular hypofunction 07/17/2008    Social History   Tobacco Use  . Smoking status: Never Smoker  . Smokeless tobacco: Never Used  Substance Use Topics  . Alcohol use: No    Alcohol/week: 0.0 oz     Current Outpatient Medications:  .  clonazePAM (KLONOPIN) 0.5 MG tablet, Take 1 tablet (0.5 mg total) by mouth 2 (two) times daily., Disp: 60 tablet, Rfl: 0 .  diphenhydrAMINE (BENADRYL) 50 MG capsule, Take 1 capsule (50 mg total) by mouth as needed., Disp: 30 capsule, Rfl: 2 .  divalproex (DEPAKOTE ER) 500 MG 24 hr tablet, Take 1,000 mg by mouth., Disp: , Rfl:  .  fluticasone (FLONASE) 50 MCG/ACT nasal spray, SPRAY 2 SPRAYS INTO BOTH NOSTRILS EVERY DAY, Disp: 16 g, Rfl: 11 .  lisinopril (PRINIVIL,ZESTRIL) 20 MG tablet, TAKE ONE TABLET BY MOUTH EVERY DAY, Disp: 30  tablet, Rfl: 10 .  loratadine (CLARITIN) 10 MG tablet, TAKE ONE TABLET BY MOUTH EVERY DAY, Disp: 30 tablet, Rfl: 11 .  risperiDONE (RISPERDAL) 4 MG tablet, Take 1 tablet (4 mg total) by mouth at bedtime., Disp: 30 tablet, Rfl: 0  Allergies  Allergen Reactions  . Hydrochlorothiazide     Other reaction(s): Other (See Comments) Acute renal failure    ROS  Ten systems reviewed and is negative except as mentioned in HPI   Objective  Vitals:   05/16/17 1136  BP: 104/70  Pulse: (!) 106  Resp: 14  SpO2: 98%  Weight: 138 lb 6.4 oz (62.8 kg)  Height: 5\' 5"  (1.651 m)    Body mass index is 23.03 kg/m.    Physical Exam  Constitutional: Patient appears well-developed and well-nourished.No distress.  HEENT: head atraumatic, normocephalic, pupils equal and reactive to light, neck supple, throat within normal limits Cardiovascular: Normal rate, regular rhythm and normal heart sounds.  No murmur heard. No BLE edema. Pulmonary/Chest: Effort normal and breath sounds normal. No respiratory distress. Abdominal: Soft.  There is no tenderness. Psychiatric: he seems happy, smiling and with a good attitude    Assessment & Plan  1. Irritation of left eye  Resolved, normal exam today, d/c eye drops on group home forms    2. Autism spectrum disorder   3. Schizophreniform disorder [facility](HCC)  Recent switched to [facility] and is doing well, gaining weight and  seems to be happy

## 2017-06-11 DIAGNOSIS — Z79899 Other long term (current) drug therapy: Secondary | ICD-10-CM | POA: Diagnosis not present

## 2017-07-18 ENCOUNTER — Other Ambulatory Visit: Payer: Self-pay | Admitting: Family Medicine

## 2017-07-18 DIAGNOSIS — I10 Essential (primary) hypertension: Secondary | ICD-10-CM

## 2017-07-18 NOTE — Telephone Encounter (Signed)
Refill request for Hypertension medication:  Lisinopril 20 mg  Last office visit pertaining to hypertension: 02/13/2017  BP Readings from Last 3 Encounters:  05/16/17 104/70  04/26/17 118/74  04/23/17 110/68    Lab Results  Component Value Date   CREATININE 0.84 07/25/2016   BUN 15 07/25/2016   NA 139 07/25/2016   K 3.8 07/25/2016   CL 104 07/25/2016   CO2 25 07/25/2016   No Follow-up on file. 09/14/2017

## 2017-08-08 DIAGNOSIS — Z79899 Other long term (current) drug therapy: Secondary | ICD-10-CM | POA: Diagnosis not present

## 2017-09-14 ENCOUNTER — Encounter: Payer: Self-pay | Admitting: Family Medicine

## 2017-09-14 ENCOUNTER — Ambulatory Visit (INDEPENDENT_AMBULATORY_CARE_PROVIDER_SITE_OTHER): Payer: Medicare Other | Admitting: Family Medicine

## 2017-09-14 VITALS — BP 130/80 | HR 102 | Temp 98.0°F | Resp 16 | Ht 65.0 in | Wt 149.1 lb

## 2017-09-14 DIAGNOSIS — E559 Vitamin D deficiency, unspecified: Secondary | ICD-10-CM | POA: Diagnosis not present

## 2017-09-14 DIAGNOSIS — E785 Hyperlipidemia, unspecified: Secondary | ICD-10-CM

## 2017-09-14 DIAGNOSIS — F2081 Schizophreniform disorder: Secondary | ICD-10-CM

## 2017-09-14 DIAGNOSIS — R739 Hyperglycemia, unspecified: Secondary | ICD-10-CM | POA: Diagnosis not present

## 2017-09-14 DIAGNOSIS — E8881 Metabolic syndrome: Secondary | ICD-10-CM

## 2017-09-14 DIAGNOSIS — F840 Autistic disorder: Secondary | ICD-10-CM

## 2017-09-14 DIAGNOSIS — I10 Essential (primary) hypertension: Secondary | ICD-10-CM

## 2017-09-14 MED ORDER — LISINOPRIL 20 MG PO TABS: 20.0000 mg | ORAL_TABLET | Freq: Every day | ORAL | 2 refills | Status: DC

## 2017-09-14 NOTE — Progress Notes (Signed)
Name: David Santos   MRN: [id number]    DOB: 1986/05/01   Date:09/14/2017       Progress Note  Subjective  Chief Complaint  Chief Complaint  Patient presents with  . Medication Refill  . Allergic Rhinitis   . Hypertension  . Schizophrenia  . Austic Disorder    HPI  Here today with Benjamine Mola.   Hypertension Benign: he was doing well on Lisinopril , off HCTZ because it caused acute renal failure. He is compliant with medication, living in a group home now [facility]  ). He is unable to self medicate. He denies chest pain or palpitation. BP is slightly elevated today, he has gained 11 lbs, and we will monitor for now, but explained importance of exercising and avoiding salt on his diet  Moderate MR: he is now at a group home , he was admitted for psychosis in June 2017 and has been in a group home since. He sees a psychiatrist, Dr. Janeece Riggers. Reviewed medications from group home, he is off Zyprexa ,  Klonopin and topamax, seems to be doing well on Risperdal, Benadryl , depakote. Behavior has been good per caregiver that brought him today.   Metabolic Syndrome:He is eating healthier since he has been placed in group home 2017. Lost a total of 45 lbs in the previous  year, but gained 11 lbs in the past few months. He did not know that he was going from hospital to group home upon discharge, he was very depressed and did not eat - wanted to go home. He now has a good appetite.   Schizophrenia, autism: caregiver  states he does not seem depressed, he laughs and seems to be in a good mood. He does not engage with other residents because of autism, he still paces and talks to himself - stable, he is compliant with caregivers. He had to be hospitalized because of aggression. He goes to day programs and enjoys doing that.   Dyslipidemia: reviewed labs done 02/2018. LDL 86, triglycerides 158, recheck next visit   Tachycardia: stable, last TSH was normal, weight is improving.    Patient Active Problem List   Diagnosis Date Noted  . Catatonia 04/10/2016  . Schizophreniform disorder (HCC) 10/18/2015  . Autism spectrum disorder   . Dyslipidemia 06/28/2015  . Active autistic disorder 10/25/2014  . Benign essential hypertension 10/25/2014  . Chronic insomnia 10/25/2014  . History of acute renal failure 10/25/2014  . Dysmetabolic syndrome 10/25/2014  . Moderate mental retardation 10/25/2014  . Perennial allergic rhinitis 10/25/2014  . Obese 10/25/2014  . General psychoses (HCC) 10/25/2014  . Seborrhea capitis 10/25/2014  . Personal history of urethral stricture 10/25/2014  . Vitamin D deficiency 01/15/2009  . Testicular hypofunction 07/17/2008    Past Surgical History:  Procedure Laterality Date  . TONSILLECTOMY AND ADENOIDECTOMY      Family History  Problem Relation Age of Onset  . Hypertension Mother   . Cancer Mother   . Graves' disease Mother   . Mental illness Father     Social History   Socioeconomic History  . Marital status: Single    Spouse name: Not on file  . Number of children: 0  . Years of education: Not on file  . Highest education level: Not on file  Occupational History  . Not on file  Social Needs  . Financial resource strain: Not very hard  . Food insecurity:    Worry: Never true    Inability:  Never true  . Transportation needs:    Medical: No    Non-medical: No  Tobacco Use  . Smoking status: Never Smoker  . Smokeless tobacco: Never Used  Substance and Sexual Activity  . Alcohol use: No    Alcohol/week: 0.0 oz  . Drug use: No  . Sexual activity: Never  Lifestyle  . Physical activity:    Days per week: 0 days    Minutes per session: 0 min  . Stress: Patient refused  Relationships  . Social connections:    Talks on phone: Once a week    Gets together: Once a week    Attends religious service: Never    Active member of club or organization: No    Attends meetings of clubs or organizations: Never     Relationship status: Never married  . Intimate partner violence:    Fear of current or ex partner: No    Emotionally abused: No    Physically abused: No    Forced sexual activity: No  Other Topics Concern  . Not on file  Social History Narrative   Lives in a group home - [facility]      Current Outpatient Medications:  .  diphenhydrAMINE (BENADRYL) 50 MG capsule, Take 1 capsule (50 mg total) by mouth as needed., Disp: 30 capsule, Rfl: 2 .  divalproex (DEPAKOTE ER) 500 MG 24 hr tablet, Take 1,000 mg by mouth., Disp: , Rfl:  .  fluticasone (FLONASE) 50 MCG/ACT nasal spray, SPRAY 2 SPRAYS INTO BOTH NOSTRILS EVERY DAY, Disp: 16 g, Rfl: 11 .  haloperidol (HALDOL) 5 MG tablet, Take 5 mg by mouth every evening., Disp: , Rfl:  .  lisinopril (PRINIVIL,ZESTRIL) 20 MG tablet, Take 1 tablet (20 mg total) by mouth daily., Disp: 30 tablet, Rfl: 2 .  loratadine (CLARITIN) 10 MG tablet, TAKE ONE TABLET BY MOUTH EVERY DAY, Disp: 30 tablet, Rfl: 11 .  risperiDONE (RISPERDAL) 4 MG tablet, Take 1 tablet (4 mg total) by mouth at bedtime., Disp: 30 tablet, Rfl: 0  Allergies  Allergen Reactions  . Hydrochlorothiazide     Other reaction(s): Other (See Comments) Acute renal failure     ROS  Constitutional: Negative for fever, positive for  weight change.  Respiratory: Negative for cough and shortness of breath.   Cardiovascular: Negative for chest pain or palpitations.  Gastrointestinal: Negative for abdominal pain, no bowel changes.  Musculoskeletal: Negative for gait problem or joint swelling.  Skin: Negative for rash.  Neurological: Negative for dizziness or headache.  No other specific complaints in a complete review of systems (except as listed in HPI above).  Objective  Vitals:   09/14/17 0913 09/14/17 0939  BP: (!) 144/76 130/80  Pulse: (!) 102   Resp: 16   Temp: 98 F (36.7 C)   TempSrc: Oral   SpO2: 97%   Weight: 149 lb 1.6 oz (67.6 kg)   Height:  (1.651 m)      Body mass index is 24.81 kg/m.  Physical Exam  Constitutional: Patient appears well-developed and well-nourished. No distress.  HEENT: head atraumatic, normocephalic, pupils equal and reactive to light,  neck supple, throat within normal limits Cardiovascular: Normal rate, regular rhythm and normal heart sounds.  No murmur heard. No BLE edema. Pulmonary/Chest: Effort normal and breath sounds normal. No respiratory distress. Abdominal: Soft.  There is no tenderness. Psychiatric: Patient has a normal mood and affect. behavior is normal. Judgment and thought content normal.  PHQ2/9: Depression screen  [facility] 2/9 04/23/2017 11/13/2016 07/25/2016 05/15/2016 02/07/2016  Decreased Interest 0 0 0 0 0  Down, Depressed, Hopeless 0 0 0 0 0  PHQ - 2 Score 0 0 0 0 0    Fall Risk: Fall Risk  09/14/2017 05/16/2017 04/23/2017 02/13/2017 11/13/2016  Falls in the past year? No Yes No No No  Number falls in past yr: - 1 - - -  Injury with Fall? - No - - -  Follow up - Education provided - - -     Functional Status Survey: Is the patient deaf or have difficulty hearing?: No Does the patient have difficulty seeing, even when wearing glasses/contacts?: Yes Does the patient have difficulty concentrating, remembering, or making decisions?: Yes Does the patient have difficulty walking or climbing stairs?: No Does the patient have difficulty dressing or bathing?: No Does the patient have difficulty doing errands alone such as visiting a doctor's office or shopping?: Yes    Assessment & Plan   1. Autism spectrum disorder  Stable  2. Schizophreniform disorder (HCC)  Under the care of Dr. Janeece Riggers and stable  3. Dysmetabolic syndrome  Last A1C was at goal, advised to increase physical activity, in the past when obese his glucose went up  4. Benign essential hypertension  Continue current dose, has follow up with nephrologist in July, advised to monitor bp at the group home and take the log to his visit -  lisinopril (PRINIVIL,ZESTRIL) 20 MG tablet; Take 1 tablet (20 mg total) by mouth daily.  Dispense: 30 tablet; Refill: 2  5. Vitamin D deficiency  Recheck labs next visit   6. Dyslipidemia  Recheck yearly   7. Hyperglycemia

## 2018-01-03 ENCOUNTER — Other Ambulatory Visit: Payer: Self-pay

## 2018-01-03 DIAGNOSIS — J3089 Other allergic rhinitis: Secondary | ICD-10-CM

## 2018-01-03 DIAGNOSIS — G4700 Insomnia, unspecified: Secondary | ICD-10-CM

## 2018-01-03 NOTE — Telephone Encounter (Signed)
Refill request for general medication. Loratadine, Flonase, and Benadryl to [facility]   Last office visit 09/14/2017   Follow up on 04/29/2018

## 2018-01-04 MED ORDER — LORATADINE 10 MG PO TABS: 10.0000 mg | ORAL_TABLET | Freq: Every day | ORAL | 3 refills | Status: DC

## 2018-01-04 MED ORDER — FLUTICASONE PROPIONATE 50 MCG/ACT NA SUSP: NASAL | 2 refills | Status: DC

## 2018-01-04 MED ORDER — DIPHENHYDRAMINE HCL 50 MG PO CAPS: 50.0000 mg | ORAL_CAPSULE | ORAL | 2 refills | Status: DC | PRN

## 2018-01-05 ENCOUNTER — Other Ambulatory Visit: Payer: Self-pay | Admitting: Family Medicine

## 2018-01-05 DIAGNOSIS — I10 Essential (primary) hypertension: Secondary | ICD-10-CM

## 2018-03-30 ENCOUNTER — Other Ambulatory Visit: Payer: Self-pay | Admitting: Family Medicine

## 2018-03-30 DIAGNOSIS — J3089 Other allergic rhinitis: Secondary | ICD-10-CM

## 2018-03-30 DIAGNOSIS — I10 Essential (primary) hypertension: Secondary | ICD-10-CM

## 2018-04-02 ENCOUNTER — Encounter: Payer: Self-pay | Admitting: Nurse Practitioner

## 2018-04-02 ENCOUNTER — Ambulatory Visit (INDEPENDENT_AMBULATORY_CARE_PROVIDER_SITE_OTHER): Payer: Medicare Other | Admitting: Nurse Practitioner

## 2018-04-02 VITALS — BP 138/68 | HR 123 | Temp 97.9°F | Resp 16 | Ht 65.0 in | Wt 180.6 lb

## 2018-04-02 DIAGNOSIS — R0981 Nasal congestion: Secondary | ICD-10-CM

## 2018-04-02 DIAGNOSIS — R05 Cough: Secondary | ICD-10-CM | POA: Diagnosis not present

## 2018-04-02 DIAGNOSIS — J3489 Other specified disorders of nose and nasal sinuses: Secondary | ICD-10-CM | POA: Diagnosis not present

## 2018-04-02 DIAGNOSIS — H6123 Impacted cerumen, bilateral: Secondary | ICD-10-CM | POA: Diagnosis not present

## 2018-04-02 DIAGNOSIS — R059 Cough, unspecified: Secondary | ICD-10-CM

## 2018-04-02 MED ORDER — DM-GUAIFENESIN ER 30-600 MG PO TB12: 1.0000 | ORAL_TABLET | Freq: Two times a day (BID) | ORAL | 1 refills | Status: DC

## 2018-04-02 MED ORDER — BENZONATATE 100 MG PO CAPS: 100.0000 mg | ORAL_CAPSULE | Freq: Two times a day (BID) | ORAL | 0 refills | Status: DC | PRN

## 2018-04-02 MED ORDER — CARBAMIDE PEROXIDE 6.5 % OT SOLN: 5.0000 [drp] | Freq: Two times a day (BID) | OTIC | 1 refills | Status: DC

## 2018-04-02 NOTE — Progress Notes (Signed)
Name: David SimmeringDashawn K Santos   MRN: [id number]    DOB: 02/16/1986   Date:04/02/2018       Progress Note  Subjective  Chief Complaint  Chief Complaint  Patient presents with  . Sinus Problem    Onset-1 week, sinus pressure and congestion. Dry Coughing, SOB. Has been taking his allergy medication and nasal spray with no relief.     HPI  Patient presents for cough ongoing 3 days and runny nose. Taking claritin  No relief. Facial pain.   No trouble breathing, fevers, chills,   Patient Active Problem List   Diagnosis Date Noted  . Catatonia 04/10/2016  . Schizophreniform disorder (HCC) 10/18/2015  . Autism spectrum disorder   . Dyslipidemia 06/28/2015  . Active autistic disorder 10/25/2014  . Benign essential hypertension 10/25/2014  . Chronic insomnia 10/25/2014  . History of acute renal failure 10/25/2014  . Dysmetabolic syndrome 10/25/2014  . Moderate mental retardation 10/25/2014  . Perennial allergic rhinitis 10/25/2014  . Obese 10/25/2014  . General psychoses (HCC) 10/25/2014  . Seborrhea capitis 10/25/2014  . Personal history of urethral stricture 10/25/2014  . Vitamin D deficiency 01/15/2009  . Testicular hypofunction 07/17/2008    Past Medical History:  Diagnosis Date  . Allergy   . Autism   . Hyperlipidemia   . Hypertension   . Psychosis [facility](HCC)     Past Surgical History:  Procedure Laterality Date  . TONSILLECTOMY AND ADENOIDECTOMY      Social History   Tobacco Use  . Smoking status: Never Smoker  . Smokeless tobacco: Never Used  Substance Use Topics  . Alcohol use: No    Alcohol/week: 0.0 standard drinks     Current Outpatient Medications:  .  diphenhydrAMINE (BENADRYL) 50 MG capsule, Take 1 capsule (50 mg total) by mouth as needed., Disp: 30 capsule, Rfl: 2 .  divalproex (DEPAKOTE ER) 500 MG 24 hr tablet, Take 1,000 mg by mouth., Disp: , Rfl:  .  fluticasone (FLONASE) 50 MCG/ACT nasal spray, SPRAY 2 SPRAYS INTO BOTH NOSTRILS EVERY DAY, Disp: 16 g,  Rfl: 2 .  haloperidol (HALDOL) 5 MG tablet, Take 5 mg by mouth every evening., Disp: , Rfl:  .  lisinopril (PRINIVIL,ZESTRIL) 20 MG tablet, TAKE ONE TABLET BY MOUTH EVERY DAY, Disp: 31 tablet, Rfl: 0 .  loratadine (CLARITIN) 10 MG tablet, TAKE ONE TABLET BY MOUTH EVERY DAY, Disp: 31 tablet, Rfl: 0 .  risperiDONE (RISPERDAL) 4 MG tablet, Take 1 tablet (4 mg total) by mouth at bedtime., Disp: 30 tablet, Rfl: 0  Allergies  Allergen Reactions  . Hydrochlorothiazide     Other reaction(s): Other (See Comments) Acute renal failure    ROS  No other specific complaints in a complete review of systems (except as listed in HPI above).  Objective  Vitals:   04/02/18 0912  BP: 138/68  Pulse: (!) 123  Resp: 16  Temp: 97.9 F (36.6 C)  TempSrc: Oral  SpO2: 98%  Weight: 180 lb 9.6 oz (81.9 kg)  Height: 5\' 5"  (1.651 m)    Body mass index is 30.05 kg/m.  Nursing Note and Vital Signs reviewed.  Physical Exam  Constitutional: He is oriented to person, place, and time. He appears well-developed and well-nourished. He is cooperative.  HENT:  Head: Normocephalic and atraumatic.  Right Ear: Hearing normal. Right ear decreased hearing: bilaterally cerumen impacted.  Left Ear: Hearing normal.  Nose: Mucosal edema and rhinorrhea present. Right sinus exhibits no maxillary sinus tenderness and no frontal sinus tenderness. Left  sinus exhibits no maxillary sinus tenderness and no frontal sinus tenderness.  Mouth/Throat: Uvula is midline, oropharynx is clear and moist and mucous membranes are normal. No posterior oropharyngeal edema or posterior oropharyngeal erythema.  Eyes: Conjunctivae are normal.  Cardiovascular: Regular rhythm and normal heart sounds.  Elevated rate 112bpm  Pulmonary/Chest: Effort normal and breath sounds normal. No respiratory distress. He has no wheezes. He exhibits no tenderness.  Abdominal: Normal appearance.  Neurological: He is alert and oriented to person, place, and  time.  Psychiatric: He has a normal mood and affect. His speech is normal and behavior is normal. Judgment and thought content normal.     No results found for this or any previous visit (from the past 48 hour(s)).  Assessment & Plan  1. Cough - benzonatate (TESSALON) 100 MG capsule; Take 1 capsule (100 mg total) by mouth 2 (two) times daily as needed for cough.  Dispense: 30 capsule; Refill: 0 - dextromethorphan-guaiFENesin (MUCINEX DM) 30-600 MG 12hr tablet; Take 1 tablet by mouth 2 (two) times daily.  Dispense: 14 tablet; Refill: 1  2. Nasal congestion - dextromethorphan-guaiFENesin (MUCINEX DM) 30-600 MG 12hr tablet; Take 1 tablet by mouth 2 (two) times daily.  Dispense: 14 tablet; Refill: 1  3. Sinus pressure Continue claratin and flonase,   4. Bilateral impacted cerumen - carbamide peroxide (DEBROX) 6.5 % OTIC solution; Place 5 drops into both ears 2 (two) times daily.  Dispense: 15 mL; Refill: 1   Reviewed precious notes- patient heart rate appears to be baseline for him, no acute distress noted. Discussed with care provider in room.  -Red flags and when to present for emergency care or RTC including fever >101.69F, chest pain, shortness of breath, new/worsening/un-resolving symptoms,  reviewed with patient at time of visit. Follow up and care instructions discussed and provided in AVS.

## 2018-04-02 NOTE — Patient Instructions (Addendum)
You likely have a viral upper respiratory infection (URI). Antibiotics will not reduce the number of days you are ill or prevent you from getting bacterial rhinosinusitis. A URI can take up to 14 days to resolve, but typically last between 7-11 days. Your body is so smart and strong that it will be fighting this illness off for you but it is important that you drink plenty of fluids, rest. Cover your nose/mouth when you cough or sneeze and wash your hands well and often. Here are some helpful things you can use or pick up over the counter from the pharmacy to help with your symptoms:   For Fever/Pain: Acetaminophen every 6 hours as needed (maximum of 3000mg  a day). If you are still uncomfortable you can add ibuprofen OR naproxen  For coughing: try dextromethorphan for a cough suppressant, and/or a cool mist humidifier, lozenges  For sore throat: saline gargles, honey herbal tea, lozenges, throat spray  To dry out your nose: try an antihistamine like loratadine (non-sedating) or diphenhydramine (sedating) or others To relieve a stuffy nose: try an oral decongestant  Like pseudoephedrine if you are under the age of 30 and do not have high blood pressure, neti pot To make blowing your nose easier: guaifenesin   - If having shortness of breath, fevers, chills, or symptoms unimproved after 3-4 days please call and let us know.    For earwax build up use ONE of these over the counter options to soften up with ear wax in your ear: mineral oil drops, docusate (liquid), debrox. Place a 2 drops in your ear while laying on your left side- Keep laying for 30-60 minutes on that side after application. Can use once daily to soften ear wax. Wash in shower afterwards,  . do not stick anything in your ear as it further impact the ear wax

## 2018-04-29 ENCOUNTER — Encounter: Payer: Self-pay | Admitting: Family Medicine

## 2018-04-29 ENCOUNTER — Ambulatory Visit (INDEPENDENT_AMBULATORY_CARE_PROVIDER_SITE_OTHER): Payer: Medicare Other | Admitting: Family Medicine

## 2018-04-29 VITALS — BP 122/80 | HR 99 | Temp 98.1°F | Ht 65.0 in | Wt 183.9 lb

## 2018-04-29 DIAGNOSIS — E8881 Metabolic syndrome: Secondary | ICD-10-CM

## 2018-04-29 DIAGNOSIS — E6609 Other obesity due to excess calories: Secondary | ICD-10-CM | POA: Diagnosis not present

## 2018-04-29 DIAGNOSIS — Z0000 Encounter for general adult medical examination without abnormal findings: Secondary | ICD-10-CM | POA: Diagnosis not present

## 2018-04-29 DIAGNOSIS — Z1159 Encounter for screening for other viral diseases: Secondary | ICD-10-CM

## 2018-04-29 DIAGNOSIS — Z23 Encounter for immunization: Secondary | ICD-10-CM | POA: Diagnosis not present

## 2018-04-29 DIAGNOSIS — R635 Abnormal weight gain: Secondary | ICD-10-CM

## 2018-04-29 DIAGNOSIS — R7309 Other abnormal glucose: Secondary | ICD-10-CM

## 2018-04-29 DIAGNOSIS — F2081 Schizophreniform disorder: Secondary | ICD-10-CM

## 2018-04-29 DIAGNOSIS — E559 Vitamin D deficiency, unspecified: Secondary | ICD-10-CM | POA: Diagnosis not present

## 2018-04-29 DIAGNOSIS — Z6830 Body mass index (BMI) 30.0-30.9, adult: Secondary | ICD-10-CM

## 2018-04-29 DIAGNOSIS — F71 Moderate intellectual disabilities: Secondary | ICD-10-CM

## 2018-04-29 NOTE — Patient Instructions (Addendum)
Exercising to Lose Weight Exercise is structured, repetitive physical activity to improve fitness and health. Getting regular exercise is important for everyone. It is especially important if you are overweight. Being overweight increases your risk of heart disease, stroke, diabetes, high blood pressure, and several types of cancer. Reducing your calorie intake and exercising can help you lose weight. Exercise is usually categorized as moderate or vigorous intensity. To lose weight, most people need to do a certain amount of moderate-intensity or vigorous-intensity exercise each week. Moderate-intensity exercise  Moderate-intensity exercise is any activity that gets you moving enough to burn at least three times more energy (calories) than if you were sitting. Examples of moderate exercise include:  Walking a mile in 15 minutes.  Doing light yard work.  Biking at an easy pace. Most people should get at least 150 minutes (2 hours and 30 minutes) a week of moderate-intensity exercise to maintain their body weight. Vigorous-intensity exercise Vigorous-intensity exercise is any activity that gets you moving enough to burn at least six times more calories than if you were sitting. When you exercise at this intensity, you should be working hard enough that you are not able to carry on a conversation. Examples of vigorous exercise include:  Running.  Playing a team sport, such as football, basketball, and soccer.  Jumping rope. Most people should get at least 75 minutes (1 hour and 15 minutes) a week of vigorous-intensity exercise to maintain their body weight. How can exercise affect me? When you exercise enough to burn more calories than you eat, you lose weight. Exercise also reduces body fat and builds muscle. The more muscle you have, the more calories you burn. Exercise also:  Improves mood.  Reduces stress and tension.  Improves your overall fitness, flexibility, and  endurance.  Increases bone strength. The amount of exercise you need to lose weight depends on:  Your age.  The type of exercise.  Any health conditions you have.  Your overall physical ability. Talk to your health care provider about how much exercise you need and what types of activities are safe for you. What actions can I take to lose weight? Nutrition   Make changes to your diet as told by your health care provider or diet and nutrition specialist (dietitian). This may include: ? Eating fewer calories. ? Eating more protein. ? Eating less unhealthy fats. ? Eating a diet that includes fresh fruits and vegetables, whole grains, low-fat dairy products, and lean protein. ? Avoiding foods with added fat, salt, and sugar.  Drink plenty of water while you exercise to prevent dehydration or heat stroke. Activity  Choose an activity that you enjoy and set realistic goals. Your health care provider can help you make an exercise plan that works for you.  Exercise at a moderate or vigorous intensity most days of the week. ? The intensity of exercise may vary from person to person. You can tell how intense a workout is for you by paying attention to your breathing and heartbeat. Most people will notice their breathing and heartbeat get faster with more intense exercise.  Do resistance training twice each week, such as: ? Push-ups. ? Sit-ups. ? Lifting weights. ? Using resistance bands.  Getting short amounts of exercise can be just as helpful as long structured periods of exercise. If you have trouble finding time to exercise, try to include exercise in your daily routine. ? Get up, stretch, and walk around every 30 minutes throughout the day. ? Go for a  walk during your lunch break. ? Park your car farther away from your destination. ? If you take public transportation, get off one stop early and walk the rest of the way. ? Make phone calls while standing up and walking  around. ? Take the stairs instead of elevators or escalators.  Wear comfortable clothes and shoes with good support.  Do not exercise so much that you hurt yourself, feel dizzy, or get very short of breath. Where to find more information  U.S. Department of Health and Human Services: [employer].at  Centers for Disease Control and Prevention (CDC): http://www.wolf.info/ Contact a health care provider:  Before starting a new exercise program.  If you have questions or concerns about your weight.  If you have a medical problem that keeps you from exercising. Get help right away if you have any of the following while exercising:  Injury.  Dizziness.  Difficulty breathing or shortness of breath that does not go away when you stop exercising.  Chest pain.  Rapid heartbeat. Summary  Being overweight increases your risk of heart disease, stroke, diabetes, high blood pressure, and several types of cancer.  Losing weight happens when you burn more calories than you eat.  Reducing the amount of calories you eat in addition to getting regular moderate or vigorous exercise each week helps you lose weight. This information is not intended to replace advice given to you by your health care provider. Make sure you discuss any questions you have with your health care provider. Document Released: 05/27/2010 Document Revised: 05/07/2017 Document Reviewed: 05/07/2017 Elsevier Interactive Patient Education  2019 [place] 18-39 Years, Male Preventive care refers to lifestyle choices and visits with your health care provider that can promote health and wellness. What does preventive care include?   A yearly physical exam. This is also called an annual well check.  Dental exams once or twice a year.  Routine eye exams. Ask your health care provider how often you should have your eyes checked.  Personal lifestyle choices, including: ? Daily care of your teeth and  gums. ? Regular physical activity. ? Eating a healthy diet. ? Avoiding tobacco and drug use. ? Limiting alcohol use. ? Practicing safe sex. What happens during an annual well check? The services and screenings done by your health care provider during your annual well check will depend on your age, overall health, lifestyle risk factors, and family history of disease. Counseling Your health care provider may ask you questions about your:  Alcohol use.  Tobacco use.  Drug use.  Emotional well-being.  Home and relationship well-being.  Sexual activity.  Eating habits.  Work and work Statistician. Screening You may have the following tests or measurements:  Height, weight, and BMI.  Blood pressure.  Lipid and cholesterol levels. These may be checked every 5 years starting at age 31.  Diabetes screening. This is done by checking your blood sugar (glucose) after you have not eaten for a while (fasting).  Skin check.  Hepatitis C blood test.  Hepatitis B blood test.  Sexually transmitted disease (STD) testing. Discuss your test results, treatment options, and if necessary, the need for more tests with your health care provider. Vaccines Your health care provider may recommend certain vaccines, such as:  Influenza vaccine. This is recommended every year.  Tetanus, diphtheria, and acellular pertussis (Tdap, Td) vaccine. You may need a Td booster every 10 years.  Varicella vaccine. You may need this if you have not been vaccinated.  HPV vaccine. If you are 57 or younger, you may need three doses over 6 months.  Measles, mumps, and rubella (MMR) vaccine. You may need at least one dose of MMR.You may also need a second dose.  Pneumococcal 13-valent conjugate (PCV13) vaccine. You may need this if you have certain conditions and have not been vaccinated.  Pneumococcal polysaccharide (PPSV23) vaccine. You may need one or two doses if you smoke cigarettes or if you have  certain conditions.  Meningococcal vaccine. One dose is recommended if you are age 6-21 years and a first-year college student living in a residence hall, or if you have one of several medical conditions. You may also need additional booster doses.  Hepatitis A vaccine. You may need this if you have certain conditions or if you travel or work in places where you may be exposed to hepatitis A.  Hepatitis B vaccine. You may need this if you have certain conditions or if you travel or work in places where you may be exposed to hepatitis B.  Haemophilus influenzae type b (Hib) vaccine. You may need this if you have certain risk factors. Talk to your health care provider about which screenings and vaccines you need and how often you need them. This information is not intended to replace advice given to you by your health care provider. Make sure you discuss any questions you have with your health care provider. Document Released: 06/20/2001 Document Revised: 12/05/2016 Document Reviewed: 02/23/2015 Elsevier Interactive Patient Education  2019 [employer].

## 2018-04-29 NOTE — Progress Notes (Signed)
Name: David Santos   MRN: [id number]    DOB: 1985-12-31   Date:04/29/2018       Progress Note  Subjective  Chief Complaint  Chief Complaint  Patient presents with  . Annual Exam    HPI  Patient presents for annual CPE.  [place] [facility] assists with the appointment.  Also needs FL2 completed which will be done today.  USPSTF grade A and B recommendations:  Diet: His diet is balanced - he eats 2 lunches - 1 lunch from group home and 1 lunch from his day program; eating breakfast and dinner; snacks on chips.  He is up 34lbs from May 2019.  Exercise: He does walk and sometimes run at his day program.  Depression:  [facility] 04/29/2018 04/23/2017 11/13/2016 07/25/2016 05/15/2016  Decreased Interest 0 0 0 0 0  Down, Depressed, Hopeless 0 0 0 0 0  PHQ - 2 Score 0 0 0 0 0  Altered sleeping 0 - - - -  Tired, decreased energy 0 - - - -  Change in appetite 0 - - - -  Feeling bad or failure about yourself  0 - - - -  Trouble concentrating 0 - - - -  Moving slowly or fidgety/restless 0 - - - -  Suicidal thoughts 0 - - - -  PHQ-9 Score 0 - - - -  Difficult doing work/chores Not difficult at all - - - -   Hypertension:  BP Readings from Last 3 Encounters:  04/29/18 122/80  04/02/18 138/68  09/14/17 130/80   Obesity: Wt Readings from Last 3 Encounters:  04/29/18 183 lb 14.4 oz (83.4 kg)  04/02/18 180 lb 9.6 oz (81.9 kg)  09/14/17 149 lb 1.6 oz (67.6 kg)   BMI Readings from Last 3 Encounters:  04/29/18 30.60 kg/m  04/02/18 30.05 kg/m  09/14/17 24.81 kg/m    Lipids:  Lab Results  Component Value Date   CHOL 160 02/13/2017   CHOL 177 02/07/2016   CHOL 199 12/16/2014   Lab Results  Component Value Date   HDL 48 02/13/2017   HDL 37 (L) 02/07/2016   HDL 33 (L) 12/16/2014   Lab Results  Component Value Date   LDLCALC 86 02/13/2017   LDLCALC 111 02/07/2016   LDLCALC 128 (H) 12/16/2014   Lab Results  Component Value Date   TRIG 158 (H)  02/13/2017   TRIG 147 02/07/2016   TRIG 190 (H) 12/16/2014   Lab Results  Component Value Date   CHOLHDL 3.3 02/13/2017   CHOLHDL 4.8 02/07/2016   CHOLHDL 6.0 (H) 12/16/2014   No results found for: LDLDIRECT Glucose:  Glucose  Date Value Ref Range Status  11/15/2012 133 (H) 65 - 99 mg/dL Final  11/91/478203/25/2014 92 65 - 99 mg/dL Final   Glucose, Bld  Date Value Ref Range Status  07/25/2016 104 (H) 65 - 99 mg/dL Final  95/62/130810/06/2015 73 65 - 99 mg/dL Final  65/78/469606/02/2016 295110 (H) 65 - 99 mg/dL Final      [facility]  AUDIT-C Score  0     Single STD testing and prevention (HIV/chl/gon/syphilis): Declines Hep C: Declines  Skin cancer: No concerning lesions Colorectal cancer: Denies family or personal history of colorectal cancer, no changes in BM's - no blood in stool, dark and tarry stool, mucus in stool, or constipation/diarrhea. Prostate cancer: No known family history. No results found for: PSA  Lung cancer:  Never  smoker; Low Dose CT Chest recommended if Age 30-80 years, 30 pack-year currently smoking OR have quit w/in 15years. Patient does not qualify.   AAA: Not indicated. The USPSTF recommends one-time screening with ultrasonography in men ages 58 to 74 years who have ever smoked ECG: Not indicated; no chest pain, shortness of breath, or palpitations.  Advanced Care Planning: He does have a legal guardian, lives in a group home.  [facility] will find out if living will and HCPOA to ensure this is updated.  Patient Active Problem List   Diagnosis Date Noted  . Catatonia 04/10/2016  . Schizophreniform disorder (HCC) 10/18/2015  . Autism spectrum disorder   . Dyslipidemia 06/28/2015  . Active autistic disorder 10/25/2014  . Benign essential hypertension 10/25/2014  . Chronic insomnia 10/25/2014  . History of acute renal failure 10/25/2014  . Dysmetabolic syndrome 10/25/2014  . Moderate intellectual disability 10/25/2014  .  Perennial allergic rhinitis 10/25/2014  . Obese 10/25/2014  . General psychoses (HCC) 10/25/2014  . Seborrhea capitis 10/25/2014  . Personal history of urethral stricture 10/25/2014  . Vitamin D deficiency 01/15/2009  . Testicular hypofunction 07/17/2008    Past Surgical History:  Procedure Laterality Date  . TONSILLECTOMY AND ADENOIDECTOMY      Family History  Problem Relation Age of Onset  . Hypertension Mother   . Cancer Mother   . Graves' disease Mother   . Mental illness Father     Social History   Socioeconomic History  . Marital status: Single    Spouse name: Not on file  . Number of children: 0  . Years of education: Not on file  . Highest education level: Not on file  Occupational History  . Not on file  Social Needs  . Financial resource strain: Not very hard  . Food insecurity:    Worry: Never true    Inability: Never true  . Transportation needs:    Medical: No    Non-medical: No  Tobacco Use  . Smoking status: Never Smoker  . Smokeless tobacco: Never Used  Substance and Sexual Activity  . Alcohol use: No    Alcohol/week: 0.0 standard drinks  . Drug use: No  . Sexual activity: Never  Lifestyle  . Physical activity:    Days per week: 0 days    Minutes per session: 0 min  . Stress: Patient refused  Relationships  . Social connections:    Talks on phone: Once a week    Gets together: Once a week    Attends religious service: Never    Active member of club or organization: No    Attends meetings of clubs or organizations: Never    Relationship status: Never married  . Intimate partner violence:    Fear of current or ex partner: No    Emotionally abused: No    Physically abused: No    Forced sexual activity: No  Other Topics Concern  . Not on file  Social History Narrative   Lives in a group home - [facility]      Current Outpatient Medications:  .  benzonatate (TESSALON) 100 MG capsule, Take 1 capsule (100 mg total) by mouth  2 (two) times daily as needed for cough., Disp: 30 capsule, Rfl: 0 .  carbamide peroxide (DEBROX) 6.5 % OTIC solution, Place 5 drops into both ears 2 (two) times daily., Disp: 15 mL, Rfl: 1 .  dextromethorphan-guaiFENesin (MUCINEX DM) 30-600 MG 12hr tablet, Take 1 tablet by mouth 2 (  two) times daily., Disp: 14 tablet, Rfl: 1 .  diphenhydrAMINE (BENADRYL) 50 MG capsule, Take 1 capsule (50 mg total) by mouth as needed., Disp: 30 capsule, Rfl: 2 .  divalproex (DEPAKOTE ER) 500 MG 24 hr tablet, Take 1,000 mg by mouth., Disp: , Rfl:  .  fluticasone (FLONASE) 50 MCG/ACT nasal spray, SPRAY 2 SPRAYS INTO BOTH NOSTRILS EVERY DAY, Disp: 16 g, Rfl: 2 .  haloperidol (HALDOL) 5 MG tablet, Take 5 mg by mouth every evening., Disp: , Rfl:  .  lisinopril (PRINIVIL,ZESTRIL) 20 MG tablet, TAKE ONE TABLET BY MOUTH EVERY DAY, Disp: 31 tablet, Rfl: 0 .  loratadine (CLARITIN) 10 MG tablet, TAKE ONE TABLET BY MOUTH EVERY DAY, Disp: 31 tablet, Rfl: 0 .  risperiDONE (RISPERDAL) 4 MG tablet, Take 1 tablet (4 mg total) by mouth at bedtime., Disp: 30 tablet, Rfl: 0  Allergies  Allergen Reactions  . Hydrochlorothiazide     Other reaction(s): Other (See Comments) Acute renal failure    ROS  Constitutional: Negative for fever or weight change.  Respiratory: Negative for cough and shortness of breath.   Cardiovascular: Negative for chest pain or palpitations.  Gastrointestinal: Negative for abdominal pain, no bowel changes.  Musculoskeletal: Negative for gait problem or joint swelling.  Skin: Negative for rash.  Neurological: Negative for dizziness or headache.  No other specific complaints in a complete review of systems (except as listed in HPI above).  Objective  Vitals:   04/29/18 1250  BP: 122/80  Pulse: 99  Temp: 98.1 F (36.7 C)  SpO2: 99%  Weight: 183 lb 14.4 oz (83.4 kg)  Height: 5\' 5"  (1.651 m)    Body mass index is 30.6 kg/m.  Physical Exam Constitutional: Patient appears well-developed and  well-nourished. No distress.  HENT: Head: Normocephalic and atraumatic. Ears: B TMs ok, no erythema or effusion; Nose: Nose normal. Mouth/Throat: Oropharynx is clear and moist. No oropharyngeal exudate.  Eyes: Conjunctivae and EOM are normal. Pupils are equal, round, and reactive to light. No scleral icterus.  Neck: Normal range of motion. Neck supple. No JVD present. No thyromegaly present.  Cardiovascular: Normal rate, regular rhythm and normal heart sounds.  No murmur heard. No BLE edema. Pulmonary/Chest: Effort normal and breath sounds normal. No respiratory distress. Abdominal: Soft. Bowel sounds are normal, no distension. There is no tenderness. no masses MALE GENITALIA: Normal descended testes bilaterally, no masses palpated, no hernias, no lesions, no discharge RECTAL: Deferred Musculoskeletal: Normal range of motion, no joint effusions. No gross deformities Neurological: he is alert and oriented to person, place, and time. No cranial nerve deficit. Coordination, balance, strength, speech and gait are normal.  Skin: Skin is warm and dry. No rash noted. No erythema.  Psychiatric: Patient has a normal mood and affect. behavior is normal. Judgment and thought content normal.  No results found for this or any previous visit (from the past 2160 hour(s)).   PHQ2/9: [facility] 04/29/2018 04/23/2017 11/13/2016 07/25/2016 05/15/2016  Decreased Interest 0 0 0 0 0  Down, Depressed, Hopeless 0 0 0 0 0  PHQ - 2 Score 0 0 0 0 0  Altered sleeping 0 - - - -  Tired, decreased energy 0 - - - -  Change in appetite 0 - - - -  Feeling bad or failure about yourself  0 - - - -  Trouble concentrating 0 - - - -  Moving slowly or fidgety/restless 0 - - - -  Suicidal thoughts 0 - - - -  PHQ-9 Score 0 - - - -  Difficult doing work/chores Not difficult at all - - - -   Fall Risk: Fall Risk  04/29/2018 04/02/2018 09/14/2017 05/16/2017 04/23/2017  Falls in the past year? 0 0 No Yes No  Number  falls in past yr: - 0 - 1 -  Injury with Fall? - - - No -  Follow up - - - Education provided -   Assessment & Plan  1. Annual physical exam -Prostate cancer screening and PSA options (with potential risks and benefits of testing vs not testing) were discussed along with recent recs/guidelines. -USPSTF grade A and B recommendations reviewed with patient; age-appropriate recommendations, preventive care, screening tests, etc discussed and encouraged; healthy living encouraged; see AVS for patient education given to patient -Discussed importance of 150 minutes of physical activity weekly, eat two servings of fish weekly, eat one serving of tree nuts ( cashews, pistachios, pecans, almonds.Marland Kitchen) every other day, eat 6 servings of fruit/vegetables daily and drink plenty of water and avoid sweet beverages.   2. Need for influenza vaccination - Flu Vaccine QUAD 6+ mos PF IM (Fluarix Quad PF)  3. Need for hepatitis C screening test - Hepatitis C antibody  4. Class 1 obesity due to excess calories without serious comorbidity with body mass index (BMI) of 30.0 to 30.9 in adult - Lipid panel - Hemoglobin A1c  5. Unintended weight gain - Lipid panel - Hemoglobin A1c - CBC w/Diff/Platelet - COMPLETE METABOLIC PANEL WITH GFR - TSH  6. Schizophreniform disorder (HCC) 7. Vitamin D deficiency - VITAMIN D 25 Hydroxy (Vit-D Deficiency, Fractures) 8. Moderate intellectual disability 9. Dysmetabolic syndrome - Hemoglobin A1c - CBC w/Diff/Platelet - COMPLETE METABOLIC PANEL WITH GFR  FL2 is completed at visit.

## 2018-04-30 LAB — COMPLETE METABOLIC PANEL WITH GFR
AG Ratio: 1.3 (calc) (ref 1.0–2.5)
ALT: 25 U/L (ref 9–46)
AST: 20 U/L (ref 10–40)
Albumin: 4.1 g/dL (ref 3.6–5.1)
Alkaline phosphatase (APISO): 55 U/L (ref 40–115)
BUN: 9 mg/dL (ref 7–25)
CO2: 25 mmol/L (ref 20–32)
Calcium: 9.5 mg/dL (ref 8.6–10.3)
Chloride: 104 mmol/L (ref 98–110)
Creat: 0.7 mg/dL (ref 0.60–1.35)
GFR, EST NON AFRICAN AMERICAN: 127 mL/min/{1.73_m2} (ref >=60)
GFR, Est African American: 147 mL/min/{1.73_m2} (ref >=60)
GLOBULIN: 3.1 g/dL (ref 1.9–3.7)
Glucose, Bld: 102 mg/dL — ABNORMAL HIGH (ref 65–99)
Potassium: 4 mmol/L (ref 3.5–5.3)
Sodium: 139 mmol/L (ref 135–146)
Total Bilirubin: 0.3 mg/dL (ref 0.2–1.2)
Total Protein: 7.2 g/dL (ref 6.1–8.1)

## 2018-04-30 LAB — HEMOGLOBIN A1C
Hgb A1c MFr Bld: 5.6 % of total Hgb (ref <5.7)
Mean Plasma Glucose: 114 (calc)
eAG (mmol/L): 6.3 (calc)

## 2018-04-30 LAB — CBC WITH DIFFERENTIAL/PLATELET
Absolute Monocytes: 931 cells/uL (ref 200–950)
Basophils Absolute: 28 cells/uL (ref 0–200)
Basophils Relative: 0.4 %
Eosinophils Absolute: 343 cells/uL (ref 15–500)
Eosinophils Relative: 4.9 %
HCT: 41 % (ref 38.5–50.0)
Hemoglobin: 14.2 g/dL (ref 13.2–17.1)
Lymphs Abs: 2527 cells/uL (ref 850–3900)
MCH: 30.1 pg (ref 27.0–33.0)
MCHC: 34.6 g/dL (ref 32.0–36.0)
MCV: 87 fL (ref 80.0–100.0)
MPV: 9.6 fL (ref 7.5–12.5)
Monocytes Relative: 13.3 %
Neutro Abs: 3171 cells/uL (ref 1500–7800)
Neutrophils Relative %: 45.3 %
Platelets: 257 10*3/uL (ref 140–400)
RBC: 4.71 10*6/uL (ref 4.20–5.80)
RDW: 13.4 % (ref 11.0–15.0)
Total Lymphocyte: 36.1 %
WBC: 7 10*3/uL (ref 3.8–10.8)

## 2018-04-30 LAB — LIPID PANEL
Cholesterol: 182 mg/dL (ref <200)
HDL: 34 mg/dL — ABNORMAL LOW (ref >40)
LDL Cholesterol (Calc): 107 mg/dL (calc) — ABNORMAL HIGH
Non-HDL Cholesterol (Calc): 148 mg/dL (calc) — ABNORMAL HIGH (ref <130)
Total CHOL/HDL Ratio: 5.4 (calc) — ABNORMAL HIGH (ref <5.0)
Triglycerides: 304 mg/dL — ABNORMAL HIGH (ref <150)

## 2018-04-30 LAB — TSH: TSH: 1.28 mIU/L (ref 0.40–4.50)

## 2018-04-30 LAB — HEPATITIS C ANTIBODY
Hepatitis C Ab: NONREACTIVE
SIGNAL TO CUT-OFF: 0.03 (ref <1.00)

## 2018-04-30 LAB — VITAMIN D 25 HYDROXY (VIT D DEFICIENCY, FRACTURES): Vit D, 25-Hydroxy: 17 ng/mL — ABNORMAL LOW (ref 30–100)

## 2018-05-02 ENCOUNTER — Other Ambulatory Visit: Payer: Self-pay | Admitting: Family Medicine

## 2018-05-02 DIAGNOSIS — I10 Essential (primary) hypertension: Secondary | ICD-10-CM

## 2018-05-02 DIAGNOSIS — J3089 Other allergic rhinitis: Secondary | ICD-10-CM

## 2018-05-03 ENCOUNTER — Telehealth: Payer: Self-pay | Admitting: Family Medicine

## 2018-05-03 DIAGNOSIS — E559 Vitamin D deficiency, unspecified: Secondary | ICD-10-CM

## 2018-05-03 MED ORDER — VITAMIN D (ERGOCALCIFEROL) 1.25 MG (50000 UNIT) PO CAPS: 50000.0000 [IU] | ORAL_CAPSULE | ORAL | 0 refills | Status: DC

## 2018-05-03 NOTE — Telephone Encounter (Signed)
Copied from CRM [id number]. Topic: Quick Communication - Rx Refill/Question >> May 03, 2018  3:30 PM Fanny BienIlderton, Jessica L wrote: Medication: pt mother called and stated that vitamin D was not called into pharmacy. Pt mother would like this sent in. Please advise

## 2018-05-03 NOTE — Telephone Encounter (Signed)
Contacted mom and she said I would have to call [facility] [phone]. [facility] Elease [facility]Patricia [facility] and she scheduled an appt for Feb.

## 2018-05-03 NOTE — Telephone Encounter (Signed)
Sent refill, but due for follow up HTN

## 2018-05-03 NOTE — Telephone Encounter (Signed)
Tee'd up Vitamin D prescription due to labs showing deficiency per Maurice SmallEmily Boyce and send in to pharmacy.

## 2018-05-03 NOTE — Telephone Encounter (Signed)
Refill request for Hypertension medication:  Lisinopril 20 mg  Claritin 10 mg  Last office visit pertaining to hypertension: 04/29/2018  BP Readings from Last 3 Encounters:  04/29/18 122/80  04/02/18 138/68  09/14/17 130/80     Lab Results  Component Value Date   CREATININE 0.70 04/29/2018   BUN 9 04/29/2018   NA 139 04/29/2018   K 4.0 04/29/2018   CL 104 04/29/2018   CO2 25 04/29/2018   Follow-ups on file. None indicated

## 2018-06-03 ENCOUNTER — Encounter: Payer: Self-pay | Admitting: Family Medicine

## 2018-06-03 ENCOUNTER — Ambulatory Visit (INDEPENDENT_AMBULATORY_CARE_PROVIDER_SITE_OTHER): Payer: Medicare Other | Admitting: Family Medicine

## 2018-06-03 VITALS — BP 130/86 | HR 110 | Temp 97.9°F | Resp 20 | Ht 64.0 in | Wt 183.1 lb

## 2018-06-03 DIAGNOSIS — F2081 Schizophreniform disorder: Secondary | ICD-10-CM | POA: Diagnosis not present

## 2018-06-03 DIAGNOSIS — E785 Hyperlipidemia, unspecified: Secondary | ICD-10-CM

## 2018-06-03 DIAGNOSIS — E66811 Other obesity due to excess calories: Secondary | ICD-10-CM

## 2018-06-03 DIAGNOSIS — F71 Moderate intellectual disabilities: Secondary | ICD-10-CM | POA: Diagnosis not present

## 2018-06-03 DIAGNOSIS — I10 Essential (primary) hypertension: Secondary | ICD-10-CM

## 2018-06-03 DIAGNOSIS — E8881 Metabolic syndrome: Secondary | ICD-10-CM

## 2018-06-03 DIAGNOSIS — Z6830 Body mass index (BMI) 30.0-30.9, adult: Secondary | ICD-10-CM

## 2018-06-03 DIAGNOSIS — F840 Autistic disorder: Secondary | ICD-10-CM

## 2018-06-03 DIAGNOSIS — E6609 Other obesity due to excess calories: Secondary | ICD-10-CM

## 2018-06-03 DIAGNOSIS — J3089 Other allergic rhinitis: Secondary | ICD-10-CM

## 2018-06-03 DIAGNOSIS — G4700 Insomnia, unspecified: Secondary | ICD-10-CM

## 2018-06-03 DIAGNOSIS — E559 Vitamin D deficiency, unspecified: Secondary | ICD-10-CM

## 2018-06-03 MED ORDER — VITAMIN D (ERGOCALCIFEROL) 1.25 MG (50000 UNIT) PO CAPS: 50000.0000 [IU] | ORAL_CAPSULE | ORAL | 5 refills | Status: DC

## 2018-06-03 MED ORDER — MONTELUKAST SODIUM 10 MG PO TABS: 10.0000 mg | ORAL_TABLET | Freq: Every day | ORAL | 5 refills | Status: DC

## 2018-06-03 NOTE — Progress Notes (Signed)
Name: David Santos   MRN: [id number]    DOB: [birth year]   Date:06/03/2018       Progress Note  Subjective  Chief Complaint  Chief Complaint  Patient presents with  . Nasal Congestion    Onset-Friday, congested  . Cough    Dry Cough    HPI  Hypertension Benign: he was doing well on Lisinopril , off HCTZ because it caused acute renal failure. He is compliant with medication, living in a group home now ( [facility]  ) He came in today with [place]. Marland Kitchen He is unable to self medicate. He denies chest pain or palpitation. BP is at goal, he has gained 34 lbs in less than one year, needs to resume a healthy diet  Perennial AR: he always has nasal congestion, unable to use nasal spray, post nasal drainage and intermittent dry cough, we will add singular and dc flonase since he cannot use nasal spray  Moderate MR: he is now at a group home , he was admitted for psychosis in June 2017 and has been in a group home since. He sees a psychiatrist, Dr. Toni Amend . Reviewed medications from group home, he is off Zyprexa ,  Klonopin and topamax, seems to be doing well on Risperdal, Benadryl , depakote. Caregiver states behavior has been under control   Metabolic Syndrome:He was  eating healthier since he has been placed in group home 2017. Lost a total of 45 lbs in the previous  year, but gained 34 lbs since last visit with me in May 2019  Lbs. He did not know that he was going from hospital to group home upon discharge, he was very depressed and did not eat - wanted to go home. He now has a good appetite, advised to change his diet so he stops gaining weight. We will send order for diabetic diet.   Schizophrenia, autism:caregiverstates he does not seem depressed, he laughs and seems to be in a good mood. He does not engage with other residents because of autism, he still paces and talks to himself - stable, he is compliant with caregivers, he has episode of anger outburst . He had to be  hospitalized because of aggression in the past.  He goes to day programs and enjoys doing that.   Dyslipidemia:reviewed labs done last year, and high triglycerides, low HDL, needs to resume a healthy diet and exercise more   Tachycardia: stable, last TSH was normal, weight is going up now, needs to resume diet   Patient Active Problem List   Diagnosis Date Noted  . Catatonia 04/10/2016  . Schizophreniform disorder (HCC) 10/18/2015  . Autism spectrum disorder   . Dyslipidemia 06/28/2015  . Active autistic disorder 10/25/2014  . Benign essential hypertension 10/25/2014  . Chronic insomnia 10/25/2014  . History of acute renal failure 10/25/2014  . Dysmetabolic syndrome 10/25/2014  . Moderate intellectual disability 10/25/2014  . Perennial allergic rhinitis 10/25/2014  . Obese 10/25/2014  . General psychoses (HCC) 10/25/2014  . Seborrhea capitis 10/25/2014  . Personal history of urethral stricture 10/25/2014  . Vitamin D deficiency 01/15/2009  . Testicular hypofunction 07/17/2008    Past Surgical History:  Procedure Laterality Date  . TONSILLECTOMY AND ADENOIDECTOMY      Family History  Problem Relation Age of Onset  . Hypertension Mother   . Cancer Mother   . Graves' disease Mother   . Mental illness Father     Social History   Socioeconomic History  .  Marital status: Single    Spouse name: Not on file  . Number of children: 0  . Years of education: Not on file  . Highest education level: Not on file  Occupational History  . Not on file  Social Needs  . Financial resource strain: Not very hard  . Food insecurity:    Worry: Never true    Inability: Never true  . Transportation needs:    Medical: No    Non-medical: No  Tobacco Use  . Smoking status: Never Smoker  . Smokeless tobacco: Never Used  Substance and Sexual Activity  . Alcohol use: No    Alcohol/week: 0.0 standard drinks  . Drug use: No  . Sexual activity: Never  Lifestyle  . Physical  activity:    Days per week: 0 days    Minutes per session: 0 min  . Stress: Patient refused  Relationships  . Social connections:    Talks on phone: Once a week    Gets together: Once a week    Attends religious service: Never    Active member of club or organization: No    Attends meetings of clubs or organizations: Never    Relationship status: Never married  . Intimate partner violence:    Fear of current or ex partner: No    Emotionally abused: No    Physically abused: No    Forced sexual activity: No  Other Topics Concern  . Not on file  Social History Narrative   Lives in a group home - [facility]      Current Outpatient Medications:  .  diphenhydrAMINE (BENADRYL) 50 MG capsule, Take 1 capsule (50 mg total) by mouth as needed., Disp: 30 capsule, Rfl: 2 .  divalproex (DEPAKOTE ER) 500 MG 24 hr tablet, Take 500 mg by mouth at bedtime. , Disp: , Rfl:  .  haloperidol (HALDOL) 10 MG tablet, Take 10 mg by mouth every evening. , Disp: , Rfl:  .  lisinopril (PRINIVIL,ZESTRIL) 20 MG tablet, TAKE ONE TABLET BY MOUTH EVERY DAY, Disp: 31 tablet, Rfl: 10 .  loratadine (CLARITIN) 10 MG tablet, TAKE ONE TABLET BY MOUTH EVERY DAY, Disp: 31 tablet, Rfl: 10 .  risperiDONE (RISPERDAL) 4 MG tablet, Take 1 tablet (4 mg total) by mouth at bedtime., Disp: 30 tablet, Rfl: 0 .  Vitamin D, Ergocalciferol, (DRISDOL) 1.25 MG (50000 UT) CAPS capsule, Take 1 capsule (50,000 Units total) by mouth every 7 (seven) days., Disp: 4 capsule, Rfl: 5 .  montelukast (SINGULAIR) 10 MG tablet, Take 1 tablet (10 mg total) by mouth at bedtime., Disp: 30 tablet, Rfl: 5  Allergies  Allergen Reactions  . Hydrochlorothiazide     Other reaction(s): Other (See Comments) Acute renal failure    I personally reviewed active problem list, medication list, allergies, family history, social history with the patient/caregiver today.   ROS  Constitutional: Negative for fever, positive for  weight change.   Respiratory: Negative for cough and shortness of breath.   Cardiovascular: Negative for chest pain or palpitations.  Gastrointestinal: Negative for abdominal pain, no bowel changes.  Musculoskeletal: Negative for gait problem or joint swelling.  Skin: positive for for rash scalp .  Neurological: Negative for dizziness or headache.  No other specific complaints in a complete review of systems (except as listed in HPI above).  Objective  Vitals:   06/03/18 1050  BP: 130/86  Pulse: (!) 110  Resp: 20  Temp: 97.9 F (36.6 C)  TempSrc: Oral  SpO2: 99%  Weight: 183 lb 1.6 oz (83.1 kg)  Height: 5\' 4"  (1.626 m)    Body mass index is 31.43 kg/m.  Physical Exam  Constitutional: Patient appears well-developed and well-nourished. Obese  No distress.  HEENT: head atraumatic, normocephalic, pupils equal and reactive to light, neck supple, throat within normal limits. Seborrhea on scalp  Cardiovascular: Normal rate, regular rhythm and normal heart sounds.  No murmur heard. No BLE edema. Pulmonary/Chest: Effort normal and breath sounds normal. No respiratory distress. Abdominal: Soft.  There is no tenderness. Psychiatric: Patient has a normal mood and affect. behavior is normal. Judgment and thought content normal.  Recent Results (from the past 2160 hour(s))  CBC w/Diff/Platelet     Status: None   Collection Time: 04/29/18  1:35 PM  Result Value Ref Range   WBC 7.0 3.8 - 10.8 Thousand/uL   RBC 4.71 4.20 - 5.80 Million/uL   Hemoglobin 14.2 13.2 - 17.1 g/dL   HCT 16.1 09.6 - 04.5 %   MCV 87.0 80.0 - 100.0 fL   MCH 30.1 27.0 - 33.0 pg   MCHC 34.6 32.0 - 36.0 g/dL   RDW 40.9 81.1 - 91.4 %   Platelets 257 140 - 400 Thousand/uL   MPV 9.6 7.5 - 12.5 fL   Neutro Abs 3,171 1,500 - 7,800 cells/uL   Lymphs Abs 2,527 850 - 3,900 cells/uL   Absolute Monocytes 931 200 - 950 cells/uL   Eosinophils Absolute 343 15 - 500 cells/uL   Basophils Absolute 28 0 - 200 cells/uL   Neutrophils Relative  % 45.3 %   Total Lymphocyte 36.1 %   Monocytes Relative 13.3 %   Eosinophils Relative 4.9 %   Basophils Relative 0.4 %  TSH     Status: None   Collection Time: 04/29/18  1:35 PM  Result Value Ref Range   TSH 1.28 0.40 - 4.50 mIU/L  Lipid panel     Status: Abnormal   Collection Time: 04/29/18  1:35 PM  Result Value Ref Range   Cholesterol 182 <200 mg/dL   HDL 34 (L) >78 mg/dL   Triglycerides 295 (H) <150 mg/dL    Comment: . If a non-fasting specimen was collected, consider repeat triglyceride testing on a fasting specimen if clinically indicated.  Perry Mount et al. J. of Clin. Lipidol. 2015;9:129-169. Marland Kitchen    LDL Cholesterol (Calc) 107 (H) mg/dL (calc)    Comment: Reference range: <100 . Desirable range <100 mg/dL for primary prevention;   <70 mg/dL for patients with CHD or diabetic patients  with > or = 2 CHD risk factors. Marland Kitchen LDL-C is now calculated using the Martin-Hopkins  calculation, which is a validated novel method providing  better accuracy than the Friedewald equation in the  estimation of LDL-C.  Horald Pollen et al. Lenox Ahr. 6213;086(57): 2061-2068  (http://education.[facility].com/faq/FAQ164)    Total CHOL/HDL Ratio 5.4 (H) <5.0 (calc)   Non-HDL Cholesterol (Calc) 148 (H) <130 mg/dL (calc)    Comment: For patients with diabetes plus 1 major ASCVD risk  factor, treating to a non-HDL-C goal of <100 mg/dL  (LDL-C of <84 mg/dL) is considered a therapeutic  option.   COMPLETE METABOLIC PANEL WITH GFR     Status: Abnormal   Collection Time: 04/29/18  1:35 PM  Result Value Ref Range   Glucose, Bld 102 (H) 65 - 99 mg/dL    Comment: .            Fasting reference interval . For someone without known diabetes, a  glucose value between 100 and 125 mg/dL is consistent with prediabetes and should be confirmed with a follow-up test. .    BUN 9 7 - 25 mg/dL   Creat 8.410.70 3.240.60 - 4.011.35 mg/dL   GFR, Est Non African American 127 > OR = 60 mL/min/1.9173m2   GFR, Est African  American 147 > OR = 60 mL/min/1.5773m2   BUN/Creatinine Ratio NOT APPLICABLE 6 - 22 (calc)   Sodium 139 135 - 146 mmol/L   Potassium 4.0 3.5 - 5.3 mmol/L   Chloride 104 98 - 110 mmol/L   CO2 25 20 - 32 mmol/L   Calcium 9.5 8.6 - 10.3 mg/dL   Total Protein 7.2 6.1 - 8.1 g/dL   Albumin 4.1 3.6 - 5.1 g/dL   Globulin 3.1 1.9 - 3.7 g/dL (calc)   AG Ratio 1.3 1.0 - 2.5 (calc)   Total Bilirubin 0.3 0.2 - 1.2 mg/dL   Alkaline phosphatase (APISO) 55 40 - 115 U/L   AST 20 10 - 40 U/L   ALT 25 9 - 46 U/L  Hemoglobin A1c     Status: None   Collection Time: 04/29/18  1:35 PM  Result Value Ref Range   Hgb A1c MFr Bld 5.6 <5.7 % of total Hgb    Comment: For the purpose of screening for the presence of diabetes: . <5.7%       Consistent with the absence of diabetes 5.7-6.4%    Consistent with increased risk for diabetes             (prediabetes) > or =6.5%  Consistent with diabetes . This assay result is consistent with a decreased risk of diabetes. . Currently, no consensus exists regarding use of hemoglobin A1c for diagnosis of diabetes in children. . According to American Diabetes Association (ADA) guidelines, hemoglobin A1c <7.0% represents optimal control in non-pregnant diabetic patients. Different metrics may apply to specific patient populations.  Standards of Medical Care in Diabetes(ADA). .    Mean Plasma Glucose 114 (calc)   eAG (mmol/L) 6.3 (calc)  VITAMIN D 25 Hydroxy (Vit-D Deficiency, Fractures)     Status: Abnormal   Collection Time: 04/29/18  1:35 PM  Result Value Ref Range   Vit D, 25-Hydroxy 17 (L) 30 - 100 ng/mL    Comment: Vitamin D Status         25-OH Vitamin D: . Deficiency:                    <20 ng/mL Insufficiency:             20 - 29 ng/mL Optimal:                 > or = 30 ng/mL . For 25-OH Vitamin D testing on patients on  D2-supplementation and patients for whom quantitation  of D2 and D3 fractions is required, the QuestAssureD(TM) 25-OH VIT D,  (D2,D3), LC/MS/MS is recommended: order  code [id number] (patients >141yrs). . For more information on this test, go to: http://education.quest[facility].com/faq/FAQ163 (This link is being provided for  informational/educational purposes only.)   Hepatitis C antibody     Status: None   Collection Time: 04/29/18  1:35 PM  Result Value Ref Range   Hepatitis C Ab NON-REACTIVE NON-REACTI   SIGNAL TO CUT-OFF 0.03 <1.00    Comment: . HCV antibody was non-reactive. There is no laboratory  evidence of HCV infection. . In most cases, no further action is required. However, if recent HCV exposure is  suspected, a test for HCV RNA (test code 1610935645) is suggested. . For additional information please refer to http://education.quest[facility].com/faq/FAQ22v1 (This link is being provided for informational/ educational purposes only.) .       PHQ2/9: [facility] 04/29/2018 04/23/2017 11/13/2016 07/25/2016 05/15/2016  Decreased Interest 0 0 0 0 0  Down, Depressed, Hopeless 0 0 0 0 0  PHQ - 2 Score 0 0 0 0 0  Altered sleeping 0 - - - -  Tired, decreased energy 0 - - - -  Change in appetite 0 - - - -  Feeling bad or failure about yourself  0 - - - -  Trouble concentrating 0 - - - -  Moving slowly or fidgety/restless 0 - - - -  Suicidal thoughts 0 - - - -  PHQ-9 Score 0 - - - -  Difficult doing work/chores Not difficult at all - - - -     Fall Risk: Fall Risk  04/29/2018 04/02/2018 09/14/2017 05/16/2017 04/23/2017  Falls in the past year? 0 0 No Yes No  Number falls in past yr: - 0 - 1 -  Injury with Fall? - - - No -  Follow up - - - Education provided -      Assessment & Plan  1. Benign essential hypertension  At goal, continue medication   2. Dyslipidemia  I will send order for diabetic diet   3. Schizophreniform disorder (HCC)   4. Moderate intellectual disability  Stable, lives in a group home   5. Dysmetabolic syndrome  Gained 34 lbs since May 2019   6.  Autism spectrum disorder   7. Vitamin D deficiency  - Vitamin D, Ergocalciferol, (DRISDOL) 1.25 MG (50000 UT) CAPS capsule; Take 1 capsule (50,000 Units total) by mouth every 7 (seven) days.  Dispense: 4 capsule; Refill: 5  8. Class 1 obesity due to excess calories without serious comorbidity with body mass index (BMI) of 30.0 to 30.9 in adult  Needs to lose weight again  9. Perennial allergic rhinitis  - montelukast (SINGULAIR) 10 MG tablet; Take 1 tablet (10 mg total) by mouth at bedtime.  Dispense: 30 tablet; Refill: 5.

## 2018-06-11 ENCOUNTER — Ambulatory Visit: Payer: Self-pay | Admitting: Family Medicine

## 2018-06-17 DIAGNOSIS — Z79899 Other long term (current) drug therapy: Secondary | ICD-10-CM | POA: Diagnosis not present

## 2018-07-17 ENCOUNTER — Other Ambulatory Visit: Payer: Self-pay | Admitting: Family Medicine

## 2018-07-17 ENCOUNTER — Other Ambulatory Visit: Payer: Self-pay

## 2018-07-17 DIAGNOSIS — E559 Vitamin D deficiency, unspecified: Secondary | ICD-10-CM

## 2018-07-17 NOTE — Patient Outreach (Signed)
[facility]) Care Management  07/17/2018  David Santos 1985/05/24 [id number]   Medication Adherence call to David Santos patient did not answer [facility] said they pill pack every week, patient is due on Lisinopril 20 mg. David Santos is showing past due under [facility].   Lillia Abed CPhT Pharmacy Technician [facility] Care Management Direct Dial [phone]  Fax [phone] [email or web address]

## 2018-08-19 ENCOUNTER — Other Ambulatory Visit: Payer: Self-pay | Admitting: Family Medicine

## 2018-08-19 DIAGNOSIS — E559 Vitamin D deficiency, unspecified: Secondary | ICD-10-CM

## 2018-09-02 ENCOUNTER — Ambulatory Visit (INDEPENDENT_AMBULATORY_CARE_PROVIDER_SITE_OTHER): Payer: Medicare Other | Admitting: Family Medicine

## 2018-09-02 ENCOUNTER — Encounter: Payer: Self-pay | Admitting: Family Medicine

## 2018-09-02 ENCOUNTER — Other Ambulatory Visit: Payer: Self-pay

## 2018-09-02 VITALS — BP 122/104 | HR 83 | Wt 147.0 lb

## 2018-09-02 DIAGNOSIS — F840 Autistic disorder: Secondary | ICD-10-CM | POA: Diagnosis not present

## 2018-09-02 DIAGNOSIS — J3089 Other allergic rhinitis: Secondary | ICD-10-CM

## 2018-09-02 DIAGNOSIS — E8881 Metabolic syndrome: Secondary | ICD-10-CM

## 2018-09-02 DIAGNOSIS — F71 Moderate intellectual disabilities: Secondary | ICD-10-CM

## 2018-09-02 DIAGNOSIS — E785 Hyperlipidemia, unspecified: Secondary | ICD-10-CM | POA: Diagnosis not present

## 2018-09-02 DIAGNOSIS — I10 Essential (primary) hypertension: Secondary | ICD-10-CM | POA: Diagnosis not present

## 2018-09-02 DIAGNOSIS — F2081 Schizophreniform disorder: Secondary | ICD-10-CM | POA: Diagnosis not present

## 2018-09-02 NOTE — Progress Notes (Signed)
Name: David Santos   MRN: [id number]    DOB: 12/08/1985   Date:09/02/2018       Progress Note  Subjective  Chief Complaint  Chief Complaint  Patient presents with  . Weight Check    I connected with  David Santos  on 09/02/18 at 10:40 AM EDT by a video enabled telemedicine application and verified that I am speaking with the correct person using two identifiers.  I discussed the limitations of evaluation and management by telemedicine and the availability of in person appointments. The patient expressed understanding and agreed to proceed. Staff also discussed with the patient that there may be a patient responsible charge related to this service. Patient Location: at home  [facility] Additional Individuals present: Annice PihJackie - caregiver   HPI  Hypertension Benign:he wasdoing well on Lisinopril , off HCTZ because it caused acute renal failure. He is compliant with medication, living in a group home now ( [facility]) He was at home with caregiver - Annice PihJackie . He is unable to self medicate. He denies chest pain or palpitation. BP is at goal, he had  gained 34 lbs prior to last visit, however caregiver changed his diet as recommended and he lost 36 lbs - back to his baseline weight.   Perennial AR: , we added singulair and dry cough improved since last visit , also no rhinorrhea and nasal congestion has also improved   Moderate MR: he is now at a group home , he was admitted for psychosis in June 2017 and has been in a group home since. He sees a psychiatrist, Dr. Toni Amendlapacs . Reviewed medications from group home, he is off Zyprexa , Klonopin and topamax, seems to be doing well on Risperdal,Benadryl , depakote. Caregiver states behavior has been under control . Unchanged   Metabolic Syndrome:He was  eating healthier since he has been placed in group home 2017. Lost a total of 45 lbs in the previousyear, but gained 34 lbs since last visit  with me in May 2019  Lbs, but since January 2020 he is down 36 lbs with life style modification . Doing great, walking daily around his home  Schizophrenia, autism:caregiverstates he does not seem depressed, he laughs and seems to be in a good mood. He does not engage with other residents because of autism,he still paces and talks to himself - stable, he is compliant with caregivers, with only occasional  episodes of anger outburst . He had to be hospitalized because of aggression in the past.  He has not been going to the day program since COVID-19    Dyslipidemia:reviewed labs done last year, and high triglycerides, low HDL, he has resumed a healthier diet and has lost weight since last visit, continue the hard work . Recheck next visit when he can come in to our office   Tachycardia:normal pulse today, sitting at his home.    Patient Active Problem List   Diagnosis Date Noted  . Catatonia 04/10/2016  . Schizophreniform disorder (HCC) 10/18/2015  . Autism spectrum disorder   . Dyslipidemia 06/28/2015  . Active autistic disorder 10/25/2014  . Benign essential hypertension 10/25/2014  . Chronic insomnia 10/25/2014  . History of acute renal failure 10/25/2014  . Dysmetabolic syndrome 10/25/2014  . Moderate intellectual disability 10/25/2014  . Perennial allergic rhinitis 10/25/2014  . Obese 10/25/2014  . General psychoses (HCC) 10/25/2014  . Seborrhea capitis 10/25/2014  . Personal history of urethral stricture 10/25/2014  .  Vitamin D deficiency 01/15/2009  . Testicular hypofunction 07/17/2008    Past Surgical History:  Procedure Laterality Date  . TONSILLECTOMY AND ADENOIDECTOMY      Family History  Problem Relation Age of Onset  . Hypertension Mother   . Cancer Mother   . Graves' disease Mother   . Mental illness Father     Social History   Socioeconomic History  . Marital status: Single    Spouse name: Not on file  . Number of children: 0  . Years of  education: Not on file  . Highest education level: Not on file  Occupational History  . Not on file  Social Needs  . Financial resource strain: Not very hard  . Food insecurity:    Worry: Never true    Inability: Never true  . Transportation needs:    Medical: No    Non-medical: No  Tobacco Use  . Smoking status: Never Smoker  . Smokeless tobacco: Never Used  Substance and Sexual Activity  . Alcohol use: No    Alcohol/week: 0.0 standard drinks  . Drug use: No  . Sexual activity: Never  Lifestyle  . Physical activity:    Days per week: 0 days    Minutes per session: 0 min  . Stress: Patient refused  Relationships  . Social connections:    Talks on phone: Once a week    Gets together: Once a week    Attends religious service: Never    Active member of club or organization: No    Attends meetings of clubs or organizations: Never    Relationship status: Never married  . Intimate partner violence:    Fear of current or ex partner: No    Emotionally abused: No    Physically abused: No    Forced sexual activity: No  Other Topics Concern  . Not on file  Social History Narrative   Lives in a group home - [facility]      Current Outpatient Medications:  .  diphenhydrAMINE (BENADRYL) 50 MG capsule, Take 1 capsule (50 mg total) by mouth as needed., Disp: 30 capsule, Rfl: 2 .  divalproex (DEPAKOTE ER) 500 MG 24 hr tablet, Take 500 mg by mouth at bedtime. , Disp: , Rfl:  .  haloperidol (HALDOL) 10 MG tablet, Take 10 mg by mouth every evening. , Disp: , Rfl:  .  lisinopril (PRINIVIL,ZESTRIL) 20 MG tablet, TAKE ONE TABLET BY MOUTH EVERY DAY, Disp: 31 tablet, Rfl: 10 .  loratadine (CLARITIN) 10 MG tablet, TAKE ONE TABLET BY MOUTH EVERY DAY, Disp: 31 tablet, Rfl: 10 .  montelukast (SINGULAIR) 10 MG tablet, Take 1 tablet (10 mg total) by mouth at bedtime., Disp: 30 tablet, Rfl: 5 .  risperiDONE (RISPERDAL) 4 MG tablet, Take 1 tablet (4 mg total) by mouth at bedtime., Disp:  30 tablet, Rfl: 0 .  Vitamin D, Ergocalciferol, (DRISDOL) 1.25 MG (50000 UT) CAPS capsule, TAKE ONE CAPSULE BY MOUTH EVERY 7 DAYS, Disp: 4 capsule, Rfl: 0  Allergies  Allergen Reactions  . Hydrochlorothiazide     Other reaction(s): Other (See Comments) Acute renal failure    I personally reviewed active problem list, medication list, allergies, family history, social history with the patient/caregiver today.   ROS  Ten systems reviewed and is negative except as mentioned in HPI   Objective  Virtual encounter, vitals done at home, caregiver will keep a check of his bp and leg me know if he needs to return  for follow up sooner    Physical Exam  Awake, alert and cooperative  PHQ2/9: Depression screen [facility] 2/9 09/02/2018 04/29/2018 04/23/2017 11/13/2016 07/25/2016  Decreased Interest 0 0 0 0 0  Down, Depressed, Hopeless 0 0 0 0 0  PHQ - 2 Score 0 0 0 0 0  Altered sleeping 0 0 - - -  Tired, decreased energy 0 0 - - -  Change in appetite 0 0 - - -  Feeling bad or failure about yourself  0 0 - - -  Trouble concentrating 0 0 - - -  Moving slowly or fidgety/restless 0 0 - - -  Suicidal thoughts 0 0 - - -  PHQ-9 Score 0 0 - - -  Difficult doing work/chores Not difficult at all Not difficult at all - - -   PHQ-2/9 Result is negative.    Fall Risk: Fall Risk  04/29/2018 04/02/2018 09/14/2017 05/16/2017 04/23/2017  Falls in the past year? 0 0 No Yes No  Number falls in past yr: - 0 - 1 -  Injury with Fall? - - - No -  Follow up - - - Education provided -    Assessment & Plan   1. Benign essential hypertension  bp up today  2. Dyslipidemia  Recheck on his next visit   3. Autism spectrum disorder  Stable  4. Schizophreniform disorder (HCC)  Stable   5. Moderate intellectual disability   6. Dysmetabolic syndrome  On life style modification, recheck labs on his next visit   7. Perennial allergic rhinitis  Doing well on singulair   I discussed the assessment and  treatment plan with the patient. The patient was provided an opportunity to ask questions and all were answered. The patient agreed with the plan and demonstrated an understanding of the instructions.  The patient was advised to call back or seek an in-person evaluation if the symptoms worsen or if the condition fails to improve as anticipated.  I provided 25 minutes of non-face-to-face time during this encounter.

## 2018-12-04 DIAGNOSIS — Z79899 Other long term (current) drug therapy: Secondary | ICD-10-CM | POA: Diagnosis not present

## 2018-12-10 ENCOUNTER — Ambulatory Visit: Payer: Self-pay | Admitting: Family Medicine

## 2019-02-03 DIAGNOSIS — H04123 Dry eye syndrome of bilateral lacrimal glands: Secondary | ICD-10-CM | POA: Diagnosis not present

## 2019-02-03 DIAGNOSIS — H5213 Myopia, bilateral: Secondary | ICD-10-CM | POA: Diagnosis not present

## 2019-02-06 DIAGNOSIS — Z041 Encounter for examination and observation following transport accident: Secondary | ICD-10-CM | POA: Diagnosis not present

## 2019-02-21 DIAGNOSIS — H5213 Myopia, bilateral: Secondary | ICD-10-CM | POA: Diagnosis not present

## 2019-02-21 DIAGNOSIS — H52223 Regular astigmatism, bilateral: Secondary | ICD-10-CM | POA: Diagnosis not present

## 2019-03-13 ENCOUNTER — Telehealth: Payer: Self-pay | Admitting: Family Medicine

## 2019-03-13 NOTE — Chronic Care Management (AMB) (Signed)
Chronic Care Management   Note  03/13/2019 Name: David Santos MRN: [id number] DOB: 03-12-1986  David Santos is a 33 y.o. year old male who is a primary care patient of Steele Sizer, MD. I reached out to Lanetta Inch by phone today in response to a referral sent by David Santos's health plan.     David Santos was given information about Chronic Care Management services today including:  1. CCM service includes personalized support from designated clinical staff supervised by his physician, including individualized plan of care and coordination with other care providers 2. 24/7 contact phone numbers for assistance for urgent and routine care needs. 3. Service will only be billed when office clinical staff spend 20 minutes or more in a month to coordinate care. 4. Only one practitioner may furnish and bill the service in a calendar month. 5. The patient may stop CCM services at any time (effective at the end of the month) by phone call to the office staff. 6. The patient will be responsible for cost sharing (co-pay) of up to 20% of the service fee (after annual deductible is met).  Patient did not agree to enrollment in care management services ( Patient in group Home) and does not wish to consider at this time.  Follow up plan: The patient has been provided with contact information for the chronic care management team and has been advised to call with any health related questions or concerns.   [place]  [email or web address]   ??[phone]

## 2019-05-01 ENCOUNTER — Encounter: Payer: Self-pay | Admitting: Family Medicine

## 2019-05-01 ENCOUNTER — Ambulatory Visit (INDEPENDENT_AMBULATORY_CARE_PROVIDER_SITE_OTHER): Payer: Medicare Other | Admitting: Family Medicine

## 2019-05-01 VITALS — BP 158/111 | HR 91 | Temp 98.3°F | Wt 140.0 lb

## 2019-05-01 DIAGNOSIS — I10 Essential (primary) hypertension: Secondary | ICD-10-CM

## 2019-05-01 DIAGNOSIS — E440 Moderate protein-calorie malnutrition: Secondary | ICD-10-CM

## 2019-05-01 DIAGNOSIS — F2081 Schizophreniform disorder: Secondary | ICD-10-CM

## 2019-05-01 DIAGNOSIS — E8881 Metabolic syndrome: Secondary | ICD-10-CM

## 2019-05-01 DIAGNOSIS — E785 Hyperlipidemia, unspecified: Secondary | ICD-10-CM | POA: Diagnosis not present

## 2019-05-01 DIAGNOSIS — F840 Autistic disorder: Secondary | ICD-10-CM

## 2019-05-01 DIAGNOSIS — E559 Vitamin D deficiency, unspecified: Secondary | ICD-10-CM

## 2019-05-01 DIAGNOSIS — J3089 Other allergic rhinitis: Secondary | ICD-10-CM

## 2019-05-01 DIAGNOSIS — R7309 Other abnormal glucose: Secondary | ICD-10-CM

## 2019-05-01 DIAGNOSIS — R739 Hyperglycemia, unspecified: Secondary | ICD-10-CM

## 2019-05-01 MED ORDER — LISINOPRIL 20 MG PO TABS: 20.0000 mg | ORAL_TABLET | Freq: Every day | ORAL | 0 refills | Status: DC

## 2019-05-01 MED ORDER — VITAMIN D (ERGOCALCIFEROL) 1.25 MG (50000 UNIT) PO CAPS: 50000.0000 [IU] | ORAL_CAPSULE | ORAL | 5 refills | Status: DC

## 2019-05-01 MED ORDER — LORATADINE 10 MG PO TABS: 10.0000 mg | ORAL_TABLET | Freq: Every day | ORAL | 10 refills | Status: DC

## 2019-05-01 MED ORDER — MONTELUKAST SODIUM 10 MG PO TABS: 10.0000 mg | ORAL_TABLET | Freq: Every day | ORAL | 5 refills | Status: DC

## 2019-05-01 NOTE — Progress Notes (Signed)
Name: David SimmeringDashawn K Ivancic   MRN: [id number]    DOB: 1986/03/08   Date:05/01/2019       Progress Note  Subjective  Chief Complaint  Chief Complaint  Patient presents with   Form Completion    FL2    I connected with  David Santos on 05/01/19 at  9:40 AM EST by telephone and verified that I am speaking with the correct person using two identifiers.  I discussed the limitations, risks, security and privacy concerns of performing an evaluation and management service by telephone and the availability of in person appointments. Staff also discussed with the patient that there may be a patient responsible charge related to this service. Patient Location: at home  [facility] Additional Individuals present: caregiver Darrell JewelJacqueline McCrimmon and his mother    HPI  Hypertension Benign:he wasdoing well on Lisinopril , off HCTZ because it caused acute renal failure. He lives in a group home  David Santos(  David Santos's )He was at home with caregiver - David Santos and his mother also . He is unable to self medicate. He denies chest pain or palpitation. BPis at goal, he had  gained 34 lbs in 2019, changed diet and weight went down 36 lbs, but since Feb he has lost another 43 lbs since January 2020.   Perennial AR: he has been taking singular and loratadine daily and cough, nasal congestion have improved. No longer coughing   Moderate MR: he is now at a group home , he was admitted for psychosis in June 2017 and has been in a group home since. He sees a psychiatrist, Dr. Toni Amendlapacs. Reviewed medications from group home, he is off Zyprexa , Klonopin and topamax, seems to be doing well on Risperdal,Benadryl , depakote.Caregiver states behavior has been good, however they don't force him to eat because he gets upset   Metabolic Syndrome:Hewaseating healthier since he has been placed in group home 2017. Lost a total of 45 lbs in the previousyear, but gained34 lbs  since last visit with me in May 2019lbs, and is now down 43 lbs and we will recheck labs   Schizophrenia, autism:caregiverstates he does not seem depressed even though he is not going to day camp because of COVID-19. He asks caregivers to take him out for a ride.Marland Kitchen. He does not engage with other residents because of autism,he still paces and talks to himself - stable, he is compliant with caregivers, with only occasional  episodes of anger outburstHe is taking medications   Dyslipidemia:we will recheck it next week.    Patient Active Problem List   Diagnosis Date Noted   Catatonia 04/10/2016   Schizophreniform disorder (HCC) 10/18/2015   Autism spectrum disorder    Dyslipidemia 06/28/2015   Active autistic disorder 10/25/2014   Benign essential hypertension 10/25/2014   Chronic insomnia 10/25/2014   History of acute renal failure 10/25/2014   Dysmetabolic syndrome 10/25/2014   Moderate intellectual disability 10/25/2014   Perennial allergic rhinitis 10/25/2014   Obese 10/25/2014   General psychoses (HCC) 10/25/2014   Seborrhea capitis 10/25/2014   Personal history of urethral stricture 10/25/2014   Vitamin D deficiency 01/15/2009   Testicular hypofunction 07/17/2008    Past Surgical History:  Procedure Laterality Date   TONSILLECTOMY AND ADENOIDECTOMY      Family History  Problem Relation Age of Onset   Hypertension Mother    Cancer Mother    Luiz BlareGraves' disease Mother    Mental illness Father  Current Outpatient Medications:    diphenhydrAMINE (BENADRYL) 50 MG capsule, Take 1 capsule (50 mg total) by mouth as needed., Disp: 30 capsule, Rfl: 2   divalproex (DEPAKOTE ER) 500 MG 24 hr tablet, Take 500 mg by mouth at bedtime. , Disp: , Rfl:    haloperidol (HALDOL) 10 MG tablet, Take 10 mg by mouth every evening. , Disp: , Rfl:    lisinopril (PRINIVIL,ZESTRIL) 20 MG tablet, TAKE ONE TABLET BY MOUTH EVERY DAY, Disp: 31 tablet, Rfl: 10    loratadine (CLARITIN) 10 MG tablet, TAKE ONE TABLET BY MOUTH EVERY DAY, Disp: 31 tablet, Rfl: 10   montelukast (SINGULAIR) 10 MG tablet, Take 1 tablet (10 mg total) by mouth at bedtime., Disp: 30 tablet, Rfl: 5   risperiDONE (RISPERDAL) 3 MG tablet, Take 3 mg by mouth 2 (two) times daily., Disp: , Rfl:    Vitamin D, Ergocalciferol, (DRISDOL) 1.25 MG (50000 UT) CAPS capsule, TAKE ONE CAPSULE BY MOUTH EVERY 7 DAYS, Disp: 4 capsule, Rfl: 0  Allergies  Allergen Reactions   Hydrochlorothiazide     Other reaction(s): Other (See Comments) Acute renal failure    I personally reviewed active problem list, medication list, allergies, family history, social history, health maintenance with the patient/caregiver today.   ROS  Ten systems reviewed and is negative except as mentioned in HPI  Objective  Virtual encounter, vitals obtained at group home today  Today's Vitals   05/01/19 0949  BP: (!) 154/109  Pulse: 100  Temp: 98.3 F (36.8 C)  TempSrc: Temporal  Weight: 140 lb (63.5 kg)   Body mass index is 24.03 kg/m.  Physical Exam  Awake, alert and cooperative   PHQ2/9: Depression screen [facility] 2/9 05/01/2019 09/02/2018 04/29/2018 04/23/2017 11/13/2016  Decreased Interest 0 0 0 0 0  Down, Depressed, Hopeless 0 0 0 0 0  PHQ - 2 Score 0 0 0 0 0  Altered sleeping 0 0 0 - -  Tired, decreased energy 0 0 0 - -  Change in appetite 0 0 0 - -  Feeling bad or failure about yourself  0 0 0 - -  Trouble concentrating 0 0 0 - -  Moving slowly or fidgety/restless 0 0 0 - -  Suicidal thoughts 0 0 0 - -  PHQ-9 Score 0 0 0 - -  Difficult doing work/chores - Not difficult at all Not difficult at all - -   PHQ-2/9 Result is negative, but patient is a poor historian and questions answering by caregiver and mother .    Fall Risk: Fall Risk  05/01/2019 04/29/2018 04/02/2018 09/14/2017 05/16/2017  Falls in the past year? 0 0 0 No Yes  Number falls in past yr: 0 - 0 - 1  Injury with Fall? 0 - - - No   Follow up - - - - Education provided     Assessment & Plan  1. Moderate protein-calorie malnutrition (HCC)  - CBC with Differential/Platelet - COMPLETE METABOLIC PANEL WITH GFR - Sedimentation rate - C-reactive protein - HIV Antibody (routine testing w rflx)  2. Uncontrolled hypertension  - TSH Return next week for labs and bp check, if bp still above 140/90 we will add Norvasc 2.5 mg , he cannot take HCTZ because it caused acute renal failure in the past  3. Dyslipidemia  - Lipid panel  4. Schizophreniform disorder (HCC)   5. Autism spectrum disorder   6. Vitamin D deficiency  - Vitamin D, Ergocalciferol, (DRISDOL) 1.25 MG (50000 UT) CAPS capsule;  Take 1 capsule (50,000 Units total) by mouth every 7 (seven) days.  Dispense: 4 capsule; Refill: 5  7. Dysmetabolic syndrome  - Hemoglobin A1c   8. Hyperglycemia  - Hemoglobin A1c  9. Perennial allergic rhinitis  - montelukast (SINGULAIR) 10 MG tablet; Take 1 tablet (10 mg total) by mouth at bedtime.  Dispense: 30 tablet; Refill: 5 - loratadine (CLARITIN) 10 MG tablet; Take 1 tablet (10 mg total) by mouth daily.  Dispense: 31 tablet; Refill: 10  10. Benign essential hypertension  - lisinopril (ZESTRIL) 20 MG tablet; Take 1 tablet (20 mg total) by mouth daily.  Dispense: 31 tablet; Refill: 0  I discussed the assessment and treatment plan with the patient. The patient was provided an opportunity to ask questions and all were answered. The patient agreed with the plan and demonstrated an understanding of the instructions.   The patient was advised to call back or seek an in-person evaluation if the symptoms worsen or if the condition fails to improve as anticipated.  I provided 25  minutes of non-face-to-face time during this encounter.  Loistine Chance, MD

## 2019-05-08 ENCOUNTER — Ambulatory Visit: Payer: Medicare Other

## 2019-05-13 ENCOUNTER — Other Ambulatory Visit: Payer: Self-pay | Admitting: Family Medicine

## 2019-05-13 DIAGNOSIS — D649 Anemia, unspecified: Secondary | ICD-10-CM

## 2019-05-15 LAB — CBC WITH DIFFERENTIAL/PLATELET
Absolute Monocytes: 775 cells/uL (ref 200–950)
Basophils Absolute: 31 cells/uL (ref 0–200)
Basophils Relative: 0.6 %
Eosinophils Absolute: 140 cells/uL (ref 15–500)
Eosinophils Relative: 2.7 %
HCT: 38.7 % (ref 38.5–50.0)
Hemoglobin: 13.1 g/dL — ABNORMAL LOW (ref 13.2–17.1)
Lymphs Abs: 1732 cells/uL (ref 850–3900)
MCH: 32.7 pg (ref 27.0–33.0)
MCHC: 33.9 g/dL (ref 32.0–36.0)
MCV: 96.5 fL (ref 80.0–100.0)
MPV: 9.9 fL (ref 7.5–12.5)
Monocytes Relative: 14.9 %
Neutro Abs: 2522 cells/uL (ref 1500–7800)
Neutrophils Relative %: 48.5 %
Platelets: 199 10*3/uL (ref 140–400)
RBC: 4.01 10*6/uL — ABNORMAL LOW (ref 4.20–5.80)
RDW: 14.2 % (ref 11.0–15.0)
Total Lymphocyte: 33.3 %
WBC: 5.2 10*3/uL (ref 3.8–10.8)

## 2019-05-15 LAB — COMPLETE METABOLIC PANEL WITH GFR
AG Ratio: 1.6 (calc) (ref 1.0–2.5)
ALT: 11 U/L (ref 9–46)
AST: 14 U/L (ref 10–40)
Albumin: 4.1 g/dL (ref 3.6–5.1)
Alkaline phosphatase (APISO): 45 U/L (ref 36–130)
BUN: 7 mg/dL (ref 7–25)
CO2: 31 mmol/L (ref 20–32)
Calcium: 9.1 mg/dL (ref 8.6–10.3)
Chloride: 104 mmol/L (ref 98–110)
Creat: 0.78 mg/dL (ref 0.60–1.35)
GFR, Est African American: 139 mL/min/{1.73_m2} (ref >=60)
GFR, Est Non African American: 120 mL/min/{1.73_m2} (ref >=60)
Globulin: 2.5 g/dL (calc) (ref 1.9–3.7)
Glucose, Bld: 107 mg/dL — ABNORMAL HIGH (ref 65–99)
Potassium: 3.7 mmol/L (ref 3.5–5.3)
Sodium: 142 mmol/L (ref 135–146)
Total Bilirubin: 0.3 mg/dL (ref 0.2–1.2)
Total Protein: 6.6 g/dL (ref 6.1–8.1)

## 2019-05-15 LAB — TEST AUTHORIZATION

## 2019-05-15 LAB — HEMOGLOBIN A1C
Hgb A1c MFr Bld: 5 % of total Hgb (ref <5.7)
Mean Plasma Glucose: 97 (calc)
eAG (mmol/L): 5.4 (calc)

## 2019-05-15 LAB — IRON,TIBC AND FERRITIN PANEL
%SAT: 36 % (calc) (ref 20–48)
Ferritin: 383 ng/mL — ABNORMAL HIGH (ref 38–380)
Iron: 118 ug/dL (ref 50–180)
TIBC: 326 mcg/dL (calc) (ref 250–425)

## 2019-05-15 LAB — LIPID PANEL
Cholesterol: 156 mg/dL (ref <200)
HDL: 52 mg/dL (ref >=40)
LDL Cholesterol (Calc): 79 mg/dL (calc)
Non-HDL Cholesterol (Calc): 104 mg/dL (calc) (ref <130)
Total CHOL/HDL Ratio: 3 (calc) (ref <5.0)
Triglycerides: 156 mg/dL — ABNORMAL HIGH (ref <150)

## 2019-05-15 LAB — HIV ANTIBODY (ROUTINE TESTING W REFLEX): HIV 1&2 Ab, 4th Generation: NONREACTIVE

## 2019-05-15 LAB — TSH: TSH: 1.87 mIU/L (ref 0.40–4.50)

## 2019-05-15 LAB — SEDIMENTATION RATE: Sed Rate: 2 mm/h (ref 0–15)

## 2019-05-15 LAB — C-REACTIVE PROTEIN: CRP: 0.4 mg/L (ref <8.0)

## 2019-05-22 ENCOUNTER — Telehealth: Payer: Self-pay

## 2019-05-22 NOTE — Telephone Encounter (Signed)
Copied from CRM [id number]. Topic: General - Inquiry >> May 21, 2019  4:25 PM Lynne Logan D wrote: Reason for CRM: Pt's group home stated pt's mother received a call regarding his recent lab results. They are requesting cb so that they know if pt needs to have any new medications picked up.

## 2019-05-22 NOTE — Telephone Encounter (Signed)
Group home calling with BP readings:  05/09/2019: 158/101 102  05/10/2019: 143/105 69  05/11/2019: 140/103  85  05/12/2019: 146/111  116  05/13/2019: 168/124   127  05/14/2019: 147/110  94  05/15/2019: 138/98  116  05/16/2019: 134/75 100  05/17/2019: 142/117    130  05/18/2019: 115/100   98  05/19/2019: 120/94  102  05/20/2019: 116/84 100  05/21/2019: 126/96  123  05/22/2019: 131/96 102  Caretaker said PCP wanted her to take his blood pressure. He will be here on next Thursday for labs.

## 2019-05-29 ENCOUNTER — Ambulatory Visit: Payer: Medicare Other

## 2019-05-29 VITALS — BP 150/100

## 2019-05-29 DIAGNOSIS — D649 Anemia, unspecified: Secondary | ICD-10-CM | POA: Diagnosis not present

## 2019-05-29 DIAGNOSIS — I10 Essential (primary) hypertension: Secondary | ICD-10-CM

## 2019-05-29 MED ORDER — AMLODIPINE BESYLATE 2.5 MG PO TABS: 2.5000 mg | ORAL_TABLET | Freq: Every day | ORAL | 0 refills | Status: DC

## 2019-05-29 NOTE — Progress Notes (Signed)
Patient is here for a blood pressure check. Patient denies chest pain, palpitations, shortness of breath or visual disturbances. At previous visit blood pressure was 158/111 with a heart rate of 91. Today during nurse visit first check blood pressure was 150/100.  He does take any blood pressure medications.  05/01/2019 Visit: Return next week for labs and bp check, if bp still above 140/90 we will add Norvasc 2.5 mg , he cannot take HCTZ because it caused acute renal failure in the past.  Start Norvasc 2.5 mg once daily and come back in 1 month for BP Recheck.

## 2019-05-30 LAB — IRON,TIBC AND FERRITIN PANEL
%SAT: 64 % (calc) — ABNORMAL HIGH (ref 20–48)
Ferritin: 535 ng/mL — ABNORMAL HIGH (ref 38–380)
Iron: 169 ug/dL (ref 50–180)
TIBC: 266 mcg/dL (calc) (ref 250–425)

## 2019-06-02 ENCOUNTER — Other Ambulatory Visit: Payer: Self-pay | Admitting: Family Medicine

## 2019-06-02 DIAGNOSIS — R7989 Other specified abnormal findings of blood chemistry: Secondary | ICD-10-CM

## 2019-06-02 DIAGNOSIS — D649 Anemia, unspecified: Secondary | ICD-10-CM

## 2019-06-02 DIAGNOSIS — E440 Moderate protein-calorie malnutrition: Secondary | ICD-10-CM

## 2019-06-03 ENCOUNTER — Ambulatory Visit: Payer: Medicare Other | Admitting: Family Medicine

## 2019-06-12 ENCOUNTER — Other Ambulatory Visit: Payer: Self-pay

## 2019-06-12 ENCOUNTER — Inpatient Hospital Stay: Payer: Medicare Other | Attending: Oncology | Admitting: Oncology

## 2019-06-12 ENCOUNTER — Telehealth: Payer: Self-pay | Admitting: Oncology

## 2019-06-12 ENCOUNTER — Inpatient Hospital Stay: Payer: Medicare Other

## 2019-06-12 ENCOUNTER — Encounter: Payer: Self-pay | Admitting: Oncology

## 2019-06-12 VITALS — BP 147/76 | HR 83 | Temp 97.8°F | Resp 16 | Wt 130.9 lb

## 2019-06-12 DIAGNOSIS — F840 Autistic disorder: Secondary | ICD-10-CM | POA: Diagnosis not present

## 2019-06-12 DIAGNOSIS — R634 Abnormal weight loss: Secondary | ICD-10-CM | POA: Insufficient documentation

## 2019-06-12 DIAGNOSIS — Z79899 Other long term (current) drug therapy: Secondary | ICD-10-CM | POA: Diagnosis not present

## 2019-06-12 DIAGNOSIS — R7989 Other specified abnormal findings of blood chemistry: Secondary | ICD-10-CM | POA: Insufficient documentation

## 2019-06-12 DIAGNOSIS — I10 Essential (primary) hypertension: Secondary | ICD-10-CM | POA: Insufficient documentation

## 2019-06-12 LAB — CBC WITH DIFFERENTIAL/PLATELET
Abs Immature Granulocytes: 0.05 10*3/uL (ref 0.00–0.07)
Basophils Absolute: 0 10*3/uL (ref 0.0–0.1)
Basophils Relative: 0 %
Eosinophils Absolute: 0.1 10*3/uL (ref 0.0–0.5)
Eosinophils Relative: 1 %
HCT: 38.4 % — ABNORMAL LOW (ref 39.0–52.0)
Hemoglobin: 12.5 g/dL — ABNORMAL LOW (ref 13.0–17.0)
Immature Granulocytes: 1 %
Lymphocytes Relative: 28 %
Lymphs Abs: 2.2 10*3/uL (ref 0.7–4.0)
MCH: 31.6 pg (ref 26.0–34.0)
MCHC: 32.6 g/dL (ref 30.0–36.0)
MCV: 97 fL (ref 80.0–100.0)
Monocytes Absolute: 1.3 10*3/uL — ABNORMAL HIGH (ref 0.1–1.0)
Monocytes Relative: 17 %
Neutro Abs: 4.1 10*3/uL (ref 1.7–7.7)
Neutrophils Relative %: 53 %
Platelets: 165 10*3/uL (ref 150–400)
RBC: 3.96 MIL/uL — ABNORMAL LOW (ref 4.22–5.81)
RDW: 13.7 % (ref 11.5–15.5)
WBC: 7.7 10*3/uL (ref 4.0–10.5)
nRBC: 0 % (ref 0.0–0.2)

## 2019-06-12 LAB — LACTATE DEHYDROGENASE: LDH: 127 U/L (ref 98–192)

## 2019-06-12 LAB — COMPREHENSIVE METABOLIC PANEL
ALT: 14 U/L (ref 0–44)
AST: 17 U/L (ref 15–41)
Albumin: 4 g/dL (ref 3.5–5.0)
Alkaline Phosphatase: 50 U/L (ref 38–126)
Anion gap: 7 (ref 5–15)
BUN: 12 mg/dL (ref 6–20)
CO2: 27 mmol/L (ref 22–32)
Calcium: 9.1 mg/dL (ref 8.9–10.3)
Chloride: 105 mmol/L (ref 98–111)
Creatinine, Ser: 0.7 mg/dL (ref 0.61–1.24)
GFR calc Af Amer: >60 mL/min (ref >60)
GFR calc non Af Amer: >60 mL/min (ref >60)
Glucose, Bld: 83 mg/dL (ref 70–99)
Potassium: 3.9 mmol/L (ref 3.5–5.1)
Sodium: 139 mmol/L (ref 135–145)
Total Bilirubin: 0.5 mg/dL (ref 0.3–1.2)
Total Protein: 7.4 g/dL (ref 6.5–8.1)

## 2019-06-12 LAB — RETICULOCYTES
Immature Retic Fract: 6.4 % (ref 2.3–15.9)
RBC.: 3.99 MIL/uL — ABNORMAL LOW (ref 4.22–5.81)
Retic Count, Absolute: 85 10*3/uL (ref 19.0–186.0)
Retic Ct Pct: 2.1 % (ref 0.4–3.1)

## 2019-06-12 LAB — IRON AND TIBC
Iron: 51 ug/dL (ref 45–182)
Saturation Ratios: 16 % — ABNORMAL LOW (ref 17.9–39.5)
TIBC: 328 ug/dL (ref 250–450)
UIBC: 277 ug/dL

## 2019-06-12 LAB — SEDIMENTATION RATE: Sed Rate: 5 mm/hr (ref 0–15)

## 2019-06-12 LAB — FERRITIN: Ferritin: 337 ng/mL — ABNORMAL HIGH (ref 24–336)

## 2019-06-12 NOTE — Progress Notes (Signed)
Pt new and was sent for high ferritin, and anemia. Pt is not a smoker and does not drink alcohol. Lives in [facility]. He does not feel any different from any other day. He eats he says and drinks water. Does not drink supplements. Has no pain sat 99%.

## 2019-06-12 NOTE — Telephone Encounter (Signed)
CT is scheduled for 07-02-19. Called center on this date and informed of appt information.

## 2019-06-16 ENCOUNTER — Encounter: Payer: Self-pay | Admitting: Oncology

## 2019-06-16 ENCOUNTER — Telehealth: Payer: Self-pay | Admitting: Oncology

## 2019-06-16 NOTE — Progress Notes (Signed)
Hematology/Oncology Consult note [facility] [phone] [phone]  Patient Care Team: Steele Sizer, MD as PCP - General (Family Medicine) Myer Haff, MD as Consulting Physician (Psychiatry)   Name of the patient: David Santos  [id number]  1985-10-21    Reason for referral- elevated ferritin   Referring physician- Dr. Ancil Boozer patient is a 34 year old male with history of severe autism who lives in a group home.  He has been referred to [place] for elevated ferritin.  Date of visit: 06/16/19   History of presenting illness- Patient's most recent ferritin from 05/29/2019 was 535.  His value before that in December was 383.  Iron saturation has been normal in December at 36% elevated at 64% in January.  HIV testing was negative.  C-reactive protein was normal and ESR was normal.  Looking back at his hemoglobin it was around 14 up until 2019 December and was mildly lower at 13.1 in December 2020.  Patient is unable to give any history of his own and he is here with his mother and caregiver from his group home.  They are particularly worried about his ongoing weight loss and states that he has lost over 30 pounds in the last year for no good reason.  His appetite is good and he has not had any recurrent infections or hospitalizations.  ECOG PS- 1  Pain scale- 0   Review of systems-Limited as patient has autism   Review of Systems  Constitutional: Positive for malaise/fatigue and weight loss. Negative for chills and fever.  HENT: Negative for congestion, ear discharge and nosebleeds.   Eyes: Negative for blurred vision.  Respiratory: Negative for cough, hemoptysis, sputum production, shortness of breath and wheezing.   Cardiovascular: Negative for chest pain, palpitations, orthopnea and claudication.  Gastrointestinal: Negative for abdominal pain, blood in stool, constipation, diarrhea, heartburn, melena, nausea and vomiting.  Genitourinary:  Negative for dysuria, flank pain, frequency, hematuria and urgency.  Musculoskeletal: Negative for back pain, joint pain and myalgias.  Skin: Negative for rash.  Neurological: Negative for dizziness, tingling, focal weakness, seizures, weakness and headaches.  Endo/Heme/Allergies: Does not bruise/bleed easily.  Psychiatric/Behavioral: Negative for depression and suicidal ideas. The patient does not have insomnia.     Allergies  Allergen Reactions  . Hydrochlorothiazide     Other reaction(s): Other (See Comments) Acute renal failure    Patient Active Problem List   Diagnosis Date Noted  . Catatonia 04/10/2016  . Schizophreniform disorder ([place]) 10/18/2015  . Autism spectrum disorder   . Dyslipidemia 06/28/2015  . Active autistic disorder 10/25/2014  . Benign essential hypertension 10/25/2014  . Chronic insomnia 10/25/2014  . History of acute renal failure 10/25/2014  . Dysmetabolic syndrome 18/56/3149  . Moderate intellectual disability 10/25/2014  . Perennial allergic rhinitis 10/25/2014  . Obese 10/25/2014  . General psychoses ([place]) 10/25/2014  . Seborrhea capitis 10/25/2014  . Personal history of urethral stricture 10/25/2014  . Vitamin D deficiency 01/15/2009  . Testicular hypofunction 07/17/2008     Past Medical History:  Diagnosis Date  . Allergy   . Autism   . Hyperlipidemia   . Hypertension   . Psychosis [facility])      Past Surgical History:  Procedure Laterality Date  . TONSILLECTOMY AND ADENOIDECTOMY      Social History   Socioeconomic History  . Marital status: Single    Spouse name: Not on file  . Number of children: 0  . Years of education: Not on file  . Highest education  level: Not on file  Occupational History  . Not on file  Tobacco Use  . Smoking status: Never Smoker  . Smokeless tobacco: Never Used  Substance and Sexual Activity  . Alcohol use: No    Alcohol/week: 0.0 standard drinks  . Drug use: No  . Sexual activity: Never  Other  Topics Concern  . Not on file  Social History Narrative   Lives in a group home - [facility]    Social Determinants of Health   Financial Resource Strain:   . Difficulty of Paying Living Expenses: Not on file  Food Insecurity:   . Worried About Charity fundraiser in the Last Year: Not on file  . Ran Out of Food in the Last Year: Not on file  Transportation Needs:   . Lack of Transportation (Medical): Not on file  . Lack of Transportation (Non-Medical): Not on file  Physical Activity:   . Days of Exercise per Week: Not on file  . Minutes of Exercise per Session: Not on file  Stress:   . Feeling of Stress : Not on file  Social Connections:   . Frequency of Communication with Friends and Family: Not on file  . Frequency of Social Gatherings with Friends and Family: Not on file  . Attends Religious Services: Not on file  . Active Member of Clubs or Organizations: Not on file  . Attends Archivist Meetings: Not on file  . Marital Status: Not on file  Intimate Partner Violence:   . Fear of Current or Ex-Partner: Not on file  . Emotionally Abused: Not on file  . Physically Abused: Not on file  . Sexually Abused: Not on file     Family History  Problem Relation Age of Onset  . Hypertension Mother   . Graves' disease Mother   . Breast cancer Mother   . Mental illness Father      Current Outpatient Medications:  .  amLODipine (NORVASC) 2.5 MG tablet, Take 1 tablet (2.5 mg total) by mouth daily., Disp: 30 tablet, Rfl: 0 .  diphenhydrAMINE (BENADRYL) 50 MG tablet, Take 50 mg by mouth at bedtime as needed. , Disp: , Rfl:  .  divalproex (DEPAKOTE ER) 500 MG 24 hr tablet, Take 500 mg by mouth at bedtime., Disp: , Rfl:  .  haloperidol (HALDOL) 10 MG tablet, Take 10 mg by mouth every evening., Disp: , Rfl:  .  lisinopril (ZESTRIL) 20 MG tablet, Take 1 tablet (20 mg total) by mouth daily., Disp: 31 tablet, Rfl: 0 .  loratadine (CLARITIN) 10 MG tablet, Take 1  tablet (10 mg total) by mouth daily., Disp: 31 tablet, Rfl: 10 .  montelukast (SINGULAIR) 10 MG tablet, Take 1 tablet (10 mg total) by mouth at bedtime., Disp: 30 tablet, Rfl: 5 .  risperiDONE (RISPERDAL) 3 MG tablet, Take 6 mg by mouth at bedtime. , Disp: , Rfl:  .  Vitamin D, Ergocalciferol, (DRISDOL) 1.25 MG (50000 UT) CAPS capsule, Take 1 capsule (50,000 Units total) by mouth every 7 (seven) days., Disp: 4 capsule, Rfl: 5   Physical exam:  Vitals:   06/12/19 1126  BP: (!) 147/76  Pulse: 83  Resp: 16  Temp: 97.8 F (36.6 C)  TempSrc: Tympanic  Weight: 130 lb 14.4 oz (59.4 kg)   Physical Exam HENT:     Head: Normocephalic and atraumatic.  Eyes:     Pupils: Pupils are equal, round, and reactive to light.  Cardiovascular:  Rate and Rhythm: Normal rate and regular rhythm.     Heart sounds: Normal heart sounds.  Pulmonary:     Effort: Pulmonary effort is normal.     Breath sounds: Normal breath sounds.  Abdominal:     General: Bowel sounds are normal.     Palpations: Abdomen is soft.     Comments: No palpable hepatosplenomegaly  Musculoskeletal:     Cervical back: Normal range of motion.  Lymphadenopathy:     Comments: No palpable cervical, supraclavicular, axillary or inguinal adenopathy   Skin:    General: Skin is warm and dry.  Neurological:     Mental Status: He is alert and oriented to person, place, and time.        CMP Latest Ref Rng & Units 06/12/2019  Glucose 70 - 99 mg/dL 83  BUN 6 - 20 mg/dL 12  Creatinine 0.61 - 1.24 mg/dL 0.70  Sodium 135 - 145 mmol/L 139  Potassium 3.5 - 5.1 mmol/L 3.9  Chloride 98 - 111 mmol/L 105  CO2 22 - 32 mmol/L 27  Calcium 8.9 - 10.3 mg/dL 9.1  Total Protein 6.5 - 8.1 g/dL 7.4  Total Bilirubin 0.3 - 1.2 mg/dL 0.5  Alkaline Phos 38 - 126 U/L 50  AST 15 - 41 U/L 17  ALT 0 - 44 U/L 14   CBC Latest Ref Rng & Units 06/12/2019  WBC 4.0 - 10.5 K/uL 7.7  Hemoglobin 13.0 - 17.0 g/dL 12.5(L)  Hematocrit 39.0 - 52.0 % 38.4(L)    Platelets 150 - 400 K/uL 165     Assessment and plan- Patient is a 34 y.o. male referred for elevated ferritin  Elevated ferritin: Suspect this is more of an acute phase reactant although his CRP and ESR were normal.  It would be rare to finding hemochromatosis and African-American obesity but I will go ahead and order an HFE gene testing given that his iron saturation was elevated at 64%.  Repeat ferritin CBC and CMP today  Abnormal weight loss: Etiology unclear.  I will check CT chest abdomen pelvis to rule out malignancy if his insurance would approve it  Video visit with patient's caregiver and patient's mother at his group home in about 2 weeks time  Thank you for this kind referral and the opportunity to participate in the care of this patient   Visit Diagnosis 1. Elevated ferritin   2. Loss of weight     Dr. Randa Evens, MD, MPH [facility] at [facility] [phone] 06/16/2019

## 2019-06-16 NOTE — Telephone Encounter (Signed)
Writer phoned center and mother on this date and informed of CT and MD follow up appts.

## 2019-06-18 LAB — HEMOCHROMATOSIS DNA-PCR(C282Y,H63D)

## 2019-06-25 ENCOUNTER — Other Ambulatory Visit: Payer: Self-pay

## 2019-06-25 DIAGNOSIS — I10 Essential (primary) hypertension: Secondary | ICD-10-CM

## 2019-06-25 MED ORDER — AMLODIPINE BESYLATE 2.5 MG PO TABS: 2.5000 mg | ORAL_TABLET | Freq: Every day | ORAL | 0 refills | Status: DC

## 2019-06-25 NOTE — Telephone Encounter (Signed)
Hypertension medication request:  05/01/2019   Last office visit pertaining to hypertension: Amlodipine  BP Readings from Last 3 Encounters:  06/12/19 (!) 147/76  05/29/19 (!) 150/100  05/01/19 (!) 158/111    Lab Results  Component Value Date   CREATININE 0.70 06/12/2019   BUN 12 06/12/2019   NA 139 06/12/2019   K 3.9 06/12/2019   CL 105 06/12/2019   CO2 27 06/12/2019     Follow up on 07/02/2019

## 2019-06-26 ENCOUNTER — Telehealth: Payer: Medicare Other | Admitting: Oncology

## 2019-07-02 ENCOUNTER — Encounter: Payer: Self-pay | Admitting: Family Medicine

## 2019-07-02 ENCOUNTER — Ambulatory Visit
Admission: RE | Admit: 2019-07-02 | Discharge: 2019-07-02 | Disposition: A | Discharge status: Home / Self Care | Payer: Medicare Other | Source: Ambulatory Visit | Attending: Oncology | Admitting: Oncology

## 2019-07-02 ENCOUNTER — Ambulatory Visit (INDEPENDENT_AMBULATORY_CARE_PROVIDER_SITE_OTHER): Payer: Medicare Other | Admitting: Family Medicine

## 2019-07-02 ENCOUNTER — Telehealth: Payer: Self-pay

## 2019-07-02 ENCOUNTER — Other Ambulatory Visit: Payer: Self-pay

## 2019-07-02 DIAGNOSIS — E440 Moderate protein-calorie malnutrition: Secondary | ICD-10-CM | POA: Diagnosis not present

## 2019-07-02 DIAGNOSIS — D649 Anemia, unspecified: Secondary | ICD-10-CM | POA: Diagnosis not present

## 2019-07-02 DIAGNOSIS — I10 Essential (primary) hypertension: Secondary | ICD-10-CM

## 2019-07-02 DIAGNOSIS — J90 Pleural effusion, not elsewhere classified: Secondary | ICD-10-CM | POA: Insufficient documentation

## 2019-07-02 DIAGNOSIS — R7989 Other specified abnormal findings of blood chemistry: Secondary | ICD-10-CM | POA: Insufficient documentation

## 2019-07-02 DIAGNOSIS — Z6821 Body mass index (BMI) 21.0-21.9, adult: Secondary | ICD-10-CM | POA: Insufficient documentation

## 2019-07-02 DIAGNOSIS — F2081 Schizophreniform disorder: Secondary | ICD-10-CM

## 2019-07-02 DIAGNOSIS — R634 Abnormal weight loss: Secondary | ICD-10-CM

## 2019-07-02 DIAGNOSIS — F840 Autistic disorder: Secondary | ICD-10-CM | POA: Insufficient documentation

## 2019-07-02 MED ORDER — AMLODIPINE BESYLATE 2.5 MG PO TABS: 2.5000 mg | ORAL_TABLET | Freq: Every day | ORAL | 5 refills | Status: DC

## 2019-07-02 MED ORDER — LISINOPRIL 20 MG PO TABS: 20.0000 mg | ORAL_TABLET | Freq: Every day | ORAL | 5 refills | Status: DC

## 2019-07-02 MED ORDER — IOHEXOL 300 MG/ML  SOLN
75.0000 mL | Freq: Once | INTRAMUSCULAR | Status: AC | PRN
  Administered 2019-07-02: 15:00:00 75 mL via INTRAVENOUS

## 2019-07-02 MED ORDER — DIAZEPAM 5 MG PO TABS: 5.0000 mg | ORAL_TABLET | Freq: Every day | ORAL | 0 refills | Status: DC

## 2019-07-02 NOTE — Telephone Encounter (Signed)
Copied from CRM [id number]. Topic: General - Other >> Jul 02, 2019 10:58 AM Jaquita Rector A wrote: Reason for CRM: Patient pharmacy called for Dr Carlynn Purl to say that they need a new Rx sent over for diazepam (VALIUM) 5 MG tablet. Original Rx has several different instructions. Please advise

## 2019-07-02 NOTE — Progress Notes (Signed)
Name: David Santos   MRN: [id number]    DOB: 06-09-1985   Date:07/02/2019       Progress Note  Subjective  Chief Complaint  Chief Complaint  Patient presents with  . Follow-up    Needs Depakote on FL2 and PRN Bendaryl medication does he need to keep that on his FL2  . Hypertension    Amlodipine has been helping lowing his BP at home-denies any symptoms    HPI   He came in with caregiver : Jess Barters - patient lives at [facility] in [place]  Hypertension Benign:off HCTZ because it caused acute renal failure. He lives in a group home  [facility] )During his last visit his bp was elevated and we added Norvasc 2.5 mg to lisinopril 20 mg daily, bp today is at goal. He denies dizziness, chest pain, palpitation   Malnutrition: : he was first placed in a group home in 2017 His weight seems to oscillate at one point he . Lost a total of 45 lbs in a period of one year  but gained34 lbs afterwards, currently he is down 56 lbs in the past year. Caregiver states the staff states he has a good appetite. He also has anemia and we sent him to see hematologist for further evaluation of weight loss , labs were repeated ferritin improved down from 535 to 337, anemia slightly worse . He is scheduled for CT chest, abdomen and pelvis to rule out cancer.   Schizophrenia, autism:caregiverstates he does not seem depressed even though he is not going to day camp because of COVID-19. He asks caregivers to take him out for a ride.Marland Kitchen He does not engage with other residents because of autism,he still paces and talks to himself - stable. Unchanged    Patient Active Problem List   Diagnosis Date Noted  . Catatonia 04/10/2016  . Schizophreniform disorder (HCC) 10/18/2015  . Autism spectrum disorder   . Dyslipidemia 06/28/2015  . Active autistic disorder 10/25/2014  . Benign essential hypertension 10/25/2014  . Chronic insomnia 10/25/2014  . History of acute renal failure 10/25/2014   . Dysmetabolic syndrome 10/25/2014  . Moderate intellectual disability 10/25/2014  . Perennial allergic rhinitis 10/25/2014  . Obese 10/25/2014  . General psychoses (HCC) 10/25/2014  . Seborrhea capitis 10/25/2014  . Personal history of urethral stricture 10/25/2014  . Vitamin D deficiency 01/15/2009  . Testicular hypofunction 07/17/2008    Past Surgical History:  Procedure Laterality Date  . TONSILLECTOMY AND ADENOIDECTOMY      Family History  Problem Relation Age of Onset  . Hypertension Mother   . Graves' disease Mother   . Breast cancer Mother   . Mental illness Father     Social History   Tobacco Use  . Smoking status: Never Smoker  . Smokeless tobacco: Never Used  Substance Use Topics  . Alcohol use: No    Alcohol/week: 0.0 standard drinks  . Drug use: No     Current Outpatient Medications:  .  amLODipine (NORVASC) 2.5 MG tablet, Take 1 tablet (2.5 mg total) by mouth daily., Disp: 30 tablet, Rfl: 0 .  diphenhydrAMINE (BENADRYL) 50 MG tablet, Take 50 mg by mouth at bedtime as needed. , Disp: , Rfl:  .  divalproex (DEPAKOTE ER) 500 MG 24 hr tablet, Take 500 mg by mouth at bedtime., Disp: , Rfl:  .  haloperidol (HALDOL) 10 MG tablet, Take 10 mg by mouth every evening., Disp: , Rfl:  .  lisinopril (  ZESTRIL) 20 MG tablet, Take 1 tablet (20 mg total) by mouth daily., Disp: 31 tablet, Rfl: 0 .  loratadine (CLARITIN) 10 MG tablet, Take 1 tablet (10 mg total) by mouth daily., Disp: 31 tablet, Rfl: 10 .  montelukast (SINGULAIR) 10 MG tablet, Take 1 tablet (10 mg total) by mouth at bedtime., Disp: 30 tablet, Rfl: 5 .  risperiDONE (RISPERDAL) 3 MG tablet, Take 6 mg by mouth at bedtime. , Disp: , Rfl:  .  Vitamin D, Ergocalciferol, (DRISDOL) 1.25 MG (50000 UT) CAPS capsule, Take 1 capsule (50,000 Units total) by mouth every 7 (seven) days., Disp: 4 capsule, Rfl: 5  Allergies  Allergen Reactions  . Hydrochlorothiazide     Other reaction(s): Other (See Comments) Acute  renal failure    I personally reviewed active problem list, medication list, allergies, family history, social history, health maintenance with the patient/caregiver today.   ROS  Ten systems reviewed and is negative except as mentioned in HPI   Objective  Vitals:   07/02/19 0905  BP: 110/70  Pulse: 100  Resp: 16  Temp: 97.7 F (36.5 C)  TempSrc: Temporal  SpO2: 99%  Weight: 127 lb 6.4 oz (57.8 kg)  Height: 5\' 4"  (1.626 m)    Body mass index is 21.87 kg/m.  Physical Exam  Constitutional: Patient appears well-developed and well-nourished.  No distress.  HEENT: head atraumatic, normocephalic, pupils equal and reactive to light Cardiovascular: Normal rate, regular rhythm and normal heart sounds.  No murmur heard. No BLE edema. Pulmonary/Chest: Effort normal and breath sounds normal. No respiratory distress. Abdominal: Soft.  There is no tenderness. Muscular skeletal: tapping thumbs but calm and cooperative Psychiatric: Patient copperative   Recent Results (from the past 2160 hour(s))  CBC with Differential/Platelet     Status: Abnormal   Collection Time: 05/08/19  9:55 AM  Result Value Ref Range   WBC 5.2 3.8 - 10.8 Thousand/uL   RBC 4.01 (L) 4.20 - 5.80 Million/uL   Hemoglobin 13.1 (L) 13.2 - 17.1 g/dL   HCT 05/10/19 93.8 - 18.2 %   MCV 96.5 80.0 - 100.0 fL   MCH 32.7 27.0 - 33.0 pg   MCHC 33.9 32.0 - 36.0 g/dL   RDW 99.3 71.6 - 96.7 %   Platelets 199 140 - 400 Thousand/uL   MPV 9.9 7.5 - 12.5 fL   Neutro Abs 2,522 1,500 - 7,800 cells/uL   Lymphs Abs 1,732 850 - 3,900 cells/uL   Absolute Monocytes 775 200 - 950 cells/uL   Eosinophils Absolute 140 15 - 500 cells/uL   Basophils Absolute 31 0 - 200 cells/uL   Neutrophils Relative % 48.5 %   Total Lymphocyte 33.3 %   Monocytes Relative 14.9 %   Eosinophils Relative 2.7 %   Basophils Relative 0.6 %  COMPLETE METABOLIC PANEL WITH GFR     Status: Abnormal   Collection Time: 05/08/19  9:55 AM  Result Value Ref Range    Glucose, Bld 107 (H) 65 - 99 mg/dL    Comment: .            Fasting reference interval . For someone without known diabetes, a glucose value between 100 and 125 mg/dL is consistent with prediabetes and should be confirmed with a follow-up test. .    BUN 7 7 - 25 mg/dL   Creat 05/10/19 8.10 - 1.75 mg/dL   GFR, Est Non African American 120 > OR = 60 mL/min/1.8m2   GFR, Est African American 139 > OR =  60 mL/min/1.8173m2   BUN/Creatinine Ratio NOT APPLICABLE 6 - 22 (calc)   Sodium 142 135 - 146 mmol/L   Potassium 3.7 3.5 - 5.3 mmol/L   Chloride 104 98 - 110 mmol/L   CO2 31 20 - 32 mmol/L   Calcium 9.1 8.6 - 10.3 mg/dL   Total Protein 6.6 6.1 - 8.1 g/dL   Albumin 4.1 3.6 - 5.1 g/dL   Globulin 2.5 1.9 - 3.7 g/dL (calc)   AG Ratio 1.6 1.0 - 2.5 (calc)   Total Bilirubin 0.3 0.2 - 1.2 mg/dL   Alkaline phosphatase (APISO) 45 36 - 130 U/L   AST 14 10 - 40 U/L   ALT 11 9 - 46 U/L  Hemoglobin A1c     Status: None   Collection Time: 05/08/19  9:55 AM  Result Value Ref Range   Hgb A1c MFr Bld 5.0 <5.7 % of total Hgb    Comment: For the purpose of screening for the presence of diabetes: . <5.7%       Consistent with the absence of diabetes 5.7-6.4%    Consistent with increased risk for diabetes             (prediabetes) > or =6.5%  Consistent with diabetes . This assay result is consistent with a decreased risk of diabetes. . Currently, no consensus exists regarding use of hemoglobin A1c for diagnosis of diabetes in children. . According to American Diabetes Association (ADA) guidelines, hemoglobin A1c <7.0% represents optimal control in non-pregnant diabetic patients. Different metrics may apply to specific patient populations.  Standards of Medical Care in Diabetes(ADA). .    Mean Plasma Glucose 97 (calc)   eAG (mmol/L) 5.4 (calc)  TSH     Status: None   Collection Time: 05/08/19  9:55 AM  Result Value Ref Range   TSH 1.87 0.40 - 4.50 mIU/L  Lipid panel     Status: Abnormal    Collection Time: 05/08/19  9:55 AM  Result Value Ref Range   Cholesterol 156 <200 mg/dL   HDL 52 > OR = 40 mg/dL   Triglycerides 829156 (H) <150 mg/dL   LDL Cholesterol (Calc) 79 mg/dL (calc)    Comment: Reference range: <100 . Desirable range <100 mg/dL for primary prevention;   <70 mg/dL for patients with CHD or diabetic patients  with > or = 2 CHD risk factors. Marland Kitchen. LDL-C is now calculated using the Martin-Hopkins  calculation, which is a validated novel method providing  better accuracy than the Friedewald equation in the  estimation of LDL-C.  Horald PollenMartin SS et al. Lenox AhrJAMA. 5621;308(652013;310(19): 2061-2068  (http://education.[facility].com/faq/FAQ164)    Total CHOL/HDL Ratio 3.0 <5.0 (calc)   Non-HDL Cholesterol (Calc) 104 <130 mg/dL (calc)    Comment: For patients with diabetes plus 1 major ASCVD risk  factor, treating to a non-HDL-C goal of <100 mg/dL  (LDL-C of <78<70 mg/dL) is considered a therapeutic  option.   Sedimentation rate     Status: None   Collection Time: 05/08/19  9:55 AM  Result Value Ref Range   Sed Rate 2 0 - 15 mm/h  C-reactive protein     Status: None   Collection Time: 05/08/19  9:55 AM  Result Value Ref Range   CRP 0.4 <8.0 mg/L  HIV Antibody (routine testing w rflx)     Status: None   Collection Time: 05/08/19  9:55 AM  Result Value Ref Range   HIV 1&2 Ab, 4th Generation NON-REACTIVE NON-REACTI    Comment: HIV-1 antigen  and HIV-1/HIV-2 antibodies were not detected. There is no laboratory evidence of HIV infection. Marland Kitchen PLEASE NOTE: This information has been disclosed to you from records whose confidentiality may be protected by state law.  If your state requires such protection, then the state law prohibits you from making any further disclosure of the information without the specific written consent of the person to whom it pertains, or as otherwise permitted by law. A general authorization for the release of medical or other information is NOT sufficient  for this purpose. . For additional information please refer to http://education.[facility].com/faq/FAQ106 (This link is being provided for informational/ educational purposes only.) . Marland Kitchen The performance of this assay has not been clinically validated in patients less than 78 years old. .   Iron, TIBC and Ferritin Panel     Status: Abnormal   Collection Time: 05/08/19  9:55 AM  Result Value Ref Range   Iron 118 50 - 180 mcg/dL   TIBC 220 254 - 270 mcg/dL (calc)   %SAT 36 20 - 48 % (calc)   Ferritin 383 (H) 38 - 380 ng/mL  TEST AUTHORIZATION     Status: None   Collection Time: 05/08/19  9:55 AM  Result Value Ref Range   TEST NAME: IRON, TIBC AND FERRITIN PANEL    TEST CODE: [id number]    CLIENT CONTACT: LESLIE SMITH    REPORT ALWAYS MESSAGE SIGNATURE      Comment: . The laboratory testing on this patient was verbally requested or confirmed by the ordering physician or his or her authorized representative after contact with an employee of [employer]. Federal regulations require that we maintain on file written authorization for all laboratory testing.  Accordingly we are asking that the ordering physician or his or her authorized representative sign a copy of this report and promptly return it to the client service representative. . . Signature:____________________________________________________ . Please fax this signed page to [phone] or return it via your [employer] courier.   Iron, TIBC and Ferritin Panel     Status: Abnormal   Collection Time: 05/29/19 10:01 AM  Result Value Ref Range   Iron 169 50 - 180 mcg/dL   TIBC 176 160 - 737 mcg/dL (calc)   %SAT 64 (H) 20 - 48 % (calc)   Ferritin 535 (H) 38 - 380 ng/mL  Iron and TIBC     Status: Abnormal   Collection Time: 06/12/19 12:20 PM  Result Value Ref Range   Iron 51 45 - 182 ug/dL   TIBC 106 269 - 485 ug/dL   Saturation Ratios 16 (L) 17.9 - 39.5 %   UIBC 277 ug/dL    Comment: Performed at  [facility], [address] [place], [place] [address]  Sedimentation rate     Status: None   Collection Time: 06/12/19 12:20 PM  Result Value Ref Range   Sed Rate 5 0 - 15 mm/hr    Comment: Performed at [facility], [address] [place], [place] [address]  Reticulocytes     Status: Abnormal   Collection Time: 06/12/19 12:20 PM  Result Value Ref Range   Retic Ct Pct 2.1 0.4 - 3.1 %   RBC. 3.99 (L) 4.22 - 5.81 MIL/uL   Retic Count, Absolute 85.0 19.0 - 186.0 K/uL   Immature Retic Fract 6.4 2.3 - 15.9 %    Comment: Performed at [facility], [address] [place], [place] [address]  Lactate dehydrogenase     Status:  None   Collection Time: 06/12/19 12:20 PM  Result Value Ref Range   LDH 127 98 - 192 U/L    Comment: Performed at [facility], [address] [place], [place] [address]  Hemochromatosis DNA-PCR(c282y,h63d)     Status: None   Collection Time: 06/12/19 12:20 PM  Result Value Ref Range   DNA Mutation Analysis Comment     Comment: (NOTE) NO MUTATION IDENTIFIED Interpretation: This patient's sample was analyzed for the hereditary hemochromatosis (HH) mutations C282Y, H63D, S65C. No mutation was identified. The mutations analyzed by [facility] are most common in the Caucasian population, and up to 90% of affected Caucasians will have a positive test result. Because this panel does not identify rare HH mutations or HH mutations found in other ethnic groups, there are a small number of people who may have a negative test but may actually be affected. The diagnosis of HH should include clinical findings and other test results such as transferrin-iron saturation and/or serum ferritin studies and/or liver biopsy.  If this patient has a history of HH, in many cases a specific carrier risk can be determined based on this negative result. Methodology: DNA Analysis of the HFE gene was performed by PCR amplification followed by restriction  enzyme digestion analyses. Reference: Justice Rocher and Walker AP. (20 00). Genet Test 4:97-101. Cogswell ME et al. (1999). AM J Prev Med 16:134-140. Bomford A (2002). Lancet 360(9346):1673-81. Dorina Hoyer et al. (2002). Blood Cells, Molecules. and Diseases.  29(3):418-432. Imperatore G et al. (2003). Genet Med. 5(1):1-8. Cogswell ME et al. (2003). Genet Med. 5(4):304-10. This test was developed and its performance characteristics determined by [facility]. It has not been cleared or approved by the Food and Drug Administration. Genetic counselors are available for health care providers to discuss results at 1-800-345-GENE. Vincenza Hews, PhD, [facility] Ernestene Mention, PhD, [facility] Gillian Shields, PhD, [facility] Manya Silvas, PhD, [facility] Bonnielee Haff, PhD, [facility] Alpha Gula, PhD, [facility] Performed At: [facility] [address] [place], [place] [id number] Maurine Simmering MDPhD [id number]   Comprehensive metabolic panel     Status: None   Collection Time: 06/12/19 12:20 PM  Result Value Ref Range   Sodium 139 135 - 145 mmol/L   Potassium 3.9 3.5 - 5.1 mmol/L   Chloride 105 98 - 111 mmol/L   CO2 27 22 - 32 mmol/L   Glucose, Bld 83 70 - 99 mg/dL   BUN 12 6 - 20 mg/dL   Creatinine, Ser 5.46 0.61 - 1.24 mg/dL   Calcium 9.1 8.9 - 50.3 mg/dL   Total Protein 7.4 6.5 - 8.1 g/dL   Albumin 4.0 3.5 - 5.0 g/dL   AST 17 15 - 41 U/L   ALT 14 0 - 44 U/L   Alkaline Phosphatase 50 38 - 126 U/L   Total Bilirubin 0.5 0.3 - 1.2 mg/dL   GFR calc non Af Amer >60 >60 mL/min   GFR calc Af Amer >60 >60 mL/min   Anion gap 7 5 - 15    Comment: Performed at [facility], [address] [place], [place] [address]  Ferritin     Status: Abnormal   Collection Time: 06/12/19 12:20 PM  Result Value Ref Range   Ferritin 337 (H) 24 - 336 ng/mL    Comment: Performed at [facility], [address] [place], [place] [address]  CBC with Differential/Platelet     Status: Abnormal    Collection Time: 06/12/19 12:20 PM  Result  Value Ref Range   WBC 7.7 4.0 - 10.5 K/uL   RBC 3.96 (L) 4.22 - 5.81 MIL/uL   Hemoglobin 12.5 (L) 13.0 - 17.0 g/dL   HCT 38.4 (L) 39.0 - 52.0 %   MCV 97.0 80.0 - 100.0 fL   MCH 31.6 26.0 - 34.0 pg   MCHC 32.6 30.0 - 36.0 g/dL   RDW 13.7 11.5 - 15.5 %   Platelets 165 150 - 400 K/uL   nRBC 0.0 0.0 - 0.2 %   Neutrophils Relative % 53 %   Neutro Abs 4.1 1.7 - 7.7 K/uL   Lymphocytes Relative 28 %   Lymphs Abs 2.2 0.7 - 4.0 K/uL   Monocytes Relative 17 %   Monocytes Absolute 1.3 (H) 0.1 - 1.0 K/uL   Eosinophils Relative 1 %   Eosinophils Absolute 0.1 0.0 - 0.5 K/uL   Basophils Relative 0 %   Basophils Absolute 0.0 0.0 - 0.1 K/uL   Immature Granulocytes 1 %   Abs Immature Granulocytes 0.05 0.00 - 0.07 K/uL    Comment: Performed at [facility], [address] [place], [place] [address]     PHQ2/9: Depression screen [facility] 2/9 07/02/2019 05/01/2019 09/02/2018 04/29/2018 04/23/2017  Decreased Interest 0 0 0 0 0  Down, Depressed, Hopeless 0 0 0 0 0  PHQ - 2 Score 0 0 0 0 0  Altered sleeping 0 0 0 0 -  Tired, decreased energy 0 0 0 0 -  Change in appetite 0 0 0 0 -  Feeling bad or failure about yourself  0 0 0 0 -  Trouble concentrating 0 0 0 0 -  Moving slowly or fidgety/restless 0 0 0 0 -  Suicidal thoughts 0 0 0 0 -  PHQ-9 Score 0 0 0 0 -  Difficult doing work/chores Not difficult at all - Not difficult at all Not difficult at all -    phq 9 is negative   Fall Risk: Fall Risk  07/02/2019 05/01/2019 04/29/2018 04/02/2018 09/14/2017  Falls in the past year? 0 0 0 0 No  Number falls in past yr: 0 0 - 0 -  Injury with Fall? 0 0 - - -  Follow up - - - - -     Functional Status Survey: Is the patient deaf or have difficulty hearing?: No Does the patient have difficulty seeing, even when wearing glasses/contacts?: Yes Does the patient have difficulty concentrating, remembering, or making decisions?: Yes Does the patient have  difficulty walking or climbing stairs?: No Does the patient have difficulty dressing or bathing?: No Does the patient have difficulty doing errands alone such as visiting a doctor's office or shopping?: Yes    Assessment & Plan  1. Elevated ferritin level   2. Moderate protein-calorie malnutrition ([place])  Getting CT done today   3. Benign essential hypertension  At goal , continue medication , sending refill today   4. Schizophreniform disorder ([place])  Prior to procedure today  - diazepam (VALIUM) 5 MG tablet; Take 1-2 tablets (5-10 mg total) by mouth daily. 30 minutes prior to procedure , and again right before  Dispense: 2 tablet; Refill: 0  5. Anemia, unspecified type  Keep follow up with Dr. Janese Banks

## 2019-07-02 NOTE — Telephone Encounter (Signed)
Pharmacy called. Pharmacy aware. 

## 2019-07-03 ENCOUNTER — Inpatient Hospital Stay (HOSPITAL_BASED_OUTPATIENT_CLINIC_OR_DEPARTMENT_OTHER): Payer: Medicare Other | Admitting: Oncology

## 2019-07-03 ENCOUNTER — Encounter: Payer: Self-pay | Admitting: Oncology

## 2019-07-03 DIAGNOSIS — R7989 Other specified abnormal findings of blood chemistry: Secondary | ICD-10-CM | POA: Diagnosis not present

## 2019-07-03 NOTE — Progress Notes (Signed)
Patient's caregiver stated that patient had lost weight (30 lbs) and were very concerned. Even though they have tried for him to lose weight as told by his PCP, they were just worried.

## 2019-07-06 NOTE — Progress Notes (Signed)
I connected with David Santos on 07/06/19 at  2:45 PM EST by video enabled telemedicine visit and verified that I am speaking with the correct person using two identifiers.patient has developmental disorder and unable to give history   I discussed the limitations, risks, security and privacy concerns of performing an evaluation and management service by telemedicine and the availability of in-person appointments. I also discussed with the patient that there may be a patient responsible charge related to this service. The patient expressed understanding and agreed to proceed.  Other persons participating in the visit and their role in the encounter:  Patients motehr and caregiver from group home  Patient's location:  Group home Provider's location:  work  Risk analyst Complaint:  Discuss blood work and ct scan results  History of present illness: Patient is a 34 year old male with past medical history significant for schizophreniform disorder and autism spectrum disorder who is a resident of a group home.  He was referred for high ferritin.Patient's most recent ferritin from 05/29/2019 was 535.  His value before that in December was 383.  Iron saturation has been normal in December at 36% elevated at 64% in January.  HIV testing was negative.  C-reactive protein was normal and ESR was normal.  Looking back at his hemoglobin it was around 14 up until 2019 December and was mildly lower at 13.1 in December 2020.  Patient is unable to give any history of his own   Interval history: No acute issues at patient's group home.   Review of Systems  Unable to perform ROS: Psychiatric disorder    Allergies  Allergen Reactions  . Hydrochlorothiazide     Other reaction(s): Other (See Comments) Acute renal failure    Past Medical History:  Diagnosis Date  . Allergy   . Autism   . Hyperlipidemia   . Hypertension   . Psychosis [facility])     Past Surgical History:  Procedure Laterality Date  . TONSILLECTOMY  AND ADENOIDECTOMY      Social History   Socioeconomic History  . Marital status: Single    Spouse name: Not on file  . Number of children: 0  . Years of education: Not on file  . Highest education level: Not on file  Occupational History  . Not on file  Tobacco Use  . Smoking status: Never Smoker  . Smokeless tobacco: Never Used  Substance and Sexual Activity  . Alcohol use: No    Alcohol/week: 0.0 standard drinks  . Drug use: No  . Sexual activity: Never  Other Topics Concern  . Not on file  Social History Narrative   Lives in a group home - [place]    Social Determinants of Health   Financial Resource Strain:   . Difficulty of Paying Living Expenses: Not on file  Food Insecurity:   . Worried About Charity fundraiser in the Last Year: Not on file  . Ran Out of Food in the Last Year: Not on file  Transportation Needs:   . Lack of Transportation (Medical): Not on file  . Lack of Transportation (Non-Medical): Not on file  Physical Activity:   . Days of Exercise per Week: Not on file  . Minutes of Exercise per Session: Not on file  Stress:   . Feeling of Stress : Not on file  Social Connections:   . Frequency of Communication with Friends and Family: Not on file  . Frequency of Social Gatherings with Friends and Family: Not  on file  . Attends Religious Services: Not on file  . Active Member of Clubs or Organizations: Not on file  . Attends Archivist Meetings: Not on file  . Marital Status: Not on file  Intimate Partner Violence:   . Fear of Current or Ex-Partner: Not on file  . Emotionally Abused: Not on file  . Physically Abused: Not on file  . Sexually Abused: Not on file    Family History  Problem Relation Age of Onset  . Hypertension Mother   . Graves' disease Mother   . Breast cancer Mother   . Mental illness Father      Current Outpatient Medications:  .  amLODipine (NORVASC) 2.5 MG tablet, Take 1 tablet (2.5 mg total) by  mouth daily., Disp: 30 tablet, Rfl: 5 .  diazepam (VALIUM) 5 MG tablet, Take 1-2 tablets (5-10 mg total) by mouth daily. 30 minutes prior to procedure , and again right before, Disp: 2 tablet, Rfl: 0 .  divalproex (DEPAKOTE ER) 500 MG 24 hr tablet, Take 500 mg by mouth at bedtime., Disp: , Rfl:  .  haloperidol (HALDOL) 10 MG tablet, Take 10 mg by mouth every evening., Disp: , Rfl:  .  lisinopril (ZESTRIL) 20 MG tablet, Take 1 tablet (20 mg total) by mouth daily., Disp: 31 tablet, Rfl: 5 .  loratadine (CLARITIN) 10 MG tablet, Take 1 tablet (10 mg total) by mouth daily., Disp: 31 tablet, Rfl: 10 .  montelukast (SINGULAIR) 10 MG tablet, Take 1 tablet (10 mg total) by mouth at bedtime., Disp: 30 tablet, Rfl: 5 .  risperiDONE (RISPERDAL) 3 MG tablet, Take 6 mg by mouth at bedtime. , Disp: , Rfl:  .  Vitamin D, Ergocalciferol, (DRISDOL) 1.25 MG (50000 UT) CAPS capsule, Take 1 capsule (50,000 Units total) by mouth every 7 (seven) days., Disp: 4 capsule, Rfl: 5  CT Chest W Contrast  Result Date: 07/03/2019 CLINICAL DATA:  Patient with weight loss. Elevated. Ten. EXAM: CT CHEST, ABDOMEN, AND PELVIS WITH CONTRAST TECHNIQUE: Multidetector CT imaging of the chest, abdomen and pelvis was performed following the standard protocol during bolus administration of intravenous contrast. CONTRAST:  5m OMNIPAQUE IOHEXOL 300 MG/ML  SOLN COMPARISON:  None. FINDINGS: CT CHEST FINDINGS Cardiovascular: Normal heart size. No pericardial effusion. Aorta and main pulmonary artery are normal in caliber. Mediastinum/Nodes: No enlarged axillary, mediastinal or hilar lymphadenopathy. Normal esophagus. Lungs/Pleura: Central airways are patent. No large area of pulmonary consolidation. Small right and trace left pleural effusions. No pneumothorax. Musculoskeletal: No aggressive or acute appearing osseous lesions. CT ABDOMEN PELVIS FINDINGS Hepatobiliary: Liver is normal in size and contour. Gallbladder is decompressed. Pancreas:  Unremarkable Spleen: Unremarkable Adrenals/Urinary Tract: Normal adrenal glands. Kidneys enhance symmetrically with contrast. No hydronephrosis. Urinary bladder is decompressed. Stomach/Bowel: Oral contrast material throughout the small and large bowel. Normal appendix. No evidence for bowel obstruction. No free fluid or free intraperitoneal air. Normal morphology of the stomach. Vascular/Lymphatic: Normal caliber abdominal aorta. No retroperitoneal lymphadenopathy. Reproductive: Unremarkable. Other: None. Musculoskeletal: No aggressive or acute appearing osseous lesions. IMPRESSION: 1. Small right and trace left pleural effusions. 2. No acute process within the chest, abdomen or pelvis. Electronically Signed   By: DLovey NewcomerM.D.   On: 07/03/2019 09:27   CT Abdomen Pelvis W Contrast  Result Date: 07/03/2019 CLINICAL DATA:  Patient with weight loss. Elevated. Ten. EXAM: CT CHEST, ABDOMEN, AND PELVIS WITH CONTRAST TECHNIQUE: Multidetector CT imaging of the chest, abdomen and pelvis was performed following the standard protocol  during bolus administration of intravenous contrast. CONTRAST:  25m OMNIPAQUE IOHEXOL 300 MG/ML  SOLN COMPARISON:  None. FINDINGS: CT CHEST FINDINGS Cardiovascular: Normal heart size. No pericardial effusion. Aorta and main pulmonary artery are normal in caliber. Mediastinum/Nodes: No enlarged axillary, mediastinal or hilar lymphadenopathy. Normal esophagus. Lungs/Pleura: Central airways are patent. No large area of pulmonary consolidation. Small right and trace left pleural effusions. No pneumothorax. Musculoskeletal: No aggressive or acute appearing osseous lesions. CT ABDOMEN PELVIS FINDINGS Hepatobiliary: Liver is normal in size and contour. Gallbladder is decompressed. Pancreas: Unremarkable Spleen: Unremarkable Adrenals/Urinary Tract: Normal adrenal glands. Kidneys enhance symmetrically with contrast. No hydronephrosis. Urinary bladder is decompressed. Stomach/Bowel: Oral contrast  material throughout the small and large bowel. Normal appendix. No evidence for bowel obstruction. No free fluid or free intraperitoneal air. Normal morphology of the stomach. Vascular/Lymphatic: Normal caliber abdominal aorta. No retroperitoneal lymphadenopathy. Reproductive: Unremarkable. Other: None. Musculoskeletal: No aggressive or acute appearing osseous lesions. IMPRESSION: 1. Small right and trace left pleural effusions. 2. No acute process within the chest, abdomen or pelvis. Electronically Signed   By: DLovey NewcomerM.D.   On: 07/03/2019 09:27    No images are attached to the encounter.   CMP Latest Ref Rng & Units 06/12/2019  Glucose 70 - 99 mg/dL 83  BUN 6 - 20 mg/dL 12  Creatinine 0.61 - 1.24 mg/dL 0.70  Sodium 135 - 145 mmol/L 139  Potassium 3.5 - 5.1 mmol/L 3.9  Chloride 98 - 111 mmol/L 105  CO2 22 - 32 mmol/L 27  Calcium 8.9 - 10.3 mg/dL 9.1  Total Protein 6.5 - 8.1 g/dL 7.4  Total Bilirubin 0.3 - 1.2 mg/dL 0.5  Alkaline Phos 38 - 126 U/L 50  AST 15 - 41 U/L 17  ALT 0 - 44 U/L 14   CBC Latest Ref Rng & Units 06/12/2019  WBC 4.0 - 10.5 K/uL 7.7  Hemoglobin 13.0 - 17.0 g/dL 12.5(L)  Hematocrit 39.0 - 52.0 % 38.4(L)  Platelets 150 - 400 K/uL 165    Assessment and plan: Patient is a 34year old male who was referred for elevated ferritin and abnormal weight loss  CT chest abdomen and pelvis with contrast did not reveal any evidence of malignancy.  He was noted to have trace right and left pleural effusion.  He is asymptomatic from that standpoint and does not require repeat CT at that time.  Based on blood work and CT scan results no concerning findings for malignancy.  With regards to elevated ferritin he has mildly underinflated ferritin of 337.  Iron studies show a low iron saturation of 16% and therefore this is not consistent with iron overload.  Hemochromatosis work-up was also negative.  This can be monitored conservatively without any further work-up  Follow-up  instructions: I will see him back in 4 months with CBC with differential and ferritin levels  I discussed the assessment and treatment plan with the patient. The patient was provided an opportunity to ask questions and all were answered. The patient agreed with the plan and demonstrated an understanding of the instructions.   The patient was advised to call back or seek an in-person evaluation if the symptoms worsen or if the condition fails to improve as anticipated.   Visit Diagnosis: 1. Elevated ferritin     Dr. ARanda Evens MD, MPH [facility]at [facility] HospitalTel- [phone]/28/2021 3:57 PM

## 2019-07-10 ENCOUNTER — Ambulatory Visit: Payer: Medicare Other | Admitting: Family Medicine

## 2019-07-17 ENCOUNTER — Other Ambulatory Visit: Payer: Self-pay

## 2019-07-17 NOTE — Patient Outreach (Signed)
[facility]) Care Management  07/17/2019  David Santos 12/21/1985 [id number]   Medication Adherence call to David Santos HIPPA Compliant Voice message left with a call back number. David Santos is showing past due on Lisinopril 20 mg under [employer].   David Santos CPhT Pharmacy Technician [facility] Care Management Direct Dial [phone]  Fax [phone] [email or web address]

## 2019-07-22 ENCOUNTER — Ambulatory Visit: Payer: Medicare Other | Admitting: Family Medicine

## 2019-08-01 ENCOUNTER — Other Ambulatory Visit: Payer: Self-pay

## 2019-08-01 ENCOUNTER — Ambulatory Visit (INDEPENDENT_AMBULATORY_CARE_PROVIDER_SITE_OTHER): Payer: Medicare Other | Admitting: Family Medicine

## 2019-08-01 ENCOUNTER — Encounter: Payer: Self-pay | Admitting: Family Medicine

## 2019-08-01 VITALS — BP 130/88 | HR 103 | Temp 98.5°F | Ht 64.0 in | Wt 132.0 lb

## 2019-08-01 DIAGNOSIS — Z7689 Persons encountering health services in other specified circumstances: Secondary | ICD-10-CM

## 2019-08-01 DIAGNOSIS — F2081 Schizophreniform disorder: Secondary | ICD-10-CM

## 2019-08-01 DIAGNOSIS — F840 Autistic disorder: Secondary | ICD-10-CM

## 2019-08-01 DIAGNOSIS — R7989 Other specified abnormal findings of blood chemistry: Secondary | ICD-10-CM | POA: Diagnosis not present

## 2019-08-01 DIAGNOSIS — E559 Vitamin D deficiency, unspecified: Secondary | ICD-10-CM | POA: Diagnosis not present

## 2019-08-01 DIAGNOSIS — I10 Essential (primary) hypertension: Secondary | ICD-10-CM

## 2019-08-01 DIAGNOSIS — J3089 Other allergic rhinitis: Secondary | ICD-10-CM

## 2019-08-01 NOTE — Progress Notes (Signed)
BP 130/88   Pulse (!) 103   Temp 98.5 F (36.9 C) (Oral)   Ht 5\' 4"  (1.626 m)   Wt 132 lb (59.9 kg)   SpO2 96%   BMI 22.66 kg/m    Subjective:    Patient ID: David Santos, male    DOB: November 10, 1985, 34 y.o.   MRN: [id number]  HPI: David Santos is a 34 y.o. male  Chief Complaint  Patient presents with  . Establish Care   Patient presenting today to establish care.   Hx of autism, insomnia, and schizophreniform disorder followed by Psychiatry. Lives in a group home but does have close family support. Mother is here today with him and provides nearly all the history.   HTN - on lisinopril and amlodipine, stable BPs on this regimen. Gets BPs checked regularly at group home. Denies side effects, CP, SOB, HAs, dizziness.   Vit D deficiency - On weekly supplement, due for recheck on levels.   Had some mildly low hg 04/2019, due for recheck. Used to take multivitamin with iron but does not at this time. Denies fatigue, Cp, SOB, overt bleeding noticed.   Allergies - on singulair, claritin regimen which keeps sxs under good control.   No new concerns today.   Relevant past medical, surgical, family and social history reviewed and updated as indicated. Interim medical history since our last visit reviewed. Allergies and medications reviewed and updated.  Review of Systems  Per HPI unless specifically indicated above     Objective:    BP 130/88   Pulse (!) 103   Temp 98.5 F (36.9 C) (Oral)   Ht 5\' 4"  (1.626 m)   Wt 132 lb (59.9 kg)   SpO2 96%   BMI 22.66 kg/m   Wt Readings from Last 3 Encounters:  08/01/19 132 lb (59.9 kg)  07/02/19 127 lb 6.4 oz (57.8 kg)  06/12/19 130 lb 14.4 oz (59.4 kg)    Physical Exam Vitals and nursing note reviewed.  Constitutional:      Appearance: Normal appearance.  HENT:     Head: Atraumatic.  Eyes:     Extraocular Movements: Extraocular movements intact.     Conjunctiva/sclera: Conjunctivae normal.  Cardiovascular:   Rate and Rhythm: Normal rate and regular rhythm.  Pulmonary:     Effort: Pulmonary effort is normal.     Breath sounds: Normal breath sounds.  Musculoskeletal:        General: Normal range of motion.     Cervical back: Normal range of motion and neck supple.  Skin:    General: Skin is warm and dry.  Neurological:     Mental Status: Mental status is at baseline.  Psychiatric:        Mood and Affect: Mood normal.     Results for orders placed or performed in visit on 06/12/19  Iron and TIBC  Result Value Ref Range   Iron 51 45 - 182 ug/dL   TIBC 328 250 - 450 ug/dL   Saturation Ratios 16 (L) 17.9 - 39.5 %   UIBC 277 ug/dL  Sedimentation rate  Result Value Ref Range   Sed Rate 5 0 - 15 mm/hr  Reticulocytes  Result Value Ref Range   Retic Ct Pct 2.1 0.4 - 3.1 %   RBC. 3.99 (L) 4.22 - 5.81 MIL/uL   Retic Count, Absolute 85.0 19.0 - 186.0 K/uL   Immature Retic Fract 6.4 2.3 - 15.9 %  Lactate dehydrogenase  Result Value  Ref Range   LDH 127 98 - 192 U/L  Hemochromatosis DNA-PCR(c282y,h63d)  Result Value Ref Range   DNA Mutation Analysis Comment   Comprehensive metabolic panel  Result Value Ref Range   Sodium 139 135 - 145 mmol/L   Potassium 3.9 3.5 - 5.1 mmol/L   Chloride 105 98 - 111 mmol/L   CO2 27 22 - 32 mmol/L   Glucose, Bld 83 70 - 99 mg/dL   BUN 12 6 - 20 mg/dL   Creatinine, Ser 9.47 0.61 - 1.24 mg/dL   Calcium 9.1 8.9 - 09.6 mg/dL   Total Protein 7.4 6.5 - 8.1 g/dL   Albumin 4.0 3.5 - 5.0 g/dL   AST 17 15 - 41 U/L   ALT 14 0 - 44 U/L   Alkaline Phosphatase 50 38 - 126 U/L   Total Bilirubin 0.5 0.3 - 1.2 mg/dL   GFR calc non Af Amer >60 >60 mL/min   GFR calc Af Amer >60 >60 mL/min   Anion gap 7 5 - 15  Ferritin  Result Value Ref Range   Ferritin 337 (H) 24 - 336 ng/mL  CBC with Differential/Platelet  Result Value Ref Range   WBC 7.7 4.0 - 10.5 K/uL   RBC 3.96 (L) 4.22 - 5.81 MIL/uL   Hemoglobin 12.5 (L) 13.0 - 17.0 g/dL   HCT 28.3 (L) 66.2 - 94.7 %    MCV 97.0 80.0 - 100.0 fL   MCH 31.6 26.0 - 34.0 pg   MCHC 32.6 30.0 - 36.0 g/dL   RDW 65.4 65.0 - 35.4 %   Platelets 165 150 - 400 K/uL   nRBC 0.0 0.0 - 0.2 %   Neutrophils Relative % 53 %   Neutro Abs 4.1 1.7 - 7.7 K/uL   Lymphocytes Relative 28 %   Lymphs Abs 2.2 0.7 - 4.0 K/uL   Monocytes Relative 17 %   Monocytes Absolute 1.3 (H) 0.1 - 1.0 K/uL   Eosinophils Relative 1 %   Eosinophils Absolute 0.1 0.0 - 0.5 K/uL   Basophils Relative 0 %   Basophils Absolute 0.0 0.0 - 0.1 K/uL   Immature Granulocytes 1 %   Abs Immature Granulocytes 0.05 0.00 - 0.07 K/uL      Assessment & Plan:   Problem List Items Addressed This Visit      Cardiovascular and Mediastinum   Benign essential hypertension    Stable and well controlled, continue current regimen        Respiratory   Perennial allergic rhinitis    Stable and under good control, continue current regimen        Other   Vitamin D deficiency - Primary    Recheck vit D, adjust as needed. Continue current regimen      Relevant Orders   Vitamin D (25 hydroxy)   Autism spectrum disorder    Followed by Behavioral Health, continue per their recommendations      Schizophreniform disorder (HCC)    Followed by Psychiatry, continue per their recommendations       Other Visit Diagnoses    Encounter to establish care       Elevated ferritin           Follow up plan: Return in about 6 months (around 02/01/2020) for 6 month f/u.

## 2019-08-01 NOTE — Assessment & Plan Note (Signed)
Stable and under good control, continue current regimen 

## 2019-08-01 NOTE — Assessment & Plan Note (Signed)
Followed by Psychiatry, continue per their recommendations 

## 2019-08-01 NOTE — Assessment & Plan Note (Signed)
Followed by [facility], continue per their recommendations

## 2019-08-01 NOTE — Assessment & Plan Note (Signed)
Stable and well controlled, continue current regimen 

## 2019-08-01 NOTE — Assessment & Plan Note (Signed)
Recheck vit D, adjust as needed. Continue current regimen

## 2019-08-02 LAB — CBC WITH DIFFERENTIAL/PLATELET
Basophils Absolute: 0 10*3/uL (ref 0.0–0.2)
Basos: 1 %
EOS (ABSOLUTE): 0.1 10*3/uL (ref 0.0–0.4)
Eos: 2 %
Hematocrit: 40.2 % (ref 37.5–51.0)
Hemoglobin: 13.8 g/dL (ref 13.0–17.7)
Immature Grans (Abs): 0 10*3/uL (ref 0.0–0.1)
Immature Granulocytes: 0 %
Lymphocytes Absolute: 1.8 10*3/uL (ref 0.7–3.1)
Lymphs: 38 %
MCH: 33.3 pg — ABNORMAL HIGH (ref 26.6–33.0)
MCHC: 34.3 g/dL (ref 31.5–35.7)
MCV: 97 fL (ref 79–97)
Monocytes Absolute: 0.5 10*3/uL (ref 0.1–0.9)
Monocytes: 11 %
Neutrophils Absolute: 2.4 10*3/uL (ref 1.4–7.0)
Neutrophils: 48 %
Platelets: 204 10*3/uL (ref 150–450)
RBC: 4.14 x10E6/uL (ref 4.14–5.80)
RDW: 13.3 % (ref 11.6–15.4)
WBC: 4.9 10*3/uL (ref 3.4–10.8)

## 2019-08-02 LAB — FERRITIN: Ferritin: 477 ng/mL — ABNORMAL HIGH (ref 30–400)

## 2019-08-02 LAB — VITAMIN D 25 HYDROXY (VIT D DEFICIENCY, FRACTURES): Vit D, 25-Hydroxy: 80.9 ng/mL (ref 30.0–100.0)

## 2019-09-29 ENCOUNTER — Ambulatory Visit: Payer: Medicare Other | Admitting: Family Medicine

## 2019-10-29 ENCOUNTER — Other Ambulatory Visit: Payer: Self-pay

## 2019-10-29 DIAGNOSIS — Z87448 Personal history of other diseases of urinary system: Secondary | ICD-10-CM

## 2019-10-29 DIAGNOSIS — R7989 Other specified abnormal findings of blood chemistry: Secondary | ICD-10-CM

## 2019-10-31 ENCOUNTER — Other Ambulatory Visit: Payer: Self-pay | Admitting: *Deleted

## 2019-10-31 ENCOUNTER — Encounter: Payer: Self-pay | Admitting: Oncology

## 2019-10-31 ENCOUNTER — Inpatient Hospital Stay: Payer: Medicare Other | Attending: Oncology

## 2019-10-31 ENCOUNTER — Inpatient Hospital Stay (HOSPITAL_BASED_OUTPATIENT_CLINIC_OR_DEPARTMENT_OTHER): Payer: Medicare Other | Admitting: Oncology

## 2019-10-31 ENCOUNTER — Other Ambulatory Visit: Payer: Self-pay

## 2019-10-31 VITALS — BP 139/94 | HR 94 | Temp 97.2°F | Resp 16 | Wt 143.3 lb

## 2019-10-31 DIAGNOSIS — Z803 Family history of malignant neoplasm of breast: Secondary | ICD-10-CM | POA: Diagnosis not present

## 2019-10-31 DIAGNOSIS — Z8249 Family history of ischemic heart disease and other diseases of the circulatory system: Secondary | ICD-10-CM | POA: Diagnosis not present

## 2019-10-31 DIAGNOSIS — Z79899 Other long term (current) drug therapy: Secondary | ICD-10-CM | POA: Insufficient documentation

## 2019-10-31 DIAGNOSIS — R7989 Other specified abnormal findings of blood chemistry: Secondary | ICD-10-CM | POA: Insufficient documentation

## 2019-10-31 DIAGNOSIS — Z8349 Family history of other endocrine, nutritional and metabolic diseases: Secondary | ICD-10-CM | POA: Diagnosis not present

## 2019-10-31 DIAGNOSIS — F840 Autistic disorder: Secondary | ICD-10-CM | POA: Diagnosis not present

## 2019-10-31 DIAGNOSIS — E785 Hyperlipidemia, unspecified: Secondary | ICD-10-CM | POA: Diagnosis not present

## 2019-10-31 DIAGNOSIS — I10 Essential (primary) hypertension: Secondary | ICD-10-CM | POA: Diagnosis not present

## 2019-10-31 LAB — CBC WITH DIFFERENTIAL/PLATELET
Abs Immature Granulocytes: 0.01 10*3/uL (ref 0.00–0.07)
Basophils Absolute: 0 10*3/uL (ref 0.0–0.1)
Basophils Relative: 1 %
Eosinophils Absolute: 0 10*3/uL (ref 0.0–0.5)
Eosinophils Relative: 1 %
HCT: 40.2 % (ref 39.0–52.0)
Hemoglobin: 13.7 g/dL (ref 13.0–17.0)
Immature Granulocytes: 0 %
Lymphocytes Relative: 36 %
Lymphs Abs: 2.2 10*3/uL (ref 0.7–4.0)
MCH: 31.6 pg (ref 26.0–34.0)
MCHC: 34.1 g/dL (ref 30.0–36.0)
MCV: 92.8 fL (ref 80.0–100.0)
Monocytes Absolute: 0.7 10*3/uL (ref 0.1–1.0)
Monocytes Relative: 12 %
Neutro Abs: 3 10*3/uL (ref 1.7–7.7)
Neutrophils Relative %: 50 %
Platelets: 204 10*3/uL (ref 150–400)
RBC: 4.33 MIL/uL (ref 4.22–5.81)
RDW: 12.9 % (ref 11.5–15.5)
WBC: 6 10*3/uL (ref 4.0–10.5)
nRBC: 0 % (ref 0.0–0.2)

## 2019-10-31 LAB — FERRITIN: Ferritin: 198 ng/mL (ref 24–336)

## 2019-10-31 NOTE — Progress Notes (Signed)
PT JUST STATES THAT HE HAS BACK PAIN- IT IS IN THE MIDDLE, THEN HE HAS PAIN IN MIDDLE ABDOMEN BUT POINTS TO UPPER MIDDLE. THIS IS NOT NEW PAIN

## 2019-11-02 ENCOUNTER — Encounter: Payer: Self-pay | Admitting: Oncology

## 2019-11-02 NOTE — Progress Notes (Signed)
Hematology/Oncology Consult note [facility]  [phone] [phone]  Patient Care Team: Volney American, PA-C as PCP - General (Family Medicine) Myer Haff, MD as Consulting Physician (Psychiatry) Sindy Guadeloupe, MD as Consulting Physician (Oncology)   Name of the patient: David Santos  [id number]  12/29/1985   Date of visit: 11/02/19  Diagnosis-elevated ferritin likely reactive  Chief complaint/ Reason for visit-routine follow-up of elevated ferritin  Heme/Onc history: Patient is a 34 year old male with past medical history significant for schizophreniform disorder and autism spectrum disorder who is a resident of a group home.  He was referred for high ferritin.Patient's most recent ferritin from 05/29/2019 was 535. His value before that in December was 383. Iron saturation has been normal in December at 36% elevated at 64% in January. HIV testing was negative. C-reactive protein was normal and ESR was normal. Looking back at his hemoglobin it was around 14 up until 2019 December and was mildly lower at 13.1 in December 2020. Patient is unable to give any history of his own   Interval history-patient is currently doing well at his group home.  He has not had any hospitalizations.  Currently reports some mild low back pain but denies other complaints  ECOG PS- 1 Pain scale- 3   Review of systems- Review of Systems  Constitutional: Negative for chills, fever, malaise/fatigue and weight loss.  HENT: Negative for congestion, ear discharge and nosebleeds.   Eyes: Negative for blurred vision.  Respiratory: Negative for cough, hemoptysis, sputum production, shortness of breath and wheezing.   Cardiovascular: Negative for chest pain, palpitations, orthopnea and claudication.  Gastrointestinal: Negative for abdominal pain, blood in stool, constipation, diarrhea, heartburn, melena, nausea and vomiting.  Genitourinary: Negative  for dysuria, flank pain, frequency, hematuria and urgency.  Musculoskeletal: Positive for back pain. Negative for joint pain and myalgias.  Skin: Negative for rash.  Neurological: Negative for dizziness, tingling, focal weakness, seizures, weakness and headaches.  Endo/Heme/Allergies: Does not bruise/bleed easily.  Psychiatric/Behavioral: Negative for depression and suicidal ideas. The patient does not have insomnia.       Allergies  Allergen Reactions   Hydrochlorothiazide     Other reaction(s): Other (See Comments) Acute renal failure     Past Medical History:  Diagnosis Date   Allergy    Autism    Hyperlipidemia    Hypertension    Psychosis ([place])    Vitamin D deficiency      Past Surgical History:  Procedure Laterality Date   TONSILLECTOMY AND ADENOIDECTOMY      Social History   Socioeconomic History   Marital status: Single    Spouse name: Not on file   Number of children: 0   Years of education: Not on file   Highest education level: Not on file  Occupational History   Not on file  Tobacco Use   Smoking status: Never Smoker   Smokeless tobacco: Never Used  Vaping Use   Vaping Use: Never used  Substance and Sexual Activity   Alcohol use: No    Alcohol/week: 0.0 standard drinks   Drug use: No   Sexual activity: Never  Other Topics Concern   Not on file  Social History Narrative   Lives in a group home - [place]    Social Determinants of Health   Financial Resource Strain:    Difficulty of Paying Living Expenses:   Food Insecurity:    Worried About Charity fundraiser in  the Last Year:    Arboriculturist in the Last Year:   Transportation Needs:    Film/video editor (Medical):    Lack of Transportation (Non-Medical):   Physical Activity:    Days of Exercise per Week:    Minutes of Exercise per Session:   Stress:    Feeling of Stress :   Social Connections:    Frequency of Communication with  Friends and Family:    Frequency of Social Gatherings with Friends and Family:    Attends Religious Services:    Active Member of Clubs or Organizations:    Attends Music therapist:    Marital Status:   Intimate Partner Violence:    Fear of Current or Ex-Partner:    Emotionally Abused:    Physically Abused:    Sexually Abused:     Family History  Problem Relation Age of Onset   Hypertension Mother    Berenice Primas' disease Mother    Breast cancer Mother    Mental illness Father      Current Outpatient Medications:    amLODipine (NORVASC) 2.5 MG tablet, Take 1 tablet (2.5 mg total) by mouth daily., Disp: 30 tablet, Rfl: 5   diazepam (VALIUM) 5 MG tablet, Take 5 mg by mouth as directed., Disp: , Rfl:    diphenhydrAMINE (BENADRYL) 50 MG capsule, Take 50 mg by mouth every 6 (six) hours as needed., Disp: , Rfl:    divalproex (DEPAKOTE ER) 500 MG 24 hr tablet, Take 500 mg by mouth at bedtime., Disp: , Rfl:    haloperidol (HALDOL) 10 MG tablet, Take 10 mg by mouth 3 (three) times daily. , Disp: , Rfl:    lisinopril (ZESTRIL) 20 MG tablet, Take 1 tablet (20 mg total) by mouth daily., Disp: 31 tablet, Rfl: 5   loratadine (CLARITIN) 10 MG tablet, Take 1 tablet (10 mg total) by mouth daily., Disp: 31 tablet, Rfl: 10   montelukast (SINGULAIR) 10 MG tablet, Take 1 tablet (10 mg total) by mouth at bedtime., Disp: 30 tablet, Rfl: 5   risperiDONE (RISPERDAL) 3 MG tablet, Take 6 mg by mouth at bedtime. , Disp: , Rfl:    Vitamin D, Ergocalciferol, (DRISDOL) 1.25 MG (50000 UT) CAPS capsule, Take 1 capsule (50,000 Units total) by mouth every 7 (seven) days., Disp: 4 capsule, Rfl: 5  Physical exam:  Vitals:   10/31/19 1055  BP: (!) 139/94  Pulse: 94  Resp: 16  Temp: (!) 97.2 F (36.2 C)  TempSrc: Tympanic  Weight: 143 lb 4.8 oz (65 kg)   Physical Exam Constitutional:      General: He is not in acute distress. Cardiovascular:     Rate and Rhythm: Normal rate  and regular rhythm.     Heart sounds: Normal heart sounds.  Pulmonary:     Effort: Pulmonary effort is normal.     Breath sounds: Normal breath sounds.  Abdominal:     General: Bowel sounds are normal.     Palpations: Abdomen is soft.  Skin:    General: Skin is warm and dry.  Neurological:     Mental Status: He is alert.      CMP Latest Ref Rng & Units 06/12/2019  Glucose 70 - 99 mg/dL 83  BUN 6 - 20 mg/dL 12  Creatinine 0.61 - 1.24 mg/dL 0.70  Sodium 135 - 145 mmol/L 139  Potassium 3.5 - 5.1 mmol/L 3.9  Chloride 98 - 111 mmol/L 105  CO2 22 - 32 mmol/L  27  Calcium 8.9 - 10.3 mg/dL 9.1  Total Protein 6.5 - 8.1 g/dL 7.4  Total Bilirubin 0.3 - 1.2 mg/dL 0.5  Alkaline Phos 38 - 126 U/L 50  AST 15 - 41 U/L 17  ALT 0 - 44 U/L 14   CBC Latest Ref Rng & Units 10/31/2019  WBC 4.0 - 10.5 K/uL 6.0  Hemoglobin 13.0 - 17.0 g/dL 13.7  Hematocrit 39 - 52 % 40.2  Platelets 150 - 400 K/uL 204     Assessment and plan- Patient is a 34 y.o. male referred for elevated ferritin likely reactive no further routine follow-up  Repeat ferritin at this time is normal at 198.  CBC with differential is also normal.  Clinically he is doing well LFTs in the past have also been normal.  I will see him 1 more time in 6 months and from that point onwards he can continue to follow-up with Dr. Ancil Boozer   Visit Diagnosis 1. Elevated ferritin      Dr. Randa Evens, MD, MPH [facility] at [facility] [phone] 11/02/2019 10:31 AM

## 2020-02-02 ENCOUNTER — Ambulatory Visit: Payer: Medicare Other | Admitting: Family Medicine

## 2020-02-02 ENCOUNTER — Ambulatory Visit (INDEPENDENT_AMBULATORY_CARE_PROVIDER_SITE_OTHER): Payer: Medicare Other | Admitting: Nurse Practitioner

## 2020-02-02 ENCOUNTER — Encounter: Payer: Self-pay | Admitting: Nurse Practitioner

## 2020-02-02 ENCOUNTER — Other Ambulatory Visit: Payer: Self-pay

## 2020-02-02 VITALS — BP 149/117 | HR 98 | Temp 98.6°F | Resp 16 | Ht 65.0 in | Wt 151.2 lb

## 2020-02-02 DIAGNOSIS — E559 Vitamin D deficiency, unspecified: Secondary | ICD-10-CM | POA: Diagnosis not present

## 2020-02-02 DIAGNOSIS — I10 Essential (primary) hypertension: Secondary | ICD-10-CM | POA: Diagnosis not present

## 2020-02-02 DIAGNOSIS — F2081 Schizophreniform disorder: Secondary | ICD-10-CM

## 2020-02-02 DIAGNOSIS — Z23 Encounter for immunization: Secondary | ICD-10-CM

## 2020-02-02 DIAGNOSIS — J3089 Other allergic rhinitis: Secondary | ICD-10-CM | POA: Diagnosis not present

## 2020-02-02 MED ORDER — LISINOPRIL 20 MG PO TABS: 20.0000 mg | ORAL_TABLET | Freq: Every day | ORAL | 1 refills | Status: DC

## 2020-02-02 MED ORDER — AMLODIPINE BESYLATE 2.5 MG PO TABS: 2.5000 mg | ORAL_TABLET | Freq: Every day | ORAL | 1 refills | Status: DC

## 2020-02-02 NOTE — Assessment & Plan Note (Signed)
Chronic, stable on current regimen.  Continue loratadine and montelukast.  Patient to return to clinic if symptoms persist despite current regimen.

## 2020-02-02 NOTE — Assessment & Plan Note (Signed)
Chronic, stable.  Last check showed vitamin D level of 80.  We will recheck today and discussed potentially decreasing dose to daily dose.

## 2020-02-02 NOTE — Assessment & Plan Note (Signed)
Chronic, stable per patient and caregiver, David Santos.  Continue to follow with psychiatry and per their recommendations.

## 2020-02-02 NOTE — Patient Instructions (Signed)
DASH Eating Plan DASH stands for "Dietary Approaches to Stop Hypertension." The DASH eating plan is a healthy eating plan that has been shown to reduce high blood pressure (hypertension). It may also reduce your risk for type 2 diabetes, heart disease, and stroke. The DASH eating plan may also help with weight loss. What are tips for following this plan?  General guidelines  Avoid eating more than 2,300 mg (milligrams) of salt (sodium) a day. If you have hypertension, you may need to reduce your sodium intake to 1,500 mg a day.  Limit alcohol intake to no more than 1 drink a day for nonpregnant women and 2 drinks a day for men. One drink equals 12 oz of beer, 5 oz of wine, or 1 oz of hard liquor.  Work with your health care provider to maintain a healthy body weight or to lose weight. Ask what an ideal weight is for you.  Get at least 30 minutes of exercise that causes your heart to beat faster (aerobic exercise) most days of the week. Activities may include walking, swimming, or biking.  Work with your health care provider or diet and nutrition specialist (dietitian) to adjust your eating plan to your individual calorie needs. Reading food labels   Check food labels for the amount of sodium per serving. Choose foods with less than 5 percent of the Daily Value of sodium. Generally, foods with less than 300 mg of sodium per serving fit into this eating plan.  To find whole grains, look for the word "whole" as the first word in the ingredient list. Shopping  Buy products labeled as "low-sodium" or "no salt added."  Buy fresh foods. Avoid canned foods and premade or frozen meals. Cooking  Avoid adding salt when cooking. Use salt-free seasonings or herbs instead of table salt or sea salt. Check with your health care provider or pharmacist before using salt substitutes.  Do not fry foods. Cook foods using healthy methods such as baking, boiling, grilling, and broiling instead.  Cook with  heart-healthy oils, such as olive, canola, soybean, or sunflower oil. Meal planning  Eat a balanced diet that includes: ? 5 or more servings of fruits and vegetables each day. At each meal, try to fill half of your plate with fruits and vegetables. ? Up to 6-8 servings of whole grains each day. ? Less than 6 oz of lean meat, poultry, or fish each day. A 3-oz serving of meat is about the same size as a deck of cards. One egg equals 1 oz. ? 2 servings of low-fat dairy each day. ? A serving of nuts, seeds, or beans 5 times each week. ? Heart-healthy fats. Healthy fats called Omega-3 fatty acids are found in foods such as flaxseeds and coldwater fish, like sardines, salmon, and mackerel.  Limit how much you eat of the following: ? Canned or prepackaged foods. ? Food that is high in trans fat, such as fried foods. ? Food that is high in saturated fat, such as fatty meat. ? Sweets, desserts, sugary drinks, and other foods with added sugar. ? Full-fat dairy products.  Do not salt foods before eating.  Try to eat at least 2 vegetarian meals each week.  Eat more home-cooked food and less restaurant, buffet, and fast food.  When eating at a restaurant, ask that your food be prepared with less salt or no salt, if possible. What foods are recommended? The items listed may not be a complete list. Talk with your dietitian about   what dietary choices are best for you. Grains Whole-grain or whole-wheat bread. Whole-grain or whole-wheat pasta. Brown rice. Oatmeal. Quinoa. Bulgur. Whole-grain and low-sodium cereals. Pita bread. Low-fat, low-sodium crackers. Whole-wheat flour tortillas. Vegetables Fresh or frozen vegetables (raw, steamed, roasted, or grilled). Low-sodium or reduced-sodium tomato and vegetable juice. Low-sodium or reduced-sodium tomato sauce and tomato paste. Low-sodium or reduced-sodium canned vegetables. Fruits All fresh, dried, or frozen fruit. Canned fruit in natural juice (without  added sugar). Meat and other protein foods Skinless chicken or turkey. Ground chicken or turkey. Pork with fat trimmed off. Fish and seafood. Egg whites. Dried beans, peas, or lentils. Unsalted nuts, nut butters, and seeds. Unsalted canned beans. Lean cuts of beef with fat trimmed off. Low-sodium, lean deli meat. Dairy Low-fat (1%) or fat-free (skim) milk. Fat-free, low-fat, or reduced-fat cheeses. Nonfat, low-sodium ricotta or cottage cheese. Low-fat or nonfat yogurt. Low-fat, low-sodium cheese. Fats and oils Soft margarine without trans fats. Vegetable oil. Low-fat, reduced-fat, or light mayonnaise and salad dressings (reduced-sodium). Canola, safflower, olive, soybean, and sunflower oils. Avocado. Seasoning and other foods Herbs. Spices. Seasoning mixes without salt. Unsalted popcorn and pretzels. Fat-free sweets. What foods are not recommended? The items listed may not be a complete list. Talk with your dietitian about what dietary choices are best for you. Grains Baked goods made with fat, such as croissants, muffins, or some breads. Dry pasta or rice meal packs. Vegetables Creamed or fried vegetables. Vegetables in a cheese sauce. Regular canned vegetables (not low-sodium or reduced-sodium). Regular canned tomato sauce and paste (not low-sodium or reduced-sodium). Regular tomato and vegetable juice (not low-sodium or reduced-sodium). Pickles. Olives. Fruits Canned fruit in a light or heavy syrup. Fried fruit. Fruit in cream or butter sauce. Meat and other protein foods Fatty cuts of meat. Ribs. Fried meat. Bacon. Sausage. Bologna and other processed lunch meats. Salami. Fatback. Hotdogs. Bratwurst. Salted nuts and seeds. Canned beans with added salt. Canned or smoked fish. Whole eggs or egg yolks. Chicken or turkey with skin. Dairy Whole or 2% milk, cream, and half-and-half. Whole or full-fat cream cheese. Whole-fat or sweetened yogurt. Full-fat cheese. Nondairy creamers. Whipped toppings.  Processed cheese and cheese spreads. Fats and oils Butter. Stick margarine. Lard. Shortening. Ghee. Bacon fat. Tropical oils, such as coconut, palm kernel, or palm oil. Seasoning and other foods Salted popcorn and pretzels. Onion salt, garlic salt, seasoned salt, table salt, and sea salt. Worcestershire sauce. Tartar sauce. Barbecue sauce. Teriyaki sauce. Soy sauce, including reduced-sodium. Steak sauce. Canned and packaged gravies. Fish sauce. Oyster sauce. Cocktail sauce. Horseradish that you find on the shelf. Ketchup. Mustard. Meat flavorings and tenderizers. Bouillon cubes. Hot sauce and Tabasco sauce. Premade or packaged marinades. Premade or packaged taco seasonings. Relishes. Regular salad dressings. Where to find more information:  National Heart, Lung, and Blood Institute: www.nhlbi.nih.gov  American Heart Association: www.heart.org Summary  The DASH eating plan is a healthy eating plan that has been shown to reduce high blood pressure (hypertension). It may also reduce your risk for type 2 diabetes, heart disease, and stroke.  With the DASH eating plan, you should limit salt (sodium) intake to 2,300 mg a day. If you have hypertension, you may need to reduce your sodium intake to 1,500 mg a day.  When on the DASH eating plan, aim to eat more fresh fruits and vegetables, whole grains, lean proteins, low-fat dairy, and heart-healthy fats.  Work with your health care provider or diet and nutrition specialist (dietitian) to adjust your eating plan to your   individual calorie needs. This information is not intended to replace advice given to you by your health care provider. Make sure you discuss any questions you have with your health care provider. Document Revised: 04/06/2017 Document Reviewed: 04/17/2016 Elsevier Patient Education  2020 Elsevier Inc.  

## 2020-02-02 NOTE — Assessment & Plan Note (Addendum)
Chronic, ongoing.  Blood pressure above goal in office today, but is usually in the 120s over 80s at group home.  Will not make medication adjustments at this time, but continue to monitor blood pressure at group home daily.  Return to clinic if blood pressure maintaining over 140/90 on a regular basis.  CMP checked today.  Continue amlodipine 2.5 mg and lisinopril 20 mg daily.  Refills given.  Follow-up in 6 months.

## 2020-02-02 NOTE — Progress Notes (Signed)
BP (!) 149/117 (BP Location: Left Arm, Patient Position: Sitting, Cuff Size: Normal)   Pulse 98   Temp 98.6 F (37 C) (Oral)   Resp 16   Ht 5\' 5"  (1.651 m)   Wt 151 lb 3.2 oz (68.6 kg)   SpO2 97%   BMI 25.16 kg/m    Subjective:    Patient ID: , male    DOB: March 23, 1986, 34 y.o.   MRN: [id number]  HPI: David Santos is a 34 y.o. male presenting for follow up with caregiver, 36.  Chief Complaint  Patient presents with  . Follow-up   HYPERTENSION Hypertension status: controlled  Satisfied with current treatment? yes Duration of hypertension: chronic BP monitoring frequency:  daily BP range: 120s/80s BP medication side effects:  no Medication compliance: excellent compliance Previous BP meds: amlodipine 2.5 mg, lisinopril 20 mg daily Aspirin: no Recurrent headaches: no Visual changes: no Palpitations: no Dyspnea: no Chest pain: no Lower extremity edema: no Dizzy/lightheaded: no  VITAMIN D DEFICIENCY Taking the vitamin D once weekly.  Reports doing well with no issues.  MOOD Reports mood is stable, continues to follow with Psychiatry.   Duration:controlled Anxious mood: no  Excessive worrying: no Irritability: no  Sweating: no Nausea: no Palpitations:no Hyperventilation: no Panic attacks: no Agoraphobia: no  Obscessions/compulsions: no Depressed mood: no Anhedonia: no Weight changes: no Insomnia: no   Hypersomnia: no Fatigue/loss of energy: no Feelings of worthlessness: no Feelings of guilt: no Impaired concentration/indecisiveness: no Suicidal ideations: no  Crying spells: no Recent Stressors/Life Changes: no   Relationship problems: no   Family stress: no     Financial stress: no    Job stress: no    Recent death/loss: no   ALLERGIES Duration: chronic Runny nose: no  Nasal congestion: no Nasal itching: no Sneezing: no Eye swelling, itching or discharge: no Post nasal drip: no Cough: no Sinus pressure: no  Ear  pain: no  Ear pressure: no  Fever: no  Symptoms occur seasonally: no Symptoms occur perenially: no Satisfied with current treatment: yes Allergist evaluation in past: no Allergen injection immunotherapy: no Recurrent sinus infections: no ENT evaluation in past: no Known environmental allergy: no Indoor pets: no History of asthma: no Current allergy medications:  Treatments attempted: loratadine 10, singulair 10, benadryl as needed  Allergies  Allergen Reactions  . Hydrochlorothiazide     Other reaction(s): Other (See Comments) Acute renal failure   Outpatient Encounter Medications as of 02/02/2020  Medication Sig Note  . amLODipine (NORVASC) 2.5 MG tablet Take 1 tablet (2.5 mg total) by mouth daily.   . diazepam (VALIUM) 5 MG tablet Take 5 mg by mouth as directed. 10/31/2019: TAKE 1 TABLET 30 MIN BEFORE PROCEDURE AND REPEAT 30 MIN IF NEEDED  . diphenhydrAMINE (BENADRYL) 50 MG capsule Take 50 mg by mouth every 6 (six) hours as needed.   . divalproex (DEPAKOTE ER) 500 MG 24 hr tablet Take 500 mg by mouth at bedtime. 10/31/2019: Pt take 1 tablet in am and 2 tablets aT BEDTIME  . haloperidol (HALDOL) 10 MG tablet Take 10 mg by mouth 3 (three) times daily.    11/02/2019 lisinopril (ZESTRIL) 20 MG tablet Take 1 tablet (20 mg total) by mouth daily.   Marland Kitchen loratadine (CLARITIN) 10 MG tablet Take 1 tablet (10 mg total) by mouth daily.   . montelukast (SINGULAIR) 10 MG tablet Take 1 tablet (10 mg total) by mouth at bedtime.   . risperiDONE (RISPERDAL) 3 MG tablet Take 6  mg by mouth at bedtime.    . Vitamin D, Ergocalciferol, (DRISDOL) 1.25 MG (50000 UT) CAPS capsule Take 1 capsule (50,000 Units total) by mouth every 7 (seven) days.   . [DISCONTINUED] amLODipine (NORVASC) 2.5 MG tablet Take 1 tablet (2.5 mg total) by mouth daily.   . [DISCONTINUED] lisinopril (ZESTRIL) 20 MG tablet Take 1 tablet (20 mg total) by mouth daily.    No facility-administered encounter medications on file as of 02/02/2020.    Patient Active Problem List   Diagnosis Date Noted  . Catatonia 04/10/2016  . Schizophreniform disorder (HCC) 10/18/2015  . Autism spectrum disorder   . Dyslipidemia 06/28/2015  . Active autistic disorder 10/25/2014  . Benign essential hypertension 10/25/2014  . Chronic insomnia 10/25/2014  . History of acute renal failure 10/25/2014  . Dysmetabolic syndrome 10/25/2014  . Moderate intellectual disability 10/25/2014  . Perennial allergic rhinitis 10/25/2014  . Obese 10/25/2014  . General psychoses (HCC) 10/25/2014  . Seborrhea capitis 10/25/2014  . Personal history of urethral stricture 10/25/2014  . Vitamin D deficiency 01/15/2009  . Testicular hypofunction 07/17/2008   Past Medical History:  Diagnosis Date  . Allergy   . Autism   . Hyperlipidemia   . Hypertension   . Psychosis (HCC)   . Vitamin D deficiency    Relevant past medical, surgical, family and social history reviewed and updated as indicated. Interim medical history since our last visit reviewed.  Review of Systems  Constitutional: Negative.  Negative for activity change, appetite change, diaphoresis, fatigue and fever.  HENT: Negative.   Eyes: Negative.  Negative for visual disturbance.  Respiratory: Negative.  Negative for cough, shortness of breath and wheezing.   Cardiovascular: Negative.  Negative for chest pain, palpitations and leg swelling.  Gastrointestinal: Negative.  Negative for abdominal pain, blood in stool, constipation, diarrhea, nausea and vomiting.  Skin: Negative.  Negative for rash.  Neurological: Negative.  Negative for dizziness, weakness, light-headedness and headaches.  Psychiatric/Behavioral: Negative.  Negative for confusion, self-injury and sleep disturbance.   Per HPI unless specifically indicated above     Objective:    BP (!) 149/117 (BP Location: Left Arm, Patient Position: Sitting, Cuff Size: Normal)   Pulse 98   Temp 98.6 F (37 C) (Oral)   Resp 16   Ht 5\' 5"  (1.651  m)   Wt 151 lb 3.2 oz (68.6 kg)   SpO2 97%   BMI 25.16 kg/m   Wt Readings from Last 3 Encounters:  02/02/20 151 lb 3.2 oz (68.6 kg)  10/31/19 143 lb 4.8 oz (65 kg)  08/01/19 132 lb (59.9 kg)    Physical Exam Vitals and nursing note reviewed.  Constitutional:      General: He is not in acute distress.    Appearance: Normal appearance. He is obese.  Eyes:     General: No scleral icterus.    Extraocular Movements: Extraocular movements intact.  Cardiovascular:     Rate and Rhythm: Normal rate.     Heart sounds: Normal heart sounds. No murmur heard.   Pulmonary:     Effort: Pulmonary effort is normal. No respiratory distress.     Breath sounds: Normal breath sounds. No wheezing or rhonchi.  Abdominal:     General: Abdomen is flat. Bowel sounds are normal. There is no distension.  Musculoskeletal:     Cervical back: Normal range of motion.  Skin:    General: Skin is warm and dry.     Coloration: Skin is not jaundiced or pale.  Findings: No erythema.  Neurological:     Mental Status: He is alert. Mental status is at baseline.  Psychiatric:        Mood and Affect: Affect is inappropriate.        Speech: Speech is delayed.        Behavior: Behavior is not agitated. Behavior is cooperative.        Cognition and Memory: Memory normal.        Judgment: Judgment normal.        Assessment & Plan:   Problem List Items Addressed This Visit      Cardiovascular and Mediastinum   Benign essential hypertension    Chronic, ongoing.  Blood pressure above goal in office today, but is usually in the 120s over 80s at group home.  Will not make medication adjustments at this time, but continue to monitor blood pressure at group home daily.  Return to clinic if blood pressure maintaining over 140/90 on a regular basis.  CMP checked today.  Continue amlodipine 2.5 mg and lisinopril 20 mg daily.  Refills given.  Follow-up in 6 months.      Relevant Medications   lisinopril (ZESTRIL) 20  MG tablet   amLODipine (NORVASC) 2.5 MG tablet   Other Relevant Orders   Comprehensive metabolic panel     Respiratory   Perennial allergic rhinitis    Chronic, stable on current regimen.  Continue loratadine and montelukast.  Patient to return to clinic if symptoms persist despite current regimen.        Other   Vitamin D deficiency    Chronic, stable.  Last check showed vitamin D level of 80.  We will recheck today and discussed potentially decreasing dose to daily dose.      Relevant Orders   VITAMIN D 25 Hydroxy (Vit-D Deficiency, Fractures)   Schizophreniform disorder (HCC)    Chronic, stable per patient and caregiver, Annice Pih.  Continue to follow with psychiatry and per their recommendations.       Other Visit Diagnoses    Flu vaccine need    -  Primary   Relevant Orders   Flu Vaccine QUAD 36+ mos IM (Completed)       Follow up plan: Return in about 6 months (around 08/01/2020) for CPE with fasting labs.

## 2020-02-03 LAB — COMPREHENSIVE METABOLIC PANEL
ALT: 17 IU/L (ref 0–44)
AST: 18 IU/L (ref 0–40)
Albumin/Globulin Ratio: 1.6 (ref 1.2–2.2)
Albumin: 4.5 g/dL (ref 4.0–5.0)
Alkaline Phosphatase: 58 IU/L (ref 44–121)
BUN/Creatinine Ratio: 15 (ref 9–20)
BUN: 9 mg/dL (ref 6–20)
Bilirubin Total: 0.3 mg/dL (ref 0.0–1.2)
CO2: 24 mmol/L (ref 20–29)
Calcium: 9.7 mg/dL (ref 8.7–10.2)
Chloride: 104 mmol/L (ref 96–106)
Creatinine, Ser: 0.6 mg/dL — ABNORMAL LOW (ref 0.76–1.27)
GFR calc Af Amer: 154 mL/min/{1.73_m2} (ref >59)
GFR calc non Af Amer: 133 mL/min/{1.73_m2} (ref >59)
Globulin, Total: 2.8 g/dL (ref 1.5–4.5)
Glucose: 84 mg/dL (ref 65–99)
Potassium: 4.2 mmol/L (ref 3.5–5.2)
Sodium: 141 mmol/L (ref 134–144)
Total Protein: 7.3 g/dL (ref 6.0–8.5)

## 2020-02-03 LAB — VITAMIN D 25 HYDROXY (VIT D DEFICIENCY, FRACTURES): Vit D, 25-Hydroxy: 85.9 ng/mL (ref 30.0–100.0)

## 2020-02-24 ENCOUNTER — Other Ambulatory Visit: Payer: Self-pay

## 2020-02-24 ENCOUNTER — Telehealth: Payer: Self-pay

## 2020-02-24 ENCOUNTER — Telehealth: Payer: Self-pay | Admitting: Family Medicine

## 2020-02-24 MED ORDER — CALCIUM CARBONATE-VITAMIN D 500-200 MG-UNIT PO TABS: 1.0000 | ORAL_TABLET | Freq: Every day | ORAL | 2 refills | Status: AC

## 2020-02-24 NOTE — Telephone Encounter (Signed)
Copied from CRM [id number]. Topic: General - Inquiry >> Feb 24, 2020  9:19 AM Daphine Deutscher D wrote: Reason for CRM: Pt's mom called saying her son's Vit D levl was reduced and they ask her to get the OTC Vit D.  She states they have to have a rx sent to the pharmacy for insurance  [facility]  CB#  [phone]

## 2020-02-24 NOTE — Telephone Encounter (Signed)
Sent in Vit D Capsule to [facility] for pt.  CM

## 2020-02-26 ENCOUNTER — Telehealth: Payer: Self-pay

## 2020-02-26 NOTE — Telephone Encounter (Signed)
Please fax - I d/c'd Vitamin D 50,000 units and advised starting OTC Vitamin D3 600-800 IU daily after most recent labs.

## 2020-02-26 NOTE — Telephone Encounter (Signed)
Copied from CRM [id number]. Topic: General - Other >> Feb 26, 2020  1:17 PM Marylen Ponto wrote: Reason for CRM: Darrell Jewel requests dc order for patient's vitamin d 50,000 mg fax# [phone]

## 2020-02-27 NOTE — Telephone Encounter (Signed)
Order written. Will fax as soon as provider signs off.

## 2020-04-06 ENCOUNTER — Telehealth: Payer: Self-pay

## 2020-04-06 NOTE — Telephone Encounter (Signed)
Placed in folder for signature

## 2020-04-06 NOTE — Telephone Encounter (Signed)
Yes, that is fine. 

## 2020-04-06 NOTE — Telephone Encounter (Signed)
Routing to provider who last saw the patient. Is it ok to write and fax a D/C order for this medication?

## 2020-04-06 NOTE — Telephone Encounter (Signed)
Copied from CRM [id number]. Topic: Quick Communication - Rx Refill/Question >> Apr 06, 2020 12:46 PM Lyn Hollingshead D wrote: Pt mother call / A DC order need to place for this medication / diphenhydrAMINE (BENADRYL) 50 MG capsule [[id number]]  / please advise [facility] - [place], [place] - [address] [place] [address] Phone: [phone] Fax: [phone]

## 2020-04-27 ENCOUNTER — Encounter: Payer: Self-pay | Admitting: Oncology

## 2020-04-27 ENCOUNTER — Inpatient Hospital Stay (HOSPITAL_BASED_OUTPATIENT_CLINIC_OR_DEPARTMENT_OTHER): Payer: Medicare Other | Admitting: Oncology

## 2020-04-27 ENCOUNTER — Inpatient Hospital Stay: Payer: Medicare Other | Attending: Oncology

## 2020-04-27 VITALS — BP 152/89 | HR 98 | Temp 98.2°F | Resp 16 | Wt 157.1 lb

## 2020-04-27 DIAGNOSIS — R7989 Other specified abnormal findings of blood chemistry: Secondary | ICD-10-CM | POA: Diagnosis not present

## 2020-04-27 DIAGNOSIS — F840 Autistic disorder: Secondary | ICD-10-CM | POA: Diagnosis not present

## 2020-04-27 DIAGNOSIS — I10 Essential (primary) hypertension: Secondary | ICD-10-CM | POA: Insufficient documentation

## 2020-04-27 DIAGNOSIS — Z79899 Other long term (current) drug therapy: Secondary | ICD-10-CM | POA: Insufficient documentation

## 2020-04-27 LAB — FERRITIN: Ferritin: 160 ng/mL (ref 24–336)

## 2020-04-27 LAB — COMPREHENSIVE METABOLIC PANEL
ALT: 32 U/L (ref 0–44)
AST: 36 U/L (ref 15–41)
Albumin: 3.9 g/dL (ref 3.5–5.0)
Alkaline Phosphatase: 41 U/L (ref 38–126)
Anion gap: 6 (ref 5–15)
BUN: 13 mg/dL (ref 6–20)
CO2: 28 mmol/L (ref 22–32)
Calcium: 9.4 mg/dL (ref 8.9–10.3)
Chloride: 105 mmol/L (ref 98–111)
Creatinine, Ser: 0.81 mg/dL (ref 0.61–1.24)
GFR, Estimated: >60 mL/min (ref >60)
Glucose, Bld: 118 mg/dL — ABNORMAL HIGH (ref 70–99)
Potassium: 4 mmol/L (ref 3.5–5.1)
Sodium: 139 mmol/L (ref 135–145)
Total Bilirubin: 0.6 mg/dL (ref 0.3–1.2)
Total Protein: 7.3 g/dL (ref 6.5–8.1)

## 2020-04-27 LAB — CBC WITH DIFFERENTIAL/PLATELET
Abs Immature Granulocytes: 0.02 10*3/uL (ref 0.00–0.07)
Basophils Absolute: 0 10*3/uL (ref 0.0–0.1)
Basophils Relative: 1 %
Eosinophils Absolute: 0 10*3/uL (ref 0.0–0.5)
Eosinophils Relative: 1 %
HCT: 40.4 % (ref 39.0–52.0)
Hemoglobin: 14.3 g/dL (ref 13.0–17.0)
Immature Granulocytes: 0 %
Lymphocytes Relative: 34 %
Lymphs Abs: 2.2 10*3/uL (ref 0.7–4.0)
MCH: 32.5 pg (ref 26.0–34.0)
MCHC: 35.4 g/dL (ref 30.0–36.0)
MCV: 91.8 fL (ref 80.0–100.0)
Monocytes Absolute: 0.9 10*3/uL (ref 0.1–1.0)
Monocytes Relative: 14 %
Neutro Abs: 3.3 10*3/uL (ref 1.7–7.7)
Neutrophils Relative %: 50 %
Platelets: 192 10*3/uL (ref 150–400)
RBC: 4.4 MIL/uL (ref 4.22–5.81)
RDW: 13 % (ref 11.5–15.5)
WBC: 6.4 10*3/uL (ref 4.0–10.5)
nRBC: 0 % (ref 0.0–0.2)

## 2020-04-27 NOTE — Progress Notes (Signed)
Hematology/Oncology Consult note [facility]  [phone] [phone]  Patient Care Team: Volney American, PA-C as PCP - General (Family Medicine) Myer Haff, MD as Consulting Physician (Psychiatry) Sindy Guadeloupe, MD as Consulting Physician (Oncology)   Name of the patient: David Santos  [id number]  [birth year]   Date of visit: 04/27/20  Diagnosis-elevated ferritin likely reactive  Chief complaint/ Reason for visit-routine follow-up of elevated ferritin  Heme/Onc history: Patient is a 34 year old male with past medical history significant for schizophreniform disorder and autism spectrum disorder who is a resident of a group home.He was referred for high ferritin.Patient's most recent ferritin from 05/29/2019 was 535. His value before that in December was 383. Iron saturation has been normal in December at 36% elevated at 64% in January. HIV testing was negative. C-reactive protein was normal and ESR was normal. Looking back at his hemoglobin it was around 14 up until 2019 December and was mildly lower at 13.1 in December 2020. Patient is unable to give any history of his own  Interval history-patient is here with his caregiver from nursing home who reports that he is overall doing well and is at his baseline state of health.  Has not had any recent hospitalizations or infections.  Appetite and weight have remained stable  ECOG PS- 1 Pain scale- 0   Review of systems- Review of Systems  Constitutional: Negative for chills, fever, malaise/fatigue and weight loss.  HENT: Negative for congestion, ear discharge and nosebleeds.   Eyes: Negative for blurred vision.  Respiratory: Negative for cough, hemoptysis, sputum production, shortness of breath and wheezing.   Cardiovascular: Negative for chest pain, palpitations, orthopnea and claudication.  Gastrointestinal: Negative for abdominal pain, blood in stool, constipation,  diarrhea, heartburn, melena, nausea and vomiting.  Genitourinary: Negative for dysuria, flank pain, frequency, hematuria and urgency.  Musculoskeletal: Negative for back pain, joint pain and myalgias.  Skin: Negative for rash.  Neurological: Negative for dizziness, tingling, focal weakness, seizures, weakness and headaches.  Endo/Heme/Allergies: Does not bruise/bleed easily.  Psychiatric/Behavioral: Negative for depression and suicidal ideas. The patient does not have insomnia.       Allergies  Allergen Reactions  . Hydrochlorothiazide     Other reaction(s): Other (See Comments) Acute renal failure     Past Medical History:  Diagnosis Date  . Allergy   . Autism   . Hyperlipidemia   . Hypertension   . Psychosis ([place])   . Vitamin D deficiency      Past Surgical History:  Procedure Laterality Date  . TONSILLECTOMY AND ADENOIDECTOMY      Social History   Socioeconomic History  . Marital status: Single    Spouse name: Not on file  . Number of children: 0  . Years of education: Not on file  . Highest education level: Not on file  Occupational History  . Not on file  Tobacco Use  . Smoking status: Never Smoker  . Smokeless tobacco: Never Used  Vaping Use  . Vaping Use: Never used  Substance and Sexual Activity  . Alcohol use: No    Alcohol/week: 0.0 standard drinks  . Drug use: No  . Sexual activity: Never  Other Topics Concern  . Not on file  Social History Narrative   Lives in a group home - [place]    Social Determinants of Health   Financial Resource Strain: Not on file  Food Insecurity: Not on file  Transportation Needs: Not on  file  Physical Activity: Not on file  Stress: Not on file  Social Connections: Not on file  Intimate Partner Violence: Not on file    Family History  Problem Relation Age of Onset  . Hypertension Mother   . Graves' disease Mother   . Breast cancer Mother   . Mental illness Father      Current Outpatient  Medications:  .  amLODipine (NORVASC) 2.5 MG tablet, Take 1 tablet (2.5 mg total) by mouth daily., Disp: 90 tablet, Rfl: 1 .  calcium-vitamin D (OSCAL WITH D) 500-200 MG-UNIT tablet, Take 1 tablet by mouth daily with breakfast., Disp: 30 tablet, Rfl: 2 .  diazepam (VALIUM) 5 MG tablet, Take 5 mg by mouth as directed., Disp: , Rfl:  .  diphenhydrAMINE (BENADRYL) 50 MG capsule, Take 50 mg by mouth every 6 (six) hours as needed., Disp: , Rfl:  .  divalproex (DEPAKOTE ER) 500 MG 24 hr tablet, Take 500 mg by mouth at bedtime., Disp: , Rfl:  .  fenofibrate (TRICOR) 145 MG tablet, Take 145 mg by mouth daily., Disp: , Rfl:  .  haloperidol (HALDOL) 10 MG tablet, Take 10 mg by mouth 3 (three) times daily. , Disp: , Rfl:  .  lisinopril (ZESTRIL) 20 MG tablet, Take 1 tablet (20 mg total) by mouth daily., Disp: 90 tablet, Rfl: 1 .  loratadine (CLARITIN) 10 MG tablet, Take 1 tablet (10 mg total) by mouth daily., Disp: 31 tablet, Rfl: 10 .  montelukast (SINGULAIR) 10 MG tablet, Take 1 tablet (10 mg total) by mouth at bedtime., Disp: 30 tablet, Rfl: 5 .  risperiDONE (RISPERDAL) 3 MG tablet, Take 6 mg by mouth at bedtime. , Disp: , Rfl:   Physical exam:  Vitals:   04/27/20 0933  BP: (!) 152/89  Pulse: 98  Resp: 16  Temp: 98.2 F (36.8 C)  TempSrc: Tympanic  SpO2: 99%  Weight: 157 lb 1.6 oz (71.3 kg)   Physical Exam Constitutional:      General: He is not in acute distress. Eyes:     Extraocular Movements: EOM normal.  Cardiovascular:     Rate and Rhythm: Normal rate and regular rhythm.     Heart sounds: Normal heart sounds.  Pulmonary:     Effort: Pulmonary effort is normal.     Breath sounds: Normal breath sounds.  Abdominal:     General: Bowel sounds are normal.     Palpations: Abdomen is soft.  Skin:    General: Skin is warm and dry.  Neurological:     Mental Status: He is alert and oriented to person, place, and time.      CMP Latest Ref Rng & Units 04/27/2020  Glucose 70 - 99 mg/dL  118(H)  BUN 6 - 20 mg/dL 13  Creatinine 0.61 - 1.24 mg/dL 0.81  Sodium 135 - 145 mmol/L 139  Potassium 3.5 - 5.1 mmol/L 4.0  Chloride 98 - 111 mmol/L 105  CO2 22 - 32 mmol/L 28  Calcium 8.9 - 10.3 mg/dL 9.4  Total Protein 6.5 - 8.1 g/dL 7.3  Total Bilirubin 0.3 - 1.2 mg/dL 0.6  Alkaline Phos 38 - 126 U/L 41  AST 15 - 41 U/L 36  ALT 0 - 44 U/L 32   CBC Latest Ref Rng & Units 04/27/2020  WBC 4.0 - 10.5 K/uL 6.4  Hemoglobin 13.0 - 17.0 g/dL 14.3  Hematocrit 39.0 - 52.0 % 40.4  Platelets 150 - 400 K/uL 192     Assessment and  plan- Patient is a 34 y.o. male referred for elevated ferritin likely reactive  Patient CBC is normal today with a normal CMP as well. Ferritin levels now checked on 2 separate occasions have been normal at 198 and 160 respectively.  He therefore does not require any further follow-up with hematology at this time and can be referred to [place] in the future if questions or concerns arise   Visit Diagnosis 1. Elevated ferritin      Dr. Randa Evens, MD, MPH [facility] [phone] 04/27/2020 2:30 PM

## 2020-04-27 NOTE — Progress Notes (Signed)
Pt not having any problems

## 2020-05-05 ENCOUNTER — Other Ambulatory Visit: Payer: Self-pay

## 2020-05-05 ENCOUNTER — Encounter: Payer: Self-pay | Admitting: Nurse Practitioner

## 2020-05-05 ENCOUNTER — Ambulatory Visit (INDEPENDENT_AMBULATORY_CARE_PROVIDER_SITE_OTHER): Payer: Medicare Other | Admitting: Nurse Practitioner

## 2020-05-05 VITALS — BP 140/83 | HR 80 | Temp 98.4°F | Ht 64.61 in | Wt 159.6 lb

## 2020-05-05 DIAGNOSIS — Z0289 Encounter for other administrative examinations: Secondary | ICD-10-CM | POA: Diagnosis not present

## 2020-05-05 NOTE — Progress Notes (Signed)
BP 140/83    Pulse 80    Temp 98.4 F (36.9 C) (Oral)    Ht 5' 4.61" (1.641 m)    Wt 159 lb 9.6 oz (72.4 kg)    SpO2 97%    BMI 26.88 kg/m    Subjective:    Patient ID: David Santos, male    DOB: [birth year], 34 y.o.   MRN: [id number]  HPI: David Santos is a 34 y.o. male  Chief Complaint  Patient presents with   paperwork    FL2 paperwork   PAPERWORK: Presents today for [facility] paperwork to be completed. Patient currently resides at group home in [place].  Requires annual forms to be completed.  Labs currently up to date and reviewed, is followed by hematology for elevated ferritin.  Patient and caregiver deny any acute complaints today.  Relevant past medical, surgical, family and social history reviewed and updated as indicated. Interim medical history since our last visit reviewed. Allergies and medications reviewed and updated.  Review of Systems  Constitutional: Negative for activity change, diaphoresis, fatigue and fever.  Respiratory: Negative for cough, chest tightness, shortness of breath and wheezing.   Cardiovascular: Negative for chest pain, palpitations and leg swelling.  Gastrointestinal: Negative.   Endocrine: Negative.   Neurological: Negative.   Psychiatric/Behavioral: Negative.     Per HPI unless specifically indicated above     Objective:    BP 140/83    Pulse 80    Temp 98.4 F (36.9 C) (Oral)    Ht 5' 4.61" (1.641 m)    Wt 159 lb 9.6 oz (72.4 kg)    SpO2 97%    BMI 26.88 kg/m   Wt Readings from Last 3 Encounters:  05/05/20 159 lb 9.6 oz (72.4 kg)  04/27/20 157 lb 1.6 oz (71.3 kg)  02/02/20 151 lb 3.2 oz (68.6 kg)    Physical Exam Vitals and nursing note reviewed.  Constitutional:      General: He is awake. He is not in acute distress.    Appearance: Normal appearance. He is well-groomed and overweight.  Eyes:     General: No scleral icterus.    Extraocular Movements: Extraocular movements intact.  Cardiovascular:     Rate and Rhythm:  Normal rate.     Heart sounds: Normal heart sounds. No murmur heard.   Pulmonary:     Effort: Pulmonary effort is normal. No respiratory distress.     Breath sounds: Normal breath sounds. No wheezing or rhonchi.  Abdominal:     General: Abdomen is flat. Bowel sounds are normal. There is no distension.  Musculoskeletal:     Cervical back: Normal range of motion.  Skin:    General: Skin is warm and dry.     Coloration: Skin is not jaundiced or pale.     Findings: No erythema.  Neurological:     Mental Status: He is alert. Mental status is at baseline.  Psychiatric:        Mood and Affect: Affect is inappropriate.        Speech: Speech is delayed.        Behavior: Behavior is not agitated. Behavior is cooperative.        Cognition and Memory: Memory normal.        Judgment: Judgment normal.     Results for orders placed or performed in visit on 04/27/20  Ferritin  Result Value Ref Range   Ferritin 160 24 - 336 ng/mL  Comprehensive metabolic panel  Result Value Ref Range   Sodium 139 135 - 145 mmol/L   Potassium 4.0 3.5 - 5.1 mmol/L   Chloride 105 98 - 111 mmol/L   CO2 28 22 - 32 mmol/L   Glucose, Bld 118 (H) 70 - 99 mg/dL   BUN 13 6 - 20 mg/dL   Creatinine, Ser 5.42 0.61 - 1.24 mg/dL   Calcium 9.4 8.9 - 70.6 mg/dL   Total Protein 7.3 6.5 - 8.1 g/dL   Albumin 3.9 3.5 - 5.0 g/dL   AST 36 15 - 41 U/L   ALT 32 0 - 44 U/L   Alkaline Phosphatase 41 38 - 126 U/L   Total Bilirubin 0.6 0.3 - 1.2 mg/dL   GFR, Estimated >23 >76 mL/min   Anion gap 6 5 - 15  CBC with Differential/Platelet  Result Value Ref Range   WBC 6.4 4.0 - 10.5 K/uL   RBC 4.40 4.22 - 5.81 MIL/uL   Hemoglobin 14.3 13.0 - 17.0 g/dL   HCT 28.3 15.1 - 76.1 %   MCV 91.8 80.0 - 100.0 fL   MCH 32.5 26.0 - 34.0 pg   MCHC 35.4 30.0 - 36.0 g/dL   RDW 60.7 37.1 - 06.2 %   Platelets 192 150 - 400 K/uL   nRBC 0.0 0.0 - 0.2 %   Neutrophils Relative % 50 %   Neutro Abs 3.3 1.7 - 7.7 K/uL   Lymphocytes Relative 34  %   Lymphs Abs 2.2 0.7 - 4.0 K/uL   Monocytes Relative 14 %   Monocytes Absolute 0.9 0.1 - 1.0 K/uL   Eosinophils Relative 1 %   Eosinophils Absolute 0.0 0.0 - 0.5 K/uL   Basophils Relative 1 %   Basophils Absolute 0.0 0.0 - 0.1 K/uL   Immature Granulocytes 0 %   Abs Immature Granulocytes 0.02 0.00 - 0.07 K/uL      Assessment & Plan:   Problem List Items Addressed This Visit   None   Visit Diagnoses    Encounter for completion of form with patient    -  Primary   FL2 forms completed today.  Labs currently up to date and no acute concerns today.       Follow up plan: Return in about 6 months (around 11/03/2020) for Chronic disease visit with new PCP.

## 2020-05-05 NOTE — Patient Instructions (Signed)
Vitamin D Deficiency Vitamin D deficiency is when your body does not have enough vitamin D. Vitamin D is important to your body because:  It helps your body use other minerals.  It helps to keep your bones strong and healthy.  It may help to prevent some diseases.  It helps your heart and other muscles work well. Not getting enough vitamin D can make your bones soft. It can also cause other health problems. What are the causes? This condition may be caused by:  Not eating enough foods that contain vitamin D.  Not getting enough sun.  Having diseases that make it hard for your body to absorb vitamin D.  Having a surgery in which a part of the stomach or a part of the small intestine is removed.  Having kidney disease or liver disease. What increases the risk? You are more likely to get this condition if:  You are older.  You do not spend much time outdoors.  You live in a nursing home.  You have had broken bones.  You have weak or thin bones (osteoporosis).  You have a disease or condition that changes how your body absorbs vitamin D.  You have dark skin.  You take certain medicines.  You are overweight or obese. What are the signs or symptoms?  In mild cases, there may not be any symptoms. If the condition is very bad, symptoms may include: ? Bone pain. ? Muscle pain. ? Falling often. ? Broken bones caused by a minor injury. How is this treated? Treatment may include taking supplements as told by your doctor. Your doctor will tell you what dose is best for you. Supplements may include:  Vitamin D.  Calcium. Follow these instructions at home: Eating and drinking   Eat foods that contain vitamin D, such as: ? Dairy products, cereals, or juices with added vitamin D. Check the label. ? Fish, such as salmon or trout. ? Eggs. ? Oysters. ? Mushrooms. The items listed above may not be a complete list of what you can eat and drink. Contact a dietitian for more  options. General instructions  Take medicines and supplements only as told by your doctor.  Get regular, safe exposure to natural sunlight.  Do not use a tanning bed.  Maintain a healthy weight. Lose weight if needed.  Keep all follow-up visits as told by your doctor. This is important. How is this prevented?  You can get vitamin D by: ? Eating foods that naturally contain vitamin D. ? Eating or drinking products that have vitamin D added to them, such as cereals, juices, and milk. ? Taking vitamin D or a multivitamin that contains vitamin D. ? Being in the sun. Your body makes vitamin D when your skin is exposed to sunlight. Your body changes the sunlight into a form of the vitamin that it can use. Contact a doctor if:  Your symptoms do not go away.  You feel sick to your stomach (nauseous).  You throw up (vomit).  You poop less often than normal, or you have trouble pooping (constipation). Summary  Vitamin D deficiency is when your body does not have enough vitamin D.  Vitamin D helps to keep your bones strong and healthy.  This condition is often treated by taking a supplement.  Your doctor will tell you what dose is best for you. This information is not intended to replace advice given to you by your health care provider. Make sure you discuss any questions you   have with your health care provider. Document Revised: 12/31/2017 Document Reviewed: 12/31/2017 Elsevier Patient Education  2020 [employer].  

## 2020-05-25 ENCOUNTER — Other Ambulatory Visit: Payer: Self-pay | Admitting: Family Medicine

## 2020-05-25 DIAGNOSIS — J3089 Other allergic rhinitis: Secondary | ICD-10-CM

## 2020-07-09 ENCOUNTER — Other Ambulatory Visit: Payer: Self-pay | Admitting: Nurse Practitioner

## 2020-07-09 ENCOUNTER — Telehealth: Payer: Self-pay | Admitting: *Deleted

## 2020-07-09 NOTE — Telephone Encounter (Signed)
David Santos brought in a FL2 form for [employer] for 2 medications (Diazepam 5mg  and Diphenhydramine 50 mg) to be DC by drawing a line through and initial please and he wants a call to come back and pick it up ([phone])  placed in BIN for Review

## 2020-07-09 NOTE — Telephone Encounter (Signed)
Placed in your folder for review. 

## 2020-07-09 NOTE — Telephone Encounter (Signed)
Your patient, is scheduled with me 08/03/20, have not met.  [employer] patient.

## 2020-07-09 NOTE — Telephone Encounter (Signed)
You have already signed the form from your visit in December with him.  You just have to D/C the meds that they are requesting.

## 2020-07-09 NOTE — Telephone Encounter (Signed)
Thank you, missed that.  Will sign off.

## 2020-08-03 ENCOUNTER — Ambulatory Visit (INDEPENDENT_AMBULATORY_CARE_PROVIDER_SITE_OTHER): Payer: Medicare Other | Admitting: Nurse Practitioner

## 2020-08-03 ENCOUNTER — Other Ambulatory Visit: Payer: Self-pay

## 2020-08-03 ENCOUNTER — Encounter: Payer: Self-pay | Admitting: Nurse Practitioner

## 2020-08-03 VITALS — BP 130/79 | HR 93 | Temp 98.4°F | Ht 65.0 in | Wt 161.2 lb

## 2020-08-03 DIAGNOSIS — E559 Vitamin D deficiency, unspecified: Secondary | ICD-10-CM

## 2020-08-03 DIAGNOSIS — F2081 Schizophreniform disorder: Secondary | ICD-10-CM

## 2020-08-03 DIAGNOSIS — Z0000 Encounter for general adult medical examination without abnormal findings: Secondary | ICD-10-CM

## 2020-08-03 DIAGNOSIS — E785 Hyperlipidemia, unspecified: Secondary | ICD-10-CM | POA: Diagnosis not present

## 2020-08-03 DIAGNOSIS — J3089 Other allergic rhinitis: Secondary | ICD-10-CM

## 2020-08-03 DIAGNOSIS — I10 Essential (primary) hypertension: Secondary | ICD-10-CM | POA: Diagnosis not present

## 2020-08-03 DIAGNOSIS — R7989 Other specified abnormal findings of blood chemistry: Secondary | ICD-10-CM | POA: Diagnosis not present

## 2020-08-03 DIAGNOSIS — E8881 Metabolic syndrome: Secondary | ICD-10-CM

## 2020-08-03 DIAGNOSIS — R739 Hyperglycemia, unspecified: Secondary | ICD-10-CM | POA: Diagnosis not present

## 2020-08-03 LAB — BAYER DCA HB A1C WAIVED: HB A1C (BAYER DCA - WAIVED): 5.4 % (ref <7.0)

## 2020-08-03 MED ORDER — LISINOPRIL 20 MG PO TABS: 20.0000 mg | ORAL_TABLET | Freq: Every day | ORAL | 1 refills | Status: DC

## 2020-08-03 MED ORDER — AMLODIPINE BESYLATE 2.5 MG PO TABS: 2.5000 mg | ORAL_TABLET | Freq: Every day | ORAL | 1 refills | Status: DC

## 2020-08-03 NOTE — Assessment & Plan Note (Signed)
Chronic, stable. Followed by psychiatry. Continue current regimen.

## 2020-08-03 NOTE — Assessment & Plan Note (Signed)
Chronic. BMI today 26.83. Weight is stable. A1C today is 5.4. Discussed diet and exercise.

## 2020-08-03 NOTE — Assessment & Plan Note (Signed)
Chronic, stable. Continue current regimen. Check vitamin D today.

## 2020-08-03 NOTE — Assessment & Plan Note (Signed)
Chronic, stable. Continue current regimen. Check lipid panel today. Follow-up in 6 months. 

## 2020-08-03 NOTE — Assessment & Plan Note (Addendum)
Chronic, stable. BP 130/79 today, well controlled. Continue current regimen. Refills sent to the pharmacy. Check CMP, CBC today. Follow-up in 6 months.

## 2020-08-03 NOTE — Assessment & Plan Note (Signed)
Chronic, stable. Continue current regimen. 

## 2020-08-03 NOTE — Progress Notes (Signed)
BP 130/79   Pulse 93   Temp 98.4 F (36.9 C) (Oral)   Ht _0  (1.651 m)   Wt 161 lb 3.2 oz (73.1 kg)   SpO2 98%   BMI 26.83 kg/m    Subjective:    Patient ID: David Santos, male    DOB: 11/03/1985, 35 y.o.   MRN: [id number]   Chief Complaint  Patient presents with  . Annual Exam    HPI: KRIS NO is a 35 y.o. male presenting on 08/03/2020 for comprehensive medical examination. Current medical complaints include:none   He currently lives with: Group home in [place] Interim Problems from his last visit: no  Depression Screen done today and results listed below:  Depression screen [facility] 2/9 08/03/2020 05/05/2020 08/01/2019 07/02/2019 05/01/2019  Decreased Interest 0 0 0 0 0  Down, Depressed, Hopeless 0 0 0 0 0  PHQ - 2 Score 0 0 0 0 0  Altered sleeping 0 - 0 0 0  Tired, decreased energy 0 - 0 0 0  Change in appetite 0 - 0 0 0  Feeling bad or failure about yourself  0 - 0 0 0  Trouble concentrating 0 - 0 0 0  Moving slowly or fidgety/restless 0 - 0 0 0  Suicidal thoughts 0 - 0 0 0  PHQ-9 Score 0 - 0 0 0  Difficult doing work/chores - - - Not difficult at all -    The patient does not have a history of falls. I did not complete a risk assessment for falls. A plan of care for falls was not documented.   Past Medical History:  Past Medical History:  Diagnosis Date  . Allergy   . Autism   . Hyperlipidemia   . Hypertension   . Psychosis ([place])   . Vitamin D deficiency     Surgical History:  Past Surgical History:  Procedure Laterality Date  . TONSILLECTOMY AND ADENOIDECTOMY      Medications:  Current Outpatient Medications on File Prior to Visit  Medication Sig  . ALLERGY RELIEF 10 MG tablet TAKE ONE TABLET BY MOUTH EVERY DAY  . calcium-vitamin D (OSCAL WITH D) 500-200 MG-UNIT tablet Take 1 tablet by mouth daily with breakfast.  . divalproex (DEPAKOTE ER) 500 MG 24 hr tablet Take 500 mg by mouth at bedtime.  . fenofibrate (TRICOR) 145 MG tablet Take  145 mg by mouth daily.  . haloperidol (HALDOL) 10 MG tablet Take 10 mg by mouth 3 (three) times daily.   Marland Kitchen loratadine (CLARITIN) 10 MG tablet Take 10 mg by mouth daily.  . montelukast (SINGULAIR) 10 MG tablet TAKE ONE TABLET BY MOUTH AT BEDTIME  . risperiDONE (RISPERDAL) 3 MG tablet Take 6 mg by mouth at bedtime.    No current facility-administered medications on file prior to visit.    Allergies:  Allergies  Allergen Reactions  . Hydrochlorothiazide     Other reaction(s): Other (See Comments) Acute renal failure    Social History:  Social History   Socioeconomic History  . Marital status: Single    Spouse name: Not on file  . Number of children: 0  . Years of education: Not on file  . Highest education level: Not on file  Occupational History  . Not on file  Tobacco Use  . Smoking status: Never Smoker  . Smokeless tobacco: Never Used  Vaping Use  . Vaping Use: Never used  Substance and Sexual Activity  . Alcohol use: No  Alcohol/week: 0.0 standard drinks  . Drug use: No  . Sexual activity: Never  Other Topics Concern  . Not on file  Social History Narrative   Lives in a group home - [place]    Social Determinants of Health   Financial Resource Strain: Not on file  Food Insecurity: Not on file  Transportation Needs: Not on file  Physical Activity: Not on file  Stress: Not on file  Social Connections: Not on file  Intimate Partner Violence: Not on file   Social History   Tobacco Use  Smoking Status Never Smoker  Smokeless Tobacco Never Used   Social History   Substance and Sexual Activity  Alcohol Use No  . Alcohol/week: 0.0 standard drinks    Family History:  Family History  Problem Relation Age of Onset  . Hypertension Mother   . Graves' disease Mother   . Breast cancer Mother   . Mental illness Father     Past medical history, surgical history, medications, allergies, family history and social history reviewed with patient  today and changes made to appropriate areas of the chart.   Review of Systems  Constitutional: Negative for malaise/fatigue.  HENT: Negative.   Eyes: Negative.   Respiratory: Negative.   Cardiovascular: Negative.   Gastrointestinal: Positive for constipation.  Genitourinary: Negative.   Musculoskeletal: Negative.   Skin: Negative.   Neurological: Negative.   Endo/Heme/Allergies: Positive for environmental allergies.  Psychiatric/Behavioral: Negative.    All other ROS negative except what is listed above and in the HPI.      Objective:    BP 130/79   Pulse 93   Temp 98.4 F (36.9 C) (Oral)   Ht _0  (1.651 m)   Wt 161 lb 3.2 oz (73.1 kg)   SpO2 98%   BMI 26.83 kg/m   Wt Readings from Last 3 Encounters:  08/03/20 161 lb 3.2 oz (73.1 kg)  05/05/20 159 lb 9.6 oz (72.4 kg)  04/27/20 157 lb 1.6 oz (71.3 kg)    Physical Exam Vitals and nursing note reviewed.  Constitutional:      General: He is not in acute distress.    Appearance: Normal appearance.  HENT:     Head: Normocephalic and atraumatic.     Right Ear: Tympanic membrane, ear canal and external ear normal.     Left Ear: Tympanic membrane, ear canal and external ear normal.     Nose: Nose normal.     Mouth/Throat:     Mouth: Mucous membranes are moist.     Pharynx: Oropharynx is clear.  Eyes:     Conjunctiva/sclera: Conjunctivae normal.  Cardiovascular:     Rate and Rhythm: Normal rate and regular rhythm.     Pulses: Normal pulses.     Heart sounds: Normal heart sounds.  Pulmonary:     Effort: Pulmonary effort is normal.     Breath sounds: Normal breath sounds.  Abdominal:     General: Bowel sounds are normal.     Palpations: Abdomen is soft.     Tenderness: There is no abdominal tenderness.  Musculoskeletal:        General: Normal range of motion.     Cervical back: Normal range of motion and neck supple.     Right lower leg: No edema.     Left lower leg: No edema.  Lymphadenopathy:     Cervical:  No cervical adenopathy.  Skin:    General: Skin is warm and dry.  Neurological:  General: No focal deficit present.     Mental Status: He is alert and oriented to person, place, and time.     Cranial Nerves: No cranial nerve deficit.     Gait: Gait normal.     Deep Tendon Reflexes: Reflexes normal.  Psychiatric:        Mood and Affect: Affect is flat.        Speech: Speech is delayed.        Behavior: Behavior is not agitated. Behavior is cooperative.        Thought Content: Thought content normal.     Results for orders placed or performed in visit on 04/27/20  Ferritin  Result Value Ref Range   Ferritin 160 24 - 336 ng/mL  Comprehensive metabolic panel  Result Value Ref Range   Sodium 139 135 - 145 mmol/L   Potassium 4.0 3.5 - 5.1 mmol/L   Chloride 105 98 - 111 mmol/L   CO2 28 22 - 32 mmol/L   Glucose, Bld 118 (H) 70 - 99 mg/dL   BUN 13 6 - 20 mg/dL   Creatinine, Ser 0.81 0.61 - 1.24 mg/dL   Calcium 9.4 8.9 - 10.3 mg/dL   Total Protein 7.3 6.5 - 8.1 g/dL   Albumin 3.9 3.5 - 5.0 g/dL   AST 36 15 - 41 U/L   ALT 32 0 - 44 U/L   Alkaline Phosphatase 41 38 - 126 U/L   Total Bilirubin 0.6 0.3 - 1.2 mg/dL   GFR, Estimated >60 >60 mL/min   Anion gap 6 5 - 15  CBC with Differential/Platelet  Result Value Ref Range   WBC 6.4 4.0 - 10.5 K/uL   RBC 4.40 4.22 - 5.81 MIL/uL   Hemoglobin 14.3 13.0 - 17.0 g/dL   HCT 40.4 39.0 - 52.0 %   MCV 91.8 80.0 - 100.0 fL   MCH 32.5 26.0 - 34.0 pg   MCHC 35.4 30.0 - 36.0 g/dL   RDW 13.0 11.5 - 15.5 %   Platelets 192 150 - 400 K/uL   nRBC 0.0 0.0 - 0.2 %   Neutrophils Relative % 50 %   Neutro Abs 3.3 1.7 - 7.7 K/uL   Lymphocytes Relative 34 %   Lymphs Abs 2.2 0.7 - 4.0 K/uL   Monocytes Relative 14 %   Monocytes Absolute 0.9 0.1 - 1.0 K/uL   Eosinophils Relative 1 %   Eosinophils Absolute 0.0 0.0 - 0.5 K/uL   Basophils Relative 1 %   Basophils Absolute 0.0 0.0 - 0.1 K/uL   Immature Granulocytes 0 %   Abs Immature Granulocytes  0.02 0.00 - 0.07 K/uL      Assessment & Plan:   Problem List Items Addressed This Visit      Cardiovascular and Mediastinum   Benign essential hypertension - Primary    Chronic, stable. BP 130/79 today, well controlled. Continue current regimen. Refills sent to the pharmacy. Check CMP, CBC today. Follow-up in 6 months.      Relevant Medications   amLODipine (NORVASC) 2.5 MG tablet   lisinopril (ZESTRIL) 20 MG tablet   Other Relevant Orders   Comp Met (CMET)   CBC w/Diff     Respiratory   Perennial allergic rhinitis    Chronic, stable. Continue current regimen.         Other   Dysmetabolic syndrome    Chronic. BMI today 26.83. Weight is stable. A1C today is 5.4. Discussed diet and exercise.  Relevant Orders   Comp Met (CMET)   Bayer DCA Hb A1c Waived   Vitamin D deficiency    Chronic, stable. Continue current regimen. Check vitamin D today.      Relevant Orders   Vitamin D (25 hydroxy)   Dyslipidemia    Chronic, stable. Continue current regimen. Check lipid panel today. Follow-up in 6 months.      Relevant Orders   Lipid Panel w/o Chol/HDL Ratio   Schizophreniform disorder (HCC)    Chronic, stable. Followed by psychiatry. Continue current regimen.        Other Visit Diagnoses    Routine general medical examination at a health care facility       Health maintenance reviewed, up to date on immunizations and screenings. Check CMP, CBC, TSH    Relevant Orders   Comp Met (CMET)   CBC w/Diff   TSH   Lipid Panel w/o Chol/HDL Ratio   High serum ferritin       Saw hemetology, had 2 normal ferritins and will just continue to monitor. Check ferritin today.   Relevant Orders   Ferritin       Discussed aspirin prophylaxis for myocardial infarction prevention and decision was it was not indicated  LABORATORY TESTING:  Health maintenance labs ordered today as discussed above.   The natural history of prostate cancer and ongoing controversy regarding  screening and potential treatment outcomes of prostate cancer has been discussed with the patient. The meaning of a false positive PSA and a false negative PSA has been discussed. He indicates understanding of the limitations of this screening test and wishes not to proceed with screening PSA testing.   IMMUNIZATIONS:   - Tdap: Tetanus vaccination status reviewed: last tetanus booster within 10 years. - Influenza: Up to date - Pneumovax: Not applicable - Prevnar: Not applicable - HPV: Not applicable - Zostavax vaccine: Not applicable  SCREENING: - Colonoscopy: Not applicable  Discussed with patient purpose of the colonoscopy is to detect colon cancer at curable precancerous or early stages   - AAA Screening: Not applicable  -Hearing Test: Not applicable  -Spirometry: Not applicable   PATIENT COUNSELING:    Sexuality: Discussed sexually transmitted diseases, partner selection, use of condoms, avoidance of unintended pregnancy  and contraceptive alternatives.   Advised to avoid cigarette smoking.  I discussed with the patient that most people either abstain from alcohol or drink within safe limits (<=14/week and <=4 drinks/occasion for males, <=7/weeks and <= 3 drinks/occasion for females) and that the risk for alcohol disorders and other health effects rises proportionally with the number of drinks per week and how often a drinker exceeds daily limits.  Discussed cessation/primary prevention of drug use and availability of treatment for abuse.   Diet: Encouraged to adjust caloric intake to maintain  or achieve ideal body weight, to reduce intake of dietary saturated fat and total fat, to limit sodium intake by avoiding high sodium foods and not adding table salt, and to maintain adequate dietary potassium and calcium preferably from fresh fruits, vegetables, and low-fat dairy products.    stressed the importance of regular exercise  Injury prevention: Discussed safety belts, safety  helmets, smoke detector, smoking near bedding or upholstery.   Dental health: Discussed importance of regular tooth brushing, flossing, and dental visits.   Follow up plan: NEXT PREVENTATIVE PHYSICAL DUE IN 1 YEAR. Return in about 6 months (around 02/03/2021) for htn, hld.

## 2020-08-03 NOTE — Patient Instructions (Signed)

## 2020-08-04 LAB — COMPREHENSIVE METABOLIC PANEL
ALT: 19 IU/L (ref 0–44)
AST: 21 IU/L (ref 0–40)
Albumin/Globulin Ratio: 1.5 (ref 1.2–2.2)
Albumin: 4.1 g/dL (ref 4.0–5.0)
Alkaline Phosphatase: 46 IU/L (ref 44–121)
BUN/Creatinine Ratio: 10 (ref 9–20)
BUN: 7 mg/dL (ref 6–20)
Bilirubin Total: 0.3 mg/dL (ref 0.0–1.2)
CO2: 23 mmol/L (ref 20–29)
Calcium: 9.3 mg/dL (ref 8.7–10.2)
Chloride: 106 mmol/L (ref 96–106)
Creatinine, Ser: 0.69 mg/dL — ABNORMAL LOW (ref 0.76–1.27)
Globulin, Total: 2.8 g/dL (ref 1.5–4.5)
Glucose: 109 mg/dL — ABNORMAL HIGH (ref 65–99)
Potassium: 4.2 mmol/L (ref 3.5–5.2)
Sodium: 142 mmol/L (ref 134–144)
Total Protein: 6.9 g/dL (ref 6.0–8.5)
eGFR: 126 mL/min/{1.73_m2} (ref >59)

## 2020-08-04 LAB — CBC WITH DIFFERENTIAL/PLATELET
Basophils Absolute: 0 10*3/uL (ref 0.0–0.2)
Basos: 1 %
EOS (ABSOLUTE): 0.1 10*3/uL (ref 0.0–0.4)
Eos: 2 %
Hematocrit: 39.2 % (ref 37.5–51.0)
Hemoglobin: 13.4 g/dL (ref 13.0–17.7)
Immature Grans (Abs): 0 10*3/uL (ref 0.0–0.1)
Immature Granulocytes: 0 %
Lymphocytes Absolute: 1.9 10*3/uL (ref 0.7–3.1)
Lymphs: 37 %
MCH: 31.5 pg (ref 26.6–33.0)
MCHC: 34.2 g/dL (ref 31.5–35.7)
MCV: 92 fL (ref 79–97)
Monocytes Absolute: 0.7 10*3/uL (ref 0.1–0.9)
Monocytes: 12 %
Neutrophils Absolute: 2.5 10*3/uL (ref 1.4–7.0)
Neutrophils: 48 %
Platelets: 201 10*3/uL (ref 150–450)
RBC: 4.25 x10E6/uL (ref 4.14–5.80)
RDW: 13.5 % (ref 11.6–15.4)
WBC: 5.3 10*3/uL (ref 3.4–10.8)

## 2020-08-04 LAB — LIPID PANEL W/O CHOL/HDL RATIO
Cholesterol, Total: 153 mg/dL (ref 100–199)
HDL: 38 mg/dL — ABNORMAL LOW (ref >39)
LDL Chol Calc (NIH): 89 mg/dL (ref 0–99)
Triglycerides: 149 mg/dL (ref 0–149)
VLDL Cholesterol Cal: 26 mg/dL (ref 5–40)

## 2020-08-04 LAB — TSH: TSH: 1.31 u[IU]/mL (ref 0.450–4.500)

## 2020-08-04 LAB — FERRITIN: Ferritin: 396 ng/mL (ref 30–400)

## 2020-08-04 LAB — VITAMIN D 25 HYDROXY (VIT D DEFICIENCY, FRACTURES): Vit D, 25-Hydroxy: 43.1 ng/mL (ref 30.0–100.0)

## 2020-12-10 ENCOUNTER — Other Ambulatory Visit: Payer: Self-pay

## 2020-12-10 ENCOUNTER — Encounter: Payer: Self-pay | Admitting: Internal Medicine

## 2020-12-10 ENCOUNTER — Ambulatory Visit (INDEPENDENT_AMBULATORY_CARE_PROVIDER_SITE_OTHER): Payer: Medicare Other | Admitting: Internal Medicine

## 2020-12-10 VITALS — BP 137/89 | HR 112 | Temp 99.3°F | Ht 64.96 in | Wt 162.4 lb

## 2020-12-10 DIAGNOSIS — G4483 Primary cough headache: Secondary | ICD-10-CM | POA: Diagnosis not present

## 2020-12-10 DIAGNOSIS — R0989 Other specified symptoms and signs involving the circulatory and respiratory systems: Secondary | ICD-10-CM

## 2020-12-10 LAB — VERITOR FLU A/B WAIVED
Influenza A: NEGATIVE
Influenza B: NEGATIVE

## 2020-12-10 MED ORDER — AZITHROMYCIN 250 MG PO TABS: ORAL_TABLET | ORAL | 0 refills | Status: AC

## 2020-12-10 MED ORDER — FEXOFENADINE HCL 180 MG PO TABS: 180.0000 mg | ORAL_TABLET | Freq: Every day | ORAL | 1 refills | Status: DC

## 2020-12-10 NOTE — Progress Notes (Signed)
Please let pt know this was normal.

## 2020-12-10 NOTE — Progress Notes (Signed)
  BP 137/89   Pulse (!) 112   Temp 99.3 F (37.4 C) (Oral)   Ht 5' 4.96" (1.65 m)   Wt 162 lb 6.4 oz (73.7 kg)   SpO2 96%   BMI 27.06 kg/m    Subjective:    Patient ID: David Santos, male    DOB: 08/31/1985, 35 y.o.   MRN: [id number]  Chief Complaint  Patient presents with  . sinus pressure    Al symptoms started yesterday.  . Headache  . Cough  . Nasal Congestion    HPI: David Santos is a 35 y.o. male  Sinus Problem This is a new problem. The current episode started in the past 7 days. There has been no fever. The fever has been present for 3 to 4 days. His pain is at a severity of 5/10. The pain is mild. Associated symptoms include sinus pressure. Pertinent negatives include no chills, congestion, coughing, diaphoresis, ear pain, headaches, hoarse voice, neck pain, shortness of breath, sneezing, sore throat or swollen glands.   Chief Complaint  Patient presents with  . sinus pressure    Al symptoms started yesterday.  . Headache  . Cough  . Nasal Congestion    Relevant past medical, surgical, family and social history reviewed and updated as indicated. Interim medical history since our last visit reviewed. Allergies and medications reviewed and updated.  Review of Systems  Constitutional:  Negative for chills and diaphoresis.  HENT:  Positive for sinus pressure. Negative for congestion, ear pain, hoarse voice, sneezing and sore throat.   Respiratory:  Negative for cough and shortness of breath.   Musculoskeletal:  Negative for neck pain.  Neurological:  Negative for headaches.   Per HPI unless specifically indicated above     Objective:    BP 137/89   Pulse (!) 112   Temp 99.3 F (37.4 C) (Oral)   Ht 5' 4.96" (1.65 m)   Wt 162 lb 6.4 oz (73.7 kg)   SpO2 96%   BMI 27.06 kg/m   Wt Readings from Last 3 Encounters:  12/10/20 162 lb 6.4 oz (73.7 kg)  08/03/20 161 lb 3.2 oz (73.1 kg)  05/05/20 159 lb 9.6 oz (72.4 kg)    Physical Exam Vitals  and nursing note reviewed.  Constitutional:      General: He is not in acute distress.    Appearance: Normal appearance. He is not ill-appearing or diaphoretic.  HENT:     Head: Normocephalic and atraumatic.     Right Ear: Tympanic membrane and external ear normal. There is no impacted cerumen.     Left Ear: External ear normal.     Nose: No congestion or rhinorrhea.     Mouth/Throat:     Pharynx: No oropharyngeal exudate or posterior oropharyngeal erythema.  Eyes:     Conjunctiva/sclera: Conjunctivae normal.     Pupils: Pupils are equal, round, and reactive to light.  Pulmonary:     Effort: No respiratory distress.     Breath sounds: No wheezing.  Chest:     Chest wall: No tenderness.  Abdominal:     General: Abdomen is flat.  Musculoskeletal:     Cervical back: Normal range of motion and neck supple. No rigidity or tenderness.     Left lower leg: No edema.  Neurological:     Mental Status: He is alert.   Results for orders placed or performed in visit on 08/03/20  Comp Met (CMET)  Result Value Ref Range     Glucose 109 (H) 65 - 99 mg/dL   BUN 7 6 - 20 mg/dL   Creatinine, Ser 0.69 (L) 0.76 - 1.27 mg/dL   eGFR 126 >59 mL/min/1.73   BUN/Creatinine Ratio 10 9 - 20   Sodium 142 134 - 144 mmol/L   Potassium 4.2 3.5 - 5.2 mmol/L   Chloride 106 96 - 106 mmol/L   CO2 23 20 - 29 mmol/L   Calcium 9.3 8.7 - 10.2 mg/dL   Total Protein 6.9 6.0 - 8.5 g/dL   Albumin 4.1 4.0 - 5.0 g/dL   Globulin, Total 2.8 1.5 - 4.5 g/dL   Albumin/Globulin Ratio 1.5 1.2 - 2.2   Bilirubin Total 0.3 0.0 - 1.2 mg/dL   Alkaline Phosphatase 46 44 - 121 IU/L   AST 21 0 - 40 IU/L   ALT 19 0 - 44 IU/L  CBC w/Diff  Result Value Ref Range   WBC 5.3 3.4 - 10.8 x10E3/uL   RBC 4.25 4.14 - 5.80 x10E6/uL   Hemoglobin 13.4 13.0 - 17.7 g/dL   Hematocrit 39.2 37.5 - 51.0 %   MCV 92 79 - 97 fL   MCH 31.5 26.6 - 33.0 pg   MCHC 34.2 31.5 - 35.7 g/dL   RDW 13.5 11.6 - 15.4 %   Platelets 201 150 - 450 x10E3/uL    Neutrophils 48 Not Estab. %   Lymphs 37 Not Estab. %   Monocytes 12 Not Estab. %   Eos 2 Not Estab. %   Basos 1 Not Estab. %   Neutrophils Absolute 2.5 1.4 - 7.0 x10E3/uL   Lymphocytes Absolute 1.9 0.7 - 3.1 x10E3/uL   Monocytes Absolute 0.7 0.1 - 0.9 x10E3/uL   EOS (ABSOLUTE) 0.1 0.0 - 0.4 x10E3/uL   Basophils Absolute 0.0 0.0 - 0.2 x10E3/uL   Immature Granulocytes 0 Not Estab. %   Immature Grans (Abs) 0.0 0.0 - 0.1 x10E3/uL  TSH  Result Value Ref Range   TSH 1.310 0.450 - 4.500 uIU/mL  Lipid Panel w/o Chol/HDL Ratio  Result Value Ref Range   Cholesterol, Total 153 100 - 199 mg/dL   Triglycerides 149 0 - 149 mg/dL   HDL 38 (L) >39 mg/dL   VLDL Cholesterol Cal 26 5 - 40 mg/dL   LDL Chol Calc (NIH) 89 0 - 99 mg/dL  Ferritin  Result Value Ref Range   Ferritin 396 30 - 400 ng/mL  Vitamin D (25 hydroxy)  Result Value Ref Range   Vit D, 25-Hydroxy 43.1 30.0 - 100.0 ng/mL  Bayer DCA Hb A1c Waived  Result Value Ref Range   HB A1C (BAYER DCA - WAIVED) 5.4 <7.0 %        Current Outpatient Medications:  .  ALLERGY RELIEF 10 MG tablet, TAKE ONE TABLET BY MOUTH EVERY DAY, Disp: 7 tablet, Rfl: 10 .  amLODipine (NORVASC) 2.5 MG tablet, Take 1 tablet (2.5 mg total) by mouth daily., Disp: 90 tablet, Rfl: 1 .  calcium-vitamin D (OSCAL WITH D) 500-200 MG-UNIT tablet, Take 1 tablet by mouth daily with breakfast., Disp: 30 tablet, Rfl: 2 .  divalproex (DEPAKOTE ER) 500 MG 24 hr tablet, Take 500 mg by mouth at bedtime., Disp: , Rfl:  .  haloperidol (HALDOL) 10 MG tablet, Take 10 mg by mouth 3 (three) times daily. , Disp: , Rfl:  .  lisinopril (ZESTRIL) 20 MG tablet, Take 1 tablet (20 mg total) by mouth daily., Disp: 90 tablet, Rfl: 1 .  loratadine (CLARITIN) 10 MG tablet, Take 10   mg by mouth daily., Disp: , Rfl:  .  montelukast (SINGULAIR) 10 MG tablet, TAKE ONE TABLET BY MOUTH AT BEDTIME, Disp: 7 tablet, Rfl: 10 .  risperiDONE (RISPERDAL) 3 MG tablet, Take 6 mg by mouth at bedtime. , Disp: ,  Rfl:  .  fenofibrate (TRICOR) 145 MG tablet, Take 145 mg by mouth daily. (Patient not taking: Reported on 12/10/2020), Disp: , Rfl:     Assessment & Plan:  Acute sinusitis :  Will start pt on on a zpak Flu and COVID test ordered at this visit, both negative. pt advised to take Tylenol q 4- 6 hourly as needed. pt to take allegra q pm as needed and to call office if symptoms worsened pt verbalised understanding of such.     Problem List Items Addressed This Visit   None Visit Diagnoses     Cough headache    -  Primary   Relevant Orders   Novel Coronavirus, NAA ([facility])   Veritor Flu A/B Waived   Chest congestion       Relevant Orders   Novel Coronavirus, NAA ([facility])   Veritor Flu A/B Waived        Orders Placed This Encounter  Procedures  . Novel Coronavirus, NAA ([facility])  . Veritor Flu A/B Waived     No orders of the defined types were placed in this encounter.    Follow up plan: No follow-ups on file.

## 2020-12-12 LAB — NOVEL CORONAVIRUS, NAA: SARS-CoV-2, NAA: NOT DETECTED

## 2020-12-12 LAB — SARS-COV-2, NAA 2 DAY TAT

## 2021-05-05 NOTE — Progress Notes (Signed)
BP 136/82    Pulse (!) 126    Temp 98 F (36.7 C) (Oral)    Ht 5\' 5"  (1.651 m)    Wt 160 lb 6.4 oz (72.8 kg)    SpO2 98%    BMI 26.69 kg/m    Subjective:    Patient ID: David Santos, male    DOB: 1985/07/20, 35 y.o.   MRN: [id number]  HPI: David Santos is a 35 y.o. male  Chief Complaint  Patient presents with   FL2 Form   HYPERTENSION Hypertension status: controlled  Satisfied with current treatment? no Duration of hypertension: years BP monitoring frequency:  not checking BP range:  BP medication side effects:  no Medication compliance: excellent compliance Previous BP meds:amlodipine and lisinopril Aspirin: no Recurrent headaches: no Visual changes: no Palpitations: no Dyspnea: no Chest pain: no Lower extremity edema: no Dizzy/lightheaded: no  Relevant past medical, surgical, family and social history reviewed and updated as indicated. Interim medical history since our last visit reviewed. Allergies and medications reviewed and updated.  Review of Systems  Eyes:  Negative for visual disturbance.  Respiratory:  Negative for shortness of breath.   Cardiovascular:  Negative for chest pain and leg swelling.  Neurological:  Negative for light-headedness and headaches.   Per HPI unless specifically indicated above     Objective:    BP 136/82    Pulse (!) 126    Temp 98 F (36.7 C) (Oral)    Ht 5\' 5"  (1.651 m)    Wt 160 lb 6.4 oz (72.8 kg)    SpO2 98%    BMI 26.69 kg/m   Wt Readings from Last 3 Encounters:  05/06/21 160 lb 6.4 oz (72.8 kg)  12/10/20 162 lb 6.4 oz (73.7 kg)  08/03/20 161 lb 3.2 oz (73.1 kg)    Physical Exam Vitals and nursing note reviewed.  Constitutional:      General: He is not in acute distress.    Appearance: Normal appearance. He is not ill-appearing, toxic-appearing or diaphoretic.  HENT:     Head: Normocephalic.     Right Ear: External ear normal.     Left Ear: External ear normal.     Nose: Nose normal. No congestion or  rhinorrhea.     Mouth/Throat:     Mouth: Mucous membranes are moist.  Eyes:     General:        Right eye: No discharge.        Left eye: No discharge.     Extraocular Movements: Extraocular movements intact.     Conjunctiva/sclera: Conjunctivae normal.     Pupils: Pupils are equal, round, and reactive to light.  Cardiovascular:     Rate and Rhythm: Normal rate and regular rhythm.     Heart sounds: No murmur heard. Pulmonary:     Effort: Pulmonary effort is normal. No respiratory distress.     Breath sounds: Normal breath sounds. No wheezing, rhonchi or rales.  Abdominal:     General: Abdomen is flat. Bowel sounds are normal.  Musculoskeletal:     Cervical back: Normal range of motion and neck supple.  Skin:    General: Skin is warm and dry.     Capillary Refill: Capillary refill takes less than 2 seconds.  Neurological:     General: No focal deficit present.     Mental Status: He is alert and oriented to person, place, and time.  Psychiatric:        Mood  and Affect: Mood normal.        Behavior: Behavior normal.        Thought Content: Thought content normal.        Judgment: Judgment normal.    Results for orders placed or performed in visit on 12/10/20  Novel Coronavirus, NAA ([facility])   Specimen: Nasopharyngeal(NP) swabs in vial transport medium  Result Value Ref Range   SARS-CoV-2, NAA Not Detected Not Detected  SARS-COV-2, NAA 2 DAY TAT  Result Value Ref Range   SARS-CoV-2, NAA 2 DAY TAT Performed   Veritor Flu A/B Waived  Result Value Ref Range   Influenza A Negative Negative   Influenza B Negative Negative      Assessment & Plan:   Problem List Items Addressed This Visit       Cardiovascular and Mediastinum   Hypertension - Primary    Chronic.  Controlled.  Continue with current medication regimen of Amlodipine 2.5mg  and Lisinopril 20mg  daily.  Return to clinic in 6 months for reevaluation.  Call sooner if concerns arise.        Other Visit Diagnoses      Encounter for completion of form with patient       FL2 form completed for patient during visit.        Follow up plan: Return in about 6 months (around 11/04/2021) for Follow up.

## 2021-05-06 ENCOUNTER — Ambulatory Visit (INDEPENDENT_AMBULATORY_CARE_PROVIDER_SITE_OTHER): Payer: Medicare Other | Admitting: Nurse Practitioner

## 2021-05-06 ENCOUNTER — Encounter: Payer: Self-pay | Admitting: Nurse Practitioner

## 2021-05-06 ENCOUNTER — Other Ambulatory Visit: Payer: Self-pay

## 2021-05-06 VITALS — BP 136/82 | HR 126 | Temp 98.0°F | Ht 65.0 in | Wt 160.4 lb

## 2021-05-06 DIAGNOSIS — I10 Essential (primary) hypertension: Secondary | ICD-10-CM

## 2021-05-06 DIAGNOSIS — Z0289 Encounter for other administrative examinations: Secondary | ICD-10-CM

## 2021-05-06 NOTE — Assessment & Plan Note (Signed)
Chronic.  Controlled.  Continue with current medication regimen of Amlodipine 2.5mg  and Lisinopril 20mg  daily.  Return to clinic in 6 months for reevaluation.  Call sooner if concerns arise.

## 2021-05-11 ENCOUNTER — Telehealth: Payer: Self-pay | Admitting: Nurse Practitioner

## 2021-05-11 NOTE — Telephone Encounter (Signed)
Copied from CRM [id number]. Topic: General - Other >> May 11, 2021  2:29 PM Gaetana Michaelis A wrote: Reason for CRM: The patient's caregiver would like to be contacted to discuss their prescription for azithromycin (ZITHROMAX) 250 MG tablet [[id number]]  ENDED  The patient's caregiver would like the medication removed from patient's med list   Please contact further when possible

## 2021-05-12 NOTE — Telephone Encounter (Signed)
Left message for patient caregiver to give our office a call back in regards to the prescription.

## 2021-05-12 NOTE — Telephone Encounter (Signed)
Spoke with caregiver and she informed me that the patient's pharmacy needed a discontinue order for the prescription for Fexofidine and Azrithomycin to be removed so they won't fill the prescription. Spoke with [facility] from the [facility]. She placed a discontinue order for the prescriptions and states she thinks that would take care of what the caregiver was requesting.

## 2021-05-13 ENCOUNTER — Telehealth: Payer: Self-pay

## 2021-05-13 NOTE — Telephone Encounter (Signed)
Copied from CRM [id number]. Topic: General - Other >> May 12, 2021  3:13 PM Marylen Ponto wrote: Reason for CRM: Pt caregiver returned call to office. Attempted to transfer call but there was no answer.

## 2021-06-14 ENCOUNTER — Other Ambulatory Visit: Payer: Self-pay | Admitting: Family Medicine

## 2021-06-14 DIAGNOSIS — J3089 Other allergic rhinitis: Secondary | ICD-10-CM

## 2021-11-02 NOTE — Progress Notes (Deleted)
   There were no vitals taken for this visit.   Subjective:    Patient ID: David Santos, male    DOB: 11/28/1985, 36 y.o.   MRN: [id number]  HPI: David Santos is a 36 y.o. male  No chief complaint on file.  HYPERTENSION / HYPERLIPIDEMIA Satisfied with current treatment? {Blank single:19197::"yes","no"} Duration of hypertension: {Blank single:19197::"chronic","months","years"} BP monitoring frequency: {Blank single:19197::"not checking","rarely","daily","weekly","monthly","a few times a day","a few times a week","a few times a month"} BP range:  BP medication side effects: {Blank single:19197::"yes","no"} Past BP meds: {Blank multiple:19196::"none","amlodipine","amlodipine/benazepril","atenolol","benazepril","benazepril/HCTZ","bisoprolol (bystolic)","carvedilol","chlorthalidone","clonidine","diltiazem","exforge HCT","HCTZ","irbesartan (avapro)","labetalol","lisinopril","lisinopril-HCTZ","losartan (cozaar)","methyldopa","nifedipine","olmesartan (benicar)","olmesartan-HCTZ","quinapril","ramipril","spironalactone","tekturna","valsartan","valsartan-HCTZ","verapamil"} Duration of hyperlipidemia: {Blank single:19197::"chronic","months","years"} Cholesterol medication side effects: {Blank single:19197::"yes","no"} Cholesterol supplements: {Blank multiple:19196::"none","fish oil","niacin","red yeast rice"} Past cholesterol medications: {Blank multiple:19196::"none","atorvastain (lipitor)","lovastatin (mevacor)","pravastatin (pravachol)","rosuvastatin (crestor)","simvastatin (zocor)","vytorin","fenofibrate (tricor)","gemfibrozil","ezetimide (zetia)","niaspan","lovaza"} Medication compliance: {Blank single:19197::"excellent compliance","good compliance","fair compliance","poor compliance"} Aspirin: {Blank single:19197::"yes","no"} Recent stressors: {Blank single:19197::"yes","no"} Recurrent headaches: {Blank single:19197::"yes","no"} Visual changes: {Blank  single:19197::"yes","no"} Palpitations: {Blank single:19197::"yes","no"} Dyspnea: {Blank single:19197::"yes","no"} Chest pain: {Blank single:19197::"yes","no"} Lower extremity edema: {Blank single:19197::"yes","no"} Dizzy/lightheaded: {Blank single:19197::"yes","no"}  Relevant past medical, surgical, family and social history reviewed and updated as indicated. Interim medical history since our last visit reviewed. Allergies and medications reviewed and updated.  Review of Systems  Per HPI unless specifically indicated above     Objective:    There were no vitals taken for this visit.  Wt Readings from Last 3 Encounters:  05/06/21 160 lb 6.4 oz (72.8 kg)  12/10/20 162 lb 6.4 oz (73.7 kg)  08/03/20 161 lb 3.2 oz (73.1 kg)    Physical Exam  Results for orders placed or performed in visit on 12/10/20  Novel Coronavirus, NAA ([facility])   Specimen: Nasopharyngeal(NP) swabs in vial transport medium  Result Value Ref Range   SARS-CoV-2, NAA Not Detected Not Detected  SARS-COV-2, NAA 2 DAY TAT  Result Value Ref Range   SARS-CoV-2, NAA 2 DAY TAT Performed   Veritor Flu A/B Waived  Result Value Ref Range   Influenza A Negative Negative   Influenza B Negative Negative      Assessment & Plan:   Problem List Items Addressed This Visit       Cardiovascular and Mediastinum   Hypertension - Primary     Other   Vitamin D deficiency   Dyslipidemia   Schizophreniform disorder (HCC)     Follow up plan: No follow-ups on file.

## 2021-11-03 ENCOUNTER — Ambulatory Visit: Payer: Medicare Other | Admitting: Nurse Practitioner

## 2021-11-03 DIAGNOSIS — I10 Essential (primary) hypertension: Secondary | ICD-10-CM

## 2021-11-03 DIAGNOSIS — E559 Vitamin D deficiency, unspecified: Secondary | ICD-10-CM

## 2021-11-03 DIAGNOSIS — E785 Hyperlipidemia, unspecified: Secondary | ICD-10-CM

## 2021-11-03 DIAGNOSIS — F2081 Schizophreniform disorder: Secondary | ICD-10-CM

## 2021-11-25 IMAGING — CT CT CHEST W/ CM
2 of 4 series · 14 of 36 positions shown, 17 images · IV contrast (omnipaque)
Comparison: None.

CLINICAL DATA: Patient with weight loss. Elevated. Ten.

EXAM:
CT CHEST, ABDOMEN, AND PELVIS WITH CONTRAST
TECHNIQUE: Multidetector CT imaging of the chest, abdomen and pelvis was
performed following the standard protocol during bolus
administration of intravenous contrast.
CONTRAST:  75mL OMNIPAQUE IOHEXOL 300 MG/ML  SOLN

[Series 2: cap with · axial · 0.68mm/px · z∈[-652,-112]mm · 11 of 130 slices shown, 14 images]
[im 11/130  mediastinal]
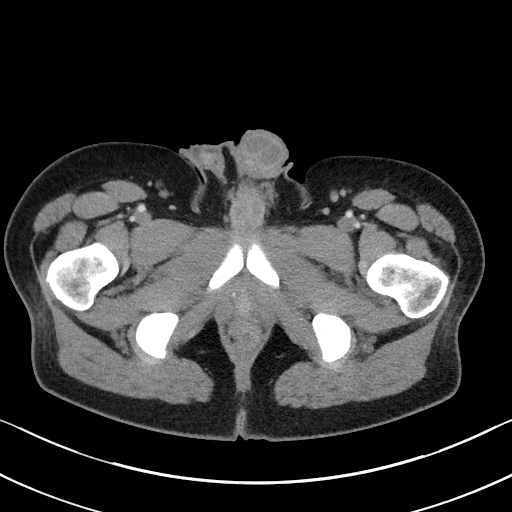
[im 11/130  lung]
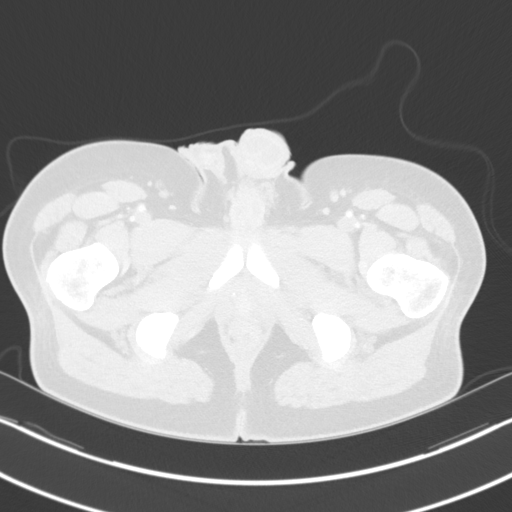
[im 22/130  lung]
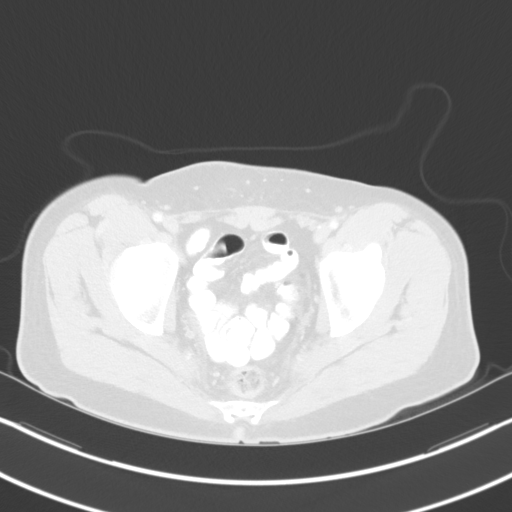
[im 33/130  lung]
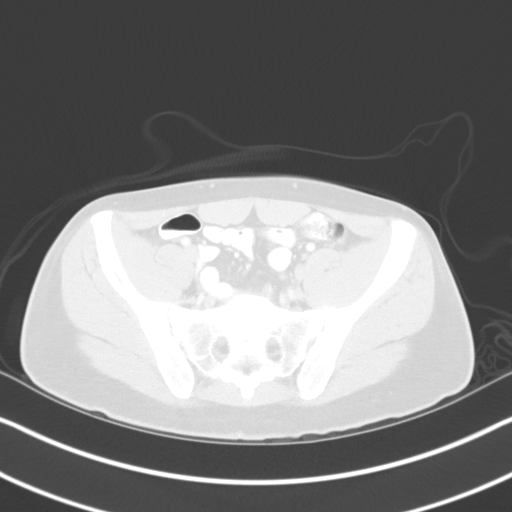
[im 44/130  lung]
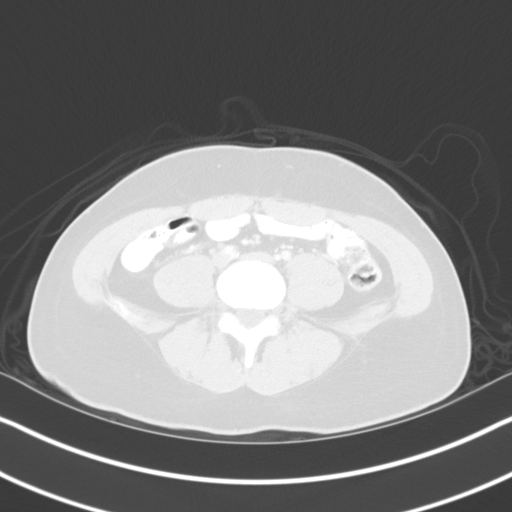
[im 54/130  mediastinal]
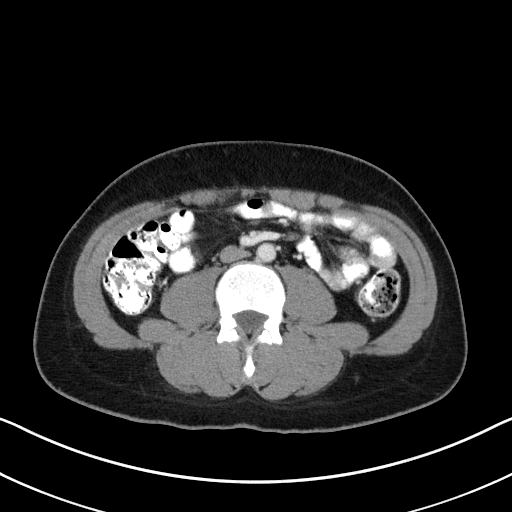
[im 54/130  lung]
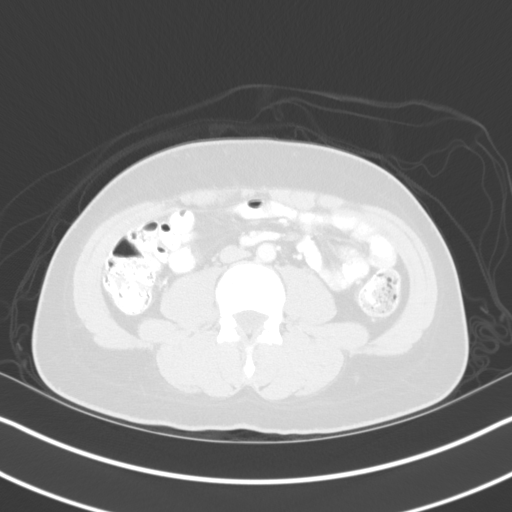
[im 65/130  lung]
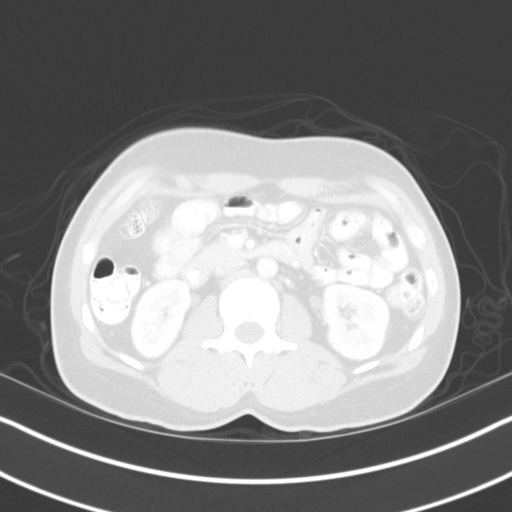
[im 76/130  lung]
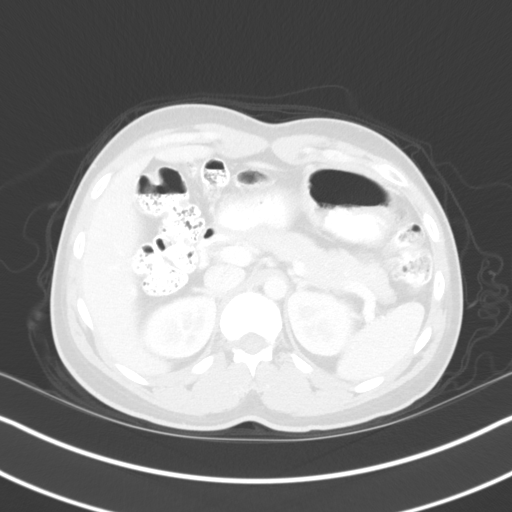
[im 87/130  lung]
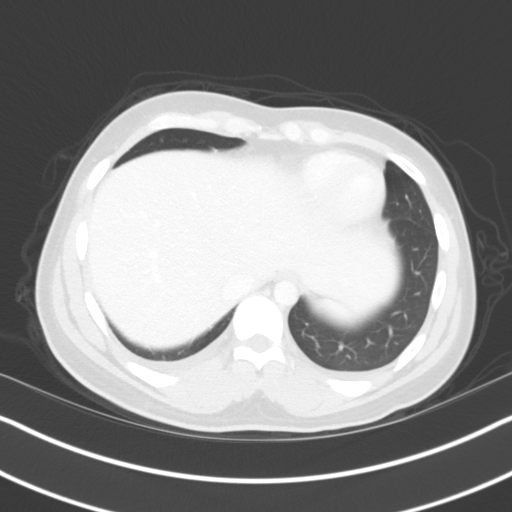
[im 97/130  mediastinal]
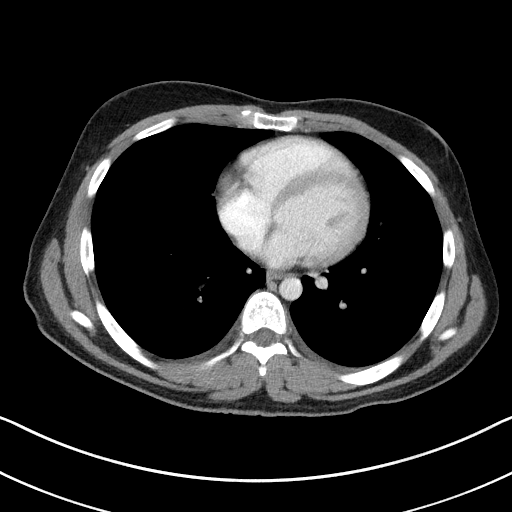
[im 97/130  lung]
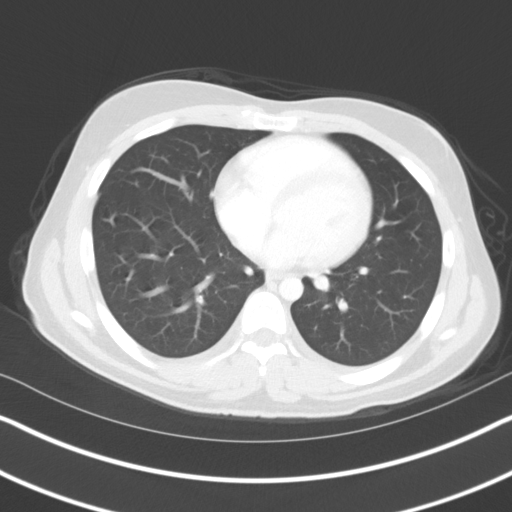
[im 108/130  lung]
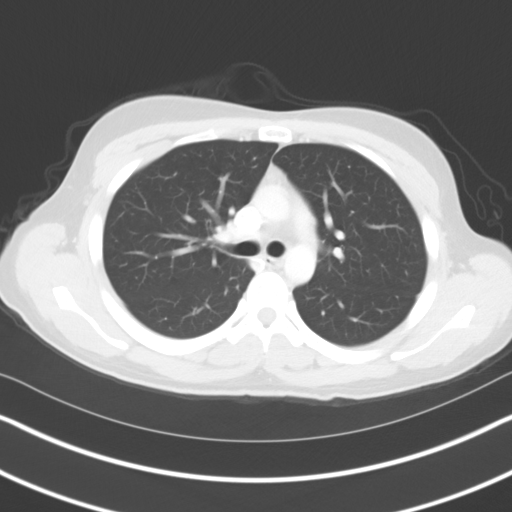
[im 119/130  lung]
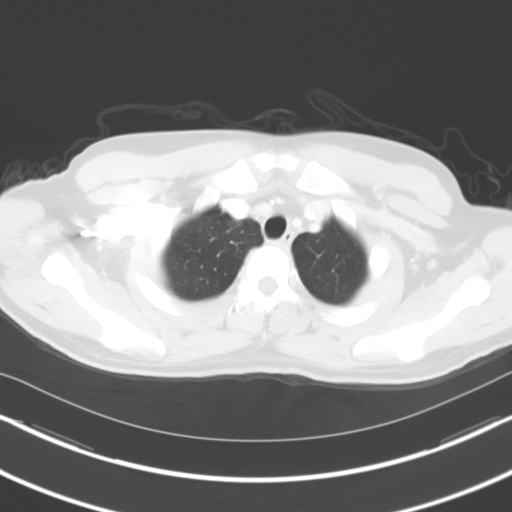

[Series 5: coronals · coronal · 0.73mm/px · 3 of 113 slices shown]
[im 23/113  lung]
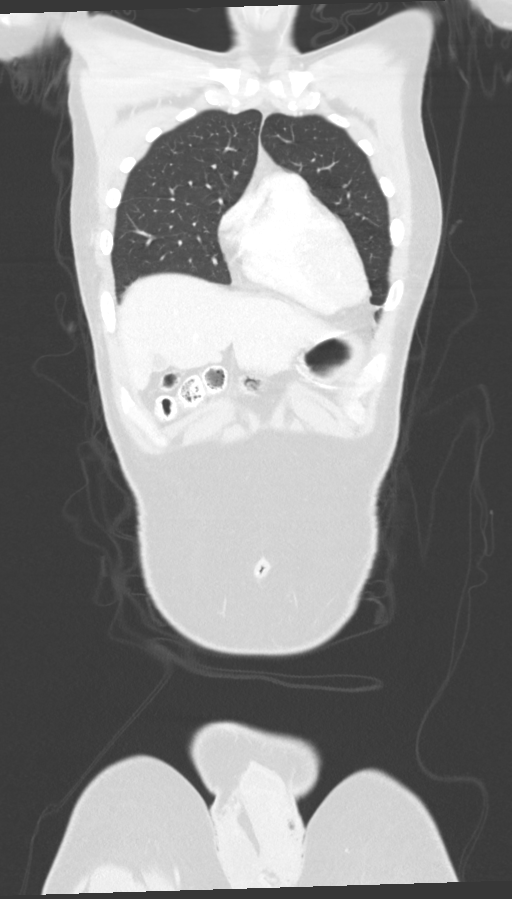
[im 45/113  lung]
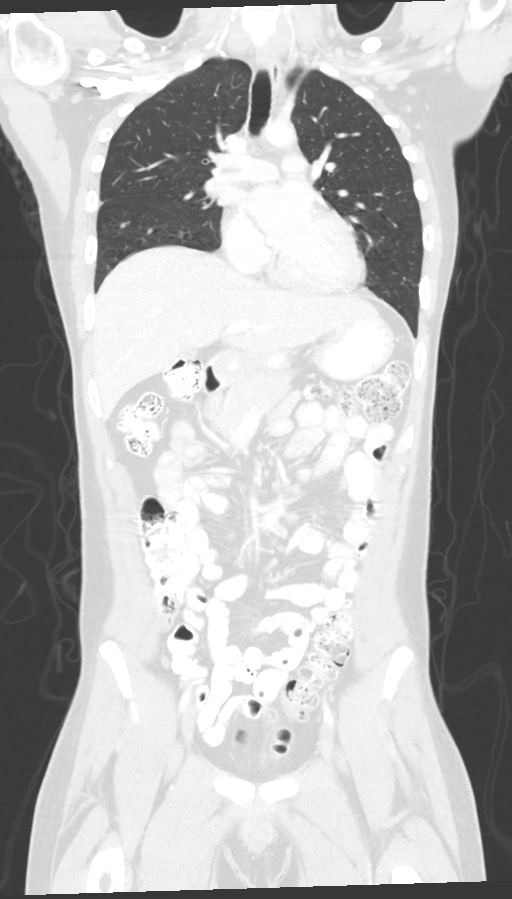
[im 68/113  lung]
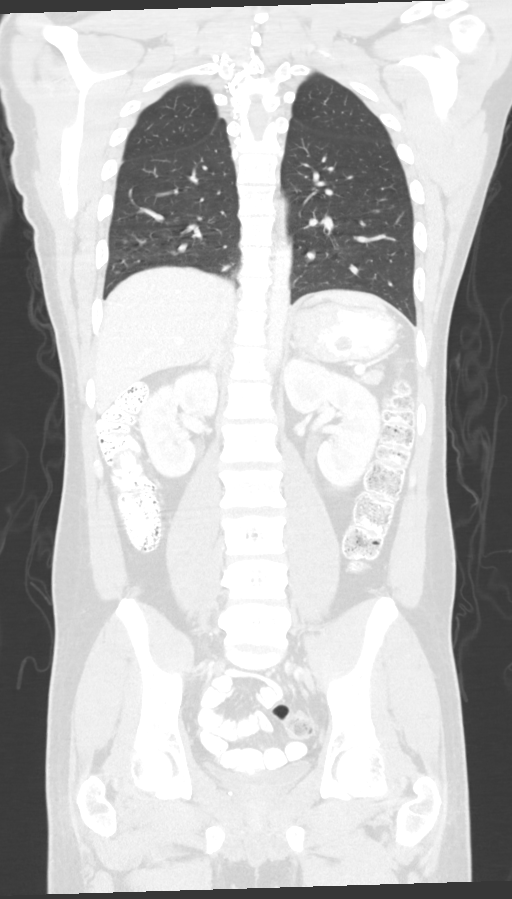

[14 of 36 positions shown; findings below may reference images not displayed]

FINDINGS: CT CHEST FINDINGS

Cardiovascular: Normal heart size. No pericardial effusion. Aorta
and main pulmonary artery are normal in caliber.

Mediastinum/Nodes: No enlarged axillary, mediastinal or hilar
lymphadenopathy. Normal esophagus.

Lungs/Pleura: Central airways are patent. No large area of pulmonary
consolidation. Small right and trace left pleural effusions. No
pneumothorax.

Musculoskeletal: No aggressive or acute appearing osseous lesions.

CT ABDOMEN PELVIS FINDINGS

Hepatobiliary: Liver is normal in size and contour. Gallbladder is
decompressed.

Pancreas: Unremarkable

Spleen: Unremarkable

Adrenals/Urinary Tract: Normal adrenal glands. Kidneys enhance
symmetrically with contrast. No hydronephrosis. Urinary bladder is
decompressed.

Stomach/Bowel: Oral contrast material throughout the small and large
bowel. Normal appendix. No evidence for bowel obstruction. No free
fluid or free intraperitoneal air. Normal morphology of the stomach.

Vascular/Lymphatic: Normal caliber abdominal aorta. No
retroperitoneal lymphadenopathy.

Reproductive: Unremarkable.

Other: None.

Musculoskeletal: No aggressive or acute appearing osseous lesions.
IMPRESSION: 1. Small right and trace left pleural effusions.
2. No acute process within the chest, abdomen or pelvis.

## 2022-03-07 ENCOUNTER — Other Ambulatory Visit: Payer: Self-pay | Admitting: Nurse Practitioner

## 2022-03-07 DIAGNOSIS — J3089 Other allergic rhinitis: Secondary | ICD-10-CM

## 2022-03-07 NOTE — Telephone Encounter (Signed)
Requested Prescriptions  Pending Prescriptions Disp Refills  . ALLERGY RELIEF 10 MG tablet [Pharmacy Med Name: Allergy Relief 10 MG Tablet] 90 tablet 0    Sig: TAKE 1 TABLET BY MOUTH ONCE DAILY     Ear, Nose, and Throat:  Antihistamines 2 Failed - 03/07/2022 11:32 AM      Failed - Cr in normal range and within 360 days    Creat  Date Value Ref Range Status  05/08/2019 0.78 0.60 - 1.35 mg/dL Final   Creatinine, Ser  Date Value Ref Range Status  08/03/2020 0.69 (L) 0.76 - 1.27 mg/dL Final         Passed - Valid encounter within last 12 months    Recent Outpatient Visits          10 months ago Benign essential hypertension   [facility] Jon Billings, NP   1 year ago Cough headache   [facility] Vigg, Avanti, MD   1 year ago Benign essential hypertension   [facility] McElwee, Lauren A, NP   1 year ago Encounter for completion of form with patient   [facility] Marnee Guarneri T, NP   2 years ago Flu vaccine need   [facility] Eulogio Bear, NP      Future Appointments            In 2 months Jon Billings, NP [facility], [facility]           . montelukast (SINGULAIR) 10 MG tablet [Pharmacy Med Name: Montelukast Sodium 10 MG Tablet] 90 tablet 0    Sig: TAKE 1 TABLET BY MOUTH AT BEDTIME     Pulmonology:  Leukotriene Inhibitors Passed - 03/07/2022 11:32 AM      Passed - Valid encounter within last 12 months    Recent Outpatient Visits          10 months ago Benign essential hypertension   [place], NP   1 year ago Cough headache   [facility] Vigg, Avanti, MD   1 year ago Benign essential hypertension   [facility] McElwee, Lauren A, NP   1 year ago Encounter for completion of form with patient   [facility] Marnee Guarneri T, NP   2 years ago Flu vaccine need   [facility] Eulogio Bear, NP       Future Appointments            In 2 months Jon Billings, NP [facility], [place]

## 2022-05-09 ENCOUNTER — Ambulatory Visit (INDEPENDENT_AMBULATORY_CARE_PROVIDER_SITE_OTHER): Payer: Medicare Other | Admitting: Nurse Practitioner

## 2022-05-09 ENCOUNTER — Encounter: Payer: Self-pay | Admitting: Nurse Practitioner

## 2022-05-09 VITALS — BP 136/81 | HR 105 | Temp 98.5°F | Ht 65.0 in | Wt 159.9 lb

## 2022-05-09 DIAGNOSIS — F2081 Schizophreniform disorder: Secondary | ICD-10-CM | POA: Diagnosis not present

## 2022-05-09 DIAGNOSIS — J3089 Other allergic rhinitis: Secondary | ICD-10-CM | POA: Diagnosis not present

## 2022-05-09 DIAGNOSIS — Z0000 Encounter for general adult medical examination without abnormal findings: Secondary | ICD-10-CM

## 2022-05-09 DIAGNOSIS — E785 Hyperlipidemia, unspecified: Secondary | ICD-10-CM

## 2022-05-09 DIAGNOSIS — I10 Essential (primary) hypertension: Secondary | ICD-10-CM | POA: Diagnosis not present

## 2022-05-09 DIAGNOSIS — E559 Vitamin D deficiency, unspecified: Secondary | ICD-10-CM | POA: Diagnosis not present

## 2022-05-09 DIAGNOSIS — Z0289 Encounter for other administrative examinations: Secondary | ICD-10-CM

## 2022-05-09 MED ORDER — AMLODIPINE BESYLATE 2.5 MG PO TABS: 2.5000 mg | ORAL_TABLET | Freq: Every day | ORAL | 1 refills | Status: DC

## 2022-05-09 MED ORDER — LISINOPRIL 20 MG PO TABS: 20.0000 mg | ORAL_TABLET | Freq: Every day | ORAL | 1 refills | Status: DC

## 2022-05-09 MED ORDER — MONTELUKAST SODIUM 10 MG PO TABS: 10.0000 mg | ORAL_TABLET | Freq: Every day | ORAL | 1 refills | Status: DC

## 2022-05-09 NOTE — Progress Notes (Signed)
BP 136/81   Pulse (!) 105   Temp 98.5 F (36.9 C) (Oral)   Ht 5\' 5"  (1.651 m)   Wt 159 lb 14.4 oz (72.5 kg)   SpO2 97%   BMI 26.61 kg/m    Subjective:    Patient ID: , male    DOB: 1985-05-16, 37 y.o.   MRN: [id number]  HPI: David Santos is a 37 y.o. male presenting on 05/09/2022 for comprehensive medical examination. Current medical complaints include: needs FL2 form filled out.  He currently lives with: Interim Problems from his last visit: no  HYPERTENSION Hypertension status: controlled  Satisfied with current treatment? no Duration of hypertension: years BP monitoring frequency:  not checking BP range:  BP medication side effects:  no Medication compliance: excellent compliance Previous BP meds:amlodipine and lisinopril Aspirin: no Recurrent headaches: no Visual changes: no Palpitations: no Dyspnea: no Chest pain: no Lower extremity edema: no Dizzy/lightheaded: no   Functional Status Survey: Is the patient deaf or have difficulty hearing?: No Does the patient have difficulty seeing, even when wearing glasses/contacts?: No Does the patient have difficulty concentrating, remembering, or making decisions?: No Does the patient have difficulty walking or climbing stairs?: Yes Does the patient have difficulty dressing or bathing?: No Does the patient have difficulty doing errands alone such as visiting a doctor's office or shopping?: Yes  FALL RISK:    05/09/2022    8:31 AM 05/09/2022    8:25 AM 05/06/2021    8:07 AM 12/10/2020    9:07 AM 05/05/2020   10:28 AM  Fall Risk   Falls in the past year? 0 0 0 0 0  Number falls in past yr: 0 0 0 0   Injury with Fall? 0 0 0 0   Risk for fall due to : No Fall Risks No Fall Risks No Fall Risks No Fall Risks   Follow up Falls evaluation completed Falls evaluation completed Falls evaluation completed Falls evaluation completed     Depression Screen    05/09/2022    8:32 AM 05/06/2021    8:07 AM  08/03/2020    8:25 AM 05/05/2020   10:28 AM 08/01/2019   10:49 AM  Depression screen PHQ 2/9  Decreased Interest 0 0 0 0 0  Down, Depressed, Hopeless 0 0 0 0 0  PHQ - 2 Score 0 0 0 0 0  Altered sleeping 0 0 0  0  Tired, decreased energy 0 0 0  0  Change in appetite 0 0 0  0  Feeling bad or failure about yourself  0 0 0  0  Trouble concentrating 0 0 0  0  Moving slowly or fidgety/restless 0 0 0  0  Suicidal thoughts 0 0 0  0  PHQ-9 Score 0 0 0  0  Difficult doing work/chores Not difficult at all Not difficult at all       Advanced Directives Discussed at visit today.  Does not have one at this time.   Past Medical History:  Past Medical History:  Diagnosis Date   Allergy    Autism    Hyperlipidemia    Hypertension    Psychosis (HCC)    Vitamin D deficiency     Surgical History:  Past Surgical History:  Procedure Laterality Date   TONSILLECTOMY AND ADENOIDECTOMY      Medications:  Current Outpatient Medications on File Prior to Visit  Medication Sig   ALLERGY RELIEF 10 MG tablet TAKE 1 TABLET  BY MOUTH ONCE DAILY   calcium-vitamin D (OSCAL WITH D) 500-200 MG-UNIT tablet Take 1 tablet by mouth daily with breakfast.   divalproex (DEPAKOTE ER) 500 MG 24 hr tablet Take 500 mg by mouth at bedtime.   fexofenadine (ALLEGRA ALLERGY) 180 MG tablet Take 1 tablet (180 mg total) by mouth daily.   haloperidol (HALDOL) 10 MG tablet Take 10 mg by mouth 3 (three) times daily.    risperiDONE (RISPERDAL) 3 MG tablet Take 6 mg by mouth at bedtime.    No current facility-administered medications on file prior to visit.    Allergies:  Allergies  Allergen Reactions   Hydrochlorothiazide     Other reaction(s): Other (See Comments) Acute renal failure    Social History:  Social History   Socioeconomic History   Marital status: Single    Spouse name: Not on file   Number of children: 0   Years of education: Not on file   Highest education level: Not on file  Occupational  History   Not on file  Tobacco Use   Smoking status: Never   Smokeless tobacco: Never  Vaping Use   Vaping Use: Never used  Substance and Sexual Activity   Alcohol use: No    Alcohol/week: 0.0 standard drinks of alcohol   Drug use: No   Sexual activity: Never  Other Topics Concern   Not on file  Social History Narrative   Lives in a group home - [place] Determinants of Health   Financial Resource Strain: Low Risk  (04/26/2017)   Overall Financial Resource Strain (CARDIA)    Difficulty of Paying Living Expenses: Not very hard  Food Insecurity: No Food Insecurity (04/26/2017)   Hunger Vital Sign    Worried About Running Out of Food in the Last Year: Never true    Ran Out of Food in the Last Year: Never true  Transportation Needs: No Transportation Needs (04/26/2017)   PRAPARE - Hydrologist (Medical): No    Lack of Transportation (Non-Medical): No  Physical Activity: Inactive (09/14/2017)   Exercise Vital Sign    Days of Exercise per Week: 0 days    Minutes of Exercise per Session: 0 min  Stress: Unknown (04/26/2017)   [place]    Feeling of Stress : Patient refused  Social Connections: Socially Isolated (04/26/2017)   Social Connection and Isolation Panel [NHANES]    Frequency of Communication with Friends and Family: Once a week    Frequency of Social Gatherings with Friends and Family: Once a week    Attends Religious Services: Never    Marine scientist or Organizations: No    Attends Archivist Meetings: Never    Marital Status: Never married  Intimate Partner Violence: Not At Risk (04/26/2017)   Humiliation, Afraid, Rape, and Kick questionnaire    Fear of Current or Ex-Partner: No    Emotionally Abused: No    Physically Abused: No    Sexually Abused: No   Social History   Tobacco Use  Smoking Status Never  Smokeless Tobacco  Never   Social History   Substance and Sexual Activity  Alcohol Use No   Alcohol/week: 0.0 standard drinks of alcohol    Family History:  Family History  Problem Relation Age of Onset   Hypertension Mother    Berenice Primas' disease Mother    Breast cancer Mother  Mental illness Father     Past medical history, surgical history, medications, allergies, family history and social history reviewed with patient today and changes made to appropriate areas of the chart.   Review of Systems  Eyes:  Negative for blurred vision and double vision.  Respiratory:  Negative for shortness of breath.   Cardiovascular:  Negative for chest pain, palpitations and leg swelling.  Neurological:  Negative for dizziness and headaches.   All other ROS negative except what is listed above and in the HPI.      Objective:    BP 136/81   Pulse (!) 105   Temp 98.5 F (36.9 C) (Oral)   Ht 5\' 5"  (1.651 m)   Wt 159 lb 14.4 oz (72.5 kg)   SpO2 97%   BMI 26.61 kg/m   Wt Readings from Last 3 Encounters:  05/09/22 159 lb 14.4 oz (72.5 kg)  05/06/21 160 lb 6.4 oz (72.8 kg)  12/10/20 162 lb 6.4 oz (73.7 kg)    No results found.  Physical Exam Vitals and nursing note reviewed.  Constitutional:      General: He is not in acute distress.    Appearance: Normal appearance. He is not ill-appearing, toxic-appearing or diaphoretic.  HENT:     Head: Normocephalic.     Right Ear: Tympanic membrane, ear canal and external ear normal.     Left Ear: Tympanic membrane, ear canal and external ear normal.     Nose: Nose normal. No congestion or rhinorrhea.     Mouth/Throat:     Mouth: Mucous membranes are moist.  Eyes:     General:        Right eye: No discharge.        Left eye: No discharge.     Extraocular Movements: Extraocular movements intact.     Conjunctiva/sclera: Conjunctivae normal.     Pupils: Pupils are equal, round, and reactive to light.  Cardiovascular:     Rate and Rhythm: Normal rate and  regular rhythm.     Heart sounds: No murmur heard. Pulmonary:     Effort: Pulmonary effort is normal. No respiratory distress.     Breath sounds: Normal breath sounds. No wheezing, rhonchi or rales.  Abdominal:     General: Abdomen is flat. Bowel sounds are normal. There is no distension.     Palpations: Abdomen is soft.     Tenderness: There is no abdominal tenderness. There is no guarding.  Musculoskeletal:     Cervical back: Normal range of motion and neck supple.  Skin:    General: Skin is warm and dry.     Capillary Refill: Capillary refill takes less than 2 seconds.  Neurological:     General: No focal deficit present.     Mental Status: He is alert and oriented to person, place, and time.     Cranial Nerves: No cranial nerve deficit.     Motor: No weakness.     Deep Tendon Reflexes: Reflexes normal.  Psychiatric:        Mood and Affect: Mood normal.        Behavior: Behavior normal.        Thought Content: Thought content normal.        Judgment: Judgment normal.         No data to display          Cognitive Testing - 6-CIT  Correct? Score   What year is it? no 4 Yes = 0  No = 4  What month is it? no 4 Yes = 0    No = 3  Remember:     [address] [place], [place]     What time is it? no 4 Yes = 0    No = 3  Count backwards from 20 to 1 no 4 Correct = 0    1 error = 2   More than 1 error = 4  Say the months of the year in reverse. no 4 Correct = 0    1 error = 2   More than 1 error = 4  What address did I ask you to remember? no 10 Correct = 0  1 error = 2    2 error = 4    3 error = 6    4 error = 8    All wrong = 10       TOTAL SCORE  30/28   Interpretation:  Developmental delay and Schizophrenia  Normal (0-7) Abnormal (8-28)    Results for orders placed or performed in visit on 12/10/20  Novel Coronavirus, NAA ([facility])   Specimen: Nasopharyngeal(NP) swabs in vial transport medium  Result Value Ref Range   SARS-CoV-2, NAA Not Detected Not  Detected  SARS-COV-2, NAA 2 DAY TAT  Result Value Ref Range   SARS-CoV-2, NAA 2 DAY TAT Performed   Veritor Flu A/B Waived  Result Value Ref Range   Influenza A Negative Negative   Influenza B Negative Negative      Assessment & Plan:   Problem List Items Addressed This Visit       Cardiovascular and Mediastinum   Hypertension    Chronic.  Controlled.  Continue with current medication regimen of Amlodipine 2.5mg  and Lisinopril 20mg  daily.  Refills sent today.  Return to clinic in 6 months for reevaluation.  Call sooner if concerns arise.        Relevant Medications   amLODipine (NORVASC) 2.5 MG tablet   lisinopril (ZESTRIL) 20 MG tablet     Respiratory   Perennial allergic rhinitis    Chronic.  Controlled.  Continue with current medication regimen of Singulair.  Refills sent today.  Labs ordered today.  Return to clinic in 6 months for reevaluation.  Call sooner if concerns arise.        Relevant Medications   montelukast (SINGULAIR) 10 MG tablet     Other   Vitamin D deficiency    Labs ordered at visit today.  Will make recommendations based on lab results.        Relevant Orders   Vitamin D (25 hydroxy)   Dyslipidemia    Chronic.  Controlled.  Continue with current medication regimen.  Labs ordered today.  Return to clinic in 6 months for reevaluation.  Call sooner if concerns arise.        Relevant Orders   Lipid panel   Schizophreniform disorder ([place])    Chronic.  Controlled.  Continue with current medication regimen.  Still seeing psychiatry.  Caregiver endorses taking medications daily.  Labs ordered today.  Return to clinic in 6 months for reevaluation.  Call sooner if concerns arise.        Other Visit Diagnoses     Encounter for annual wellness exam in Medicare patient    -  Primary   Annual physical exam       Health maintenance reviewed during visit today. Labs ordered. Not able to get TDAP due to  Medicare. Not due for PSA or Colonoscopy.    Relevant Orders   TSH   Lipid panel   CBC with Differential/Platelet   Comprehensive metabolic panel   Urinalysis, Routine w reflex microscopic   Encounter for completion of form with patient       Benign essential hypertension       Relevant Medications   amLODipine (NORVASC) 2.5 MG tablet   lisinopril (ZESTRIL) 20 MG tablet        Preventative Services:  Health Risk Assessment and Personalized Prevention Plan: reviewed during visit Bone Mass Measurements: NA CVD Screening: Up to date Colon Cancer Screening: NA Depression Screening: Up to date- followed by psychiatry Diabetes Screening: Up to date Glaucoma Screening: NA Hepatitis B vaccine: NA Hepatitis C screening: up to date HIV Screening: up to date Flu Vaccine: Up to date Lung cancer Screening: NA Obesity Screening: Up to date Pneumonia Vaccines (2): NA STI Screening: NA PSA screening: NA  Discussed aspirin prophylaxis for myocardial infarction prevention and decision was it was not indicated  LABORATORY TESTING:  Health maintenance labs ordered today as discussed above.     IMMUNIZATIONS:   - Tdap: Tetanus vaccination status reviewed: Not up to date. - Influenza: Up to date - Pneumovax: Not applicable - Prevnar: Not applicable - Zostavax vaccine: Not applicable  SCREENING: - Colonoscopy: Not applicable  Discussed with patient purpose of the colonoscopy is to detect colon cancer at curable precancerous or early stages   - AAA Screening: Not applicable  -Hearing Test: Not applicable  -Spirometry: Not applicable   PATIENT COUNSELING:    Sexuality: Discussed sexually transmitted diseases, partner selection, use of condoms, avoidance of unintended pregnancy  and contraceptive alternatives.   Advised to avoid cigarette smoking.  I discussed with the patient that most people either abstain from alcohol or drink within safe limits (<=14/week and <=4 drinks/occasion for males, <=7/weeks and <= 3  drinks/occasion for females) and that the risk for alcohol disorders and other health effects rises proportionally with the number of drinks per week and how often a drinker exceeds daily limits.  Discussed cessation/primary prevention of drug use and availability of treatment for abuse.   Diet: Encouraged to adjust caloric intake to maintain  or achieve ideal body weight, to reduce intake of dietary saturated fat and total fat, to limit sodium intake by avoiding high sodium foods and not adding table salt, and to maintain adequate dietary potassium and calcium preferably from fresh fruits, vegetables, and low-fat dairy products.    stressed the importance of regular exercise  Injury prevention: Discussed safety belts, safety helmets, smoke detector, smoking near bedding or upholstery.   Dental health: Discussed importance of regular tooth brushing, flossing, and dental visits.   Follow up plan: NEXT PREVENTATIVE PHYSICAL DUE IN 1 YEAR. Return in about 6 months (around 11/07/2022) for HTN, HLD, DM2 FU.

## 2022-05-09 NOTE — Patient Instructions (Signed)
Preventative Services:  Health Risk Assessment and Personalized Prevention Plan: reviewed during visit Bone Mass Measurements: NA CVD Screening: Up to date Colon Cancer Screening: NA Depression Screening: Up to date- followed by psychiatry Diabetes Screening: Up to date Glaucoma Screening: NA Hepatitis B vaccine: NA Hepatitis C screening: up to date HIV Screening: up to date Flu Vaccine: Up to date Lung cancer Screening: NA Obesity Screening: Up to date Pneumonia Vaccines (2): NA STI Screening: NA PSA screening: NA

## 2022-05-09 NOTE — Assessment & Plan Note (Signed)
Chronic.  Controlled.  Continue with current medication regimen of Amlodipine 2.5mg  and Lisinopril 20mg  daily.  Refills sent today.  Return to clinic in 6 months for reevaluation.  Call sooner if concerns arise.

## 2022-05-09 NOTE — Assessment & Plan Note (Signed)
Chronic.  Controlled.  Continue with current medication regimen.  Labs ordered today.  Return to clinic in 6 months for reevaluation.  Call sooner if concerns arise.  ? ?

## 2022-05-09 NOTE — Assessment & Plan Note (Signed)
Chronic.  Controlled.  Continue with current medication regimen of Singulair.  Refills sent today.  Labs ordered today.  Return to clinic in 6 months for reevaluation.  Call sooner if concerns arise.

## 2022-05-09 NOTE — Assessment & Plan Note (Signed)
Labs ordered at visit today.  Will make recommendations based on lab results.   

## 2022-05-09 NOTE — Assessment & Plan Note (Signed)
Chronic.  Controlled.  Continue with current medication regimen.  Still seeing psychiatry.  Caregiver endorses taking medications daily.  Labs ordered today.  Return to clinic in 6 months for reevaluation.  Call sooner if concerns arise.

## 2022-05-10 LAB — LIPID PANEL
Chol/HDL Ratio: 5.9 ratio — ABNORMAL HIGH (ref 0.0–5.0)
Cholesterol, Total: 202 mg/dL — ABNORMAL HIGH (ref 100–199)
HDL: 34 mg/dL — ABNORMAL LOW
LDL Chol Calc (NIH): 124 mg/dL — ABNORMAL HIGH (ref 0–99)
Triglycerides: 246 mg/dL — ABNORMAL HIGH (ref 0–149)
VLDL Cholesterol Cal: 44 mg/dL — ABNORMAL HIGH (ref 5–40)

## 2022-05-10 LAB — CBC WITH DIFFERENTIAL/PLATELET
Basophils Absolute: 0 10*3/uL (ref 0.0–0.2)
Basos: 0 %
EOS (ABSOLUTE): 0.1 10*3/uL (ref 0.0–0.4)
Eos: 1 %
Hematocrit: 43.2 % (ref 37.5–51.0)
Hemoglobin: 14.6 g/dL (ref 13.0–17.7)
Immature Grans (Abs): 0 10*3/uL (ref 0.0–0.1)
Immature Granulocytes: 0 %
Lymphocytes Absolute: 2 10*3/uL (ref 0.7–3.1)
Lymphs: 37 %
MCH: 31.3 pg (ref 26.6–33.0)
MCHC: 33.8 g/dL (ref 31.5–35.7)
MCV: 93 fL (ref 79–97)
Monocytes Absolute: 0.7 10*3/uL (ref 0.1–0.9)
Monocytes: 12 %
Neutrophils Absolute: 2.6 10*3/uL (ref 1.4–7.0)
Neutrophils: 50 %
Platelets: 247 10*3/uL (ref 150–450)
RBC: 4.67 x10E6/uL (ref 4.14–5.80)
RDW: 13.1 % (ref 11.6–15.4)
WBC: 5.3 10*3/uL (ref 3.4–10.8)

## 2022-05-10 LAB — URINALYSIS, ROUTINE W REFLEX MICROSCOPIC
Bilirubin, UA: NEGATIVE
Glucose, UA: NEGATIVE
Ketones, UA: NEGATIVE
Leukocytes,UA: NEGATIVE
Nitrite, UA: NEGATIVE
Protein,UA: NEGATIVE
RBC, UA: NEGATIVE
Specific Gravity, UA: 1.02 (ref 1.005–1.030)
Urobilinogen, Ur: 0.2 mg/dL (ref 0.2–1.0)
pH, UA: 6 (ref 5.0–7.5)

## 2022-05-10 LAB — COMPREHENSIVE METABOLIC PANEL WITH GFR
ALT: 23 [IU]/L (ref 0–44)
AST: 25 [IU]/L (ref 0–40)
Albumin/Globulin Ratio: 1.5 (ref 1.2–2.2)
Albumin: 4.3 g/dL (ref 4.1–5.1)
Alkaline Phosphatase: 54 [IU]/L (ref 44–121)
BUN/Creatinine Ratio: 15 (ref 9–20)
BUN: 11 mg/dL (ref 6–20)
Bilirubin Total: 0.2 mg/dL (ref 0.0–1.2)
CO2: 26 mmol/L (ref 20–29)
Calcium: 9.7 mg/dL (ref 8.7–10.2)
Chloride: 102 mmol/L (ref 96–106)
Creatinine, Ser: 0.75 mg/dL — ABNORMAL LOW (ref 0.76–1.27)
Globulin, Total: 2.9 g/dL (ref 1.5–4.5)
Glucose: 102 mg/dL — ABNORMAL HIGH (ref 70–99)
Potassium: 4.3 mmol/L (ref 3.5–5.2)
Sodium: 139 mmol/L (ref 134–144)
Total Protein: 7.2 g/dL (ref 6.0–8.5)
eGFR: 121 mL/min/{1.73_m2}

## 2022-05-10 LAB — TSH: TSH: 1.12 u[IU]/mL (ref 0.450–4.500)

## 2022-05-10 LAB — VITAMIN D 25 HYDROXY (VIT D DEFICIENCY, FRACTURES): Vit D, 25-Hydroxy: 33.9 ng/mL (ref 30.0–100.0)

## 2022-05-10 NOTE — Progress Notes (Signed)
Please let patient's caregiver know that his lab work shows that his cholesterol is elevated from prior.  I recommend a low fat diet and exercise.    Otherwise, his lab work looks good.  No concerns at this time. Continue with current medication regimen.  Follow up as discussed.

## 2022-05-18 DIAGNOSIS — L309 Dermatitis, unspecified: Secondary | ICD-10-CM | POA: Diagnosis not present

## 2022-08-29 ENCOUNTER — Other Ambulatory Visit: Payer: Self-pay | Admitting: Nurse Practitioner

## 2022-08-29 DIAGNOSIS — J3089 Other allergic rhinitis: Secondary | ICD-10-CM

## 2022-08-29 NOTE — Telephone Encounter (Signed)
Requested by interface surescripts. Last labs 05/09/22.  Requested Prescriptions  Pending Prescriptions Disp Refills   ALLERGY RELIEF 10 MG tablet [Pharmacy Med Name: Allergy Relief 10 MG Tablet] 7 tablet 10    Sig: TAKE 1 TABLET BY MOUTH ONCE DAILY     Ear, Nose, and Throat:  Antihistamines 2 Failed - 08/29/2022  5:00 PM      Failed - Cr in normal range and within 360 days    Creat  Date Value Ref Range Status  05/08/2019 0.78 0.60 - 1.35 mg/dL Final   Creatinine, Ser  Date Value Ref Range Status  05/09/2022 0.75 (L) 0.76 - 1.27 mg/dL Final         Passed - Valid encounter within last 12 months    Recent Outpatient Visits           3 months ago Encounter for annual wellness exam in Medicare patient   [place] [facility] Larae Grooms, NP   1 year ago Benign essential hypertension   [place] [facility] Larae Grooms, NP   1 year ago Cough headache   [place] [facility] Vigg, Avanti, MD   2 years ago Benign essential hypertension   [place] [facility] McElwee, Jake Church, NP   2 years ago Encounter for completion of form with patient   [place] [facility] Marjie Skiff, NP

## 2022-08-29 NOTE — Telephone Encounter (Signed)
Called mom, [name], for more information on medication refill request. Is this Singular or another medication.

## 2022-08-30 ENCOUNTER — Telehealth: Payer: Self-pay | Admitting: Nurse Practitioner

## 2022-10-31 ENCOUNTER — Other Ambulatory Visit: Payer: Self-pay | Admitting: Nurse Practitioner

## 2022-10-31 DIAGNOSIS — I10 Essential (primary) hypertension: Secondary | ICD-10-CM

## 2022-10-31 DIAGNOSIS — J3089 Other allergic rhinitis: Secondary | ICD-10-CM

## 2022-11-01 NOTE — Telephone Encounter (Signed)
Requested Prescriptions  Pending Prescriptions Disp Refills   amLODipine (NORVASC) 2.5 MG tablet [Pharmacy Med Name: amLODIPine Besylate 2.5 MG Tablet] 30 tablet 0    Sig: TAKE 1 TABLET BY MOUTH ONCE DAILY     Cardiovascular: Calcium Channel Blockers 2 Passed - 10/31/2022  4:38 PM      Passed - Last BP in normal range    BP Readings from Last 1 Encounters:  05/09/22 136/81         Passed - Last Heart Rate in normal range    Pulse Readings from Last 1 Encounters:  05/09/22 (!) 105         Passed - Valid encounter within last 6 months    Recent Outpatient Visits           5 months ago Encounter for annual wellness exam in Medicare patient   [place] [facility] [place], Clydie Braun, NP   1 year ago Benign essential hypertension   [place] [facility] Larae Grooms, NP   1 year ago Cough headache   [place] [facility] Vigg, Avanti, MD   2 years ago Benign essential hypertension   [place] [facility] [place], Lauren A, NP   2 years ago Encounter for completion of form with patient   [place] [facility] [place], Jolene T, NP               lisinopril (ZESTRIL) 20 MG tablet [Pharmacy Med Name: Lisinopril 20 MG Tablet] 30 tablet 0    Sig: TAKE 1 TABLET BY MOUTH ONCE DAILY     Cardiovascular:  ACE Inhibitors Failed - 10/31/2022  4:38 PM      Failed - Cr in normal range and within 180 days    Creat  Date Value Ref Range Status  05/08/2019 0.78 0.60 - 1.35 mg/dL Final   Creatinine, Ser  Date Value Ref Range Status  05/09/2022 0.75 (L) 0.76 - 1.27 mg/dL Final         Passed - K in normal range and within 180 days    Potassium  Date Value Ref Range Status  05/09/2022 4.3 3.5 - 5.2 mmol/L Final  11/15/2012 3.9 3.5 - 5.1 mmol/L Final         Passed - Patient is not pregnant      Passed - Last BP in normal range    BP Readings from Last 1 Encounters:  05/09/22 136/81         Passed -  Valid encounter within last 6 months    Recent Outpatient Visits           5 months ago Encounter for annual wellness exam in Medicare patient   [place] [facility] Larae Grooms, NP   1 year ago Benign essential hypertension   [place] [facility] Larae Grooms, NP   1 year ago Cough headache   [place] [facility] Vigg, Avanti, MD   2 years ago Benign essential hypertension   [place] [facility] McElwee, Jake Church, NP   2 years ago Encounter for completion of form with patient    [facility] [place], Jolene T, NP               montelukast (SINGULAIR) 10 MG tablet [Pharmacy Med Name: Montelukast Sodium 10 MG Tablet] 90 tablet 1    Sig: TAKE 1 TABLET BY MOUTH EVERY NIGHT AT BEDTIME  Pulmonology:  Leukotriene Inhibitors Passed - 10/31/2022  4:38 PM      Passed - Valid encounter within last 12 months    Recent Outpatient Visits           5 months ago Encounter for annual wellness exam in Medicare patient   [place] [facility] Larae Grooms, NP   1 year ago Benign essential hypertension   [place] [facility] Larae Grooms, NP   1 year ago Cough headache   [place] [facility] Vigg, Avanti, MD   2 years ago Benign essential hypertension   [place] [facility] McElwee, Jake Church, NP   2 years ago Encounter for completion of form with patient   [place] [facility] Marjie Skiff, NP

## 2023-01-02 ENCOUNTER — Other Ambulatory Visit: Payer: Self-pay | Admitting: Nurse Practitioner

## 2023-01-02 DIAGNOSIS — I10 Essential (primary) hypertension: Secondary | ICD-10-CM

## 2023-01-03 ENCOUNTER — Encounter: Payer: Self-pay | Admitting: *Deleted

## 2023-01-03 NOTE — Telephone Encounter (Signed)
Patient is overdue for an appointment. Please call to schedule and then route to provider for refill.  

## 2023-01-03 NOTE — Telephone Encounter (Signed)
Requested medication (s) are due for refill today:   Yes for both  Requested medication (s) are on the active medication list:   Yes for both  Future visit scheduled:   No.    A MyChart message has been sent requesting him to call in for an appt for his medication refills.   Courtesy refills have already been given in June 2024.   Last ordered: amlodipine and lisinopril 11/01/2022 #30, 0 refills  Labs and an OV are due   Requested Prescriptions  Pending Prescriptions Disp Refills   amLODipine (NORVASC) 2.5 MG tablet [Pharmacy Med Name: amLODIPine Besylate 2.5 MG Tablet] 3 tablet 10    Sig: TAKE 1 TABLET BY MOUTH ONCE DAILY     Cardiovascular: Calcium Channel Blockers 2 Failed - 01/02/2023  4:59 PM      Failed - Valid encounter within last 6 months    Recent Outpatient Visits           7 months ago Encounter for annual wellness exam in Medicare patient   [place] [facility] Larae Grooms, NP   1 year ago Benign essential hypertension   [place] [facility] Larae Grooms, NP   2 years ago Cough headache   [place] [facility] Vigg, Avanti, MD   2 years ago Benign essential hypertension   [place] [facility] [place], Lauren A, NP   2 years ago Encounter for completion of form with patient   [place] [facility] [place], [place] T, NP              Passed - Last BP in normal range    BP Readings from Last 1 Encounters:  05/09/22 136/81         Passed - Last Heart Rate in normal range    Pulse Readings from Last 1 Encounters:  05/09/22 (!) 105          lisinopril (ZESTRIL) 20 MG tablet [Pharmacy Med Name: Lisinopril 20 MG Tablet] 3 tablet 10    Sig: TAKE 1 TABLET BY MOUTH ONCE DAILY     Cardiovascular:  ACE Inhibitors Failed - 01/02/2023  4:59 PM      Failed - Cr in normal range and within 180 days    Creat  Date Value Ref Range Status  05/08/2019 0.78 0.60 - 1.35 mg/dL Final    Creatinine, Ser  Date Value Ref Range Status  05/09/2022 0.75 (L) 0.76 - 1.27 mg/dL Final         Failed - K in normal range and within 180 days    Potassium  Date Value Ref Range Status  05/09/2022 4.3 3.5 - 5.2 mmol/L Final  11/15/2012 3.9 3.5 - 5.1 mmol/L Final         Failed - Valid encounter within last 6 months    Recent Outpatient Visits           7 months ago Encounter for annual wellness exam in Medicare patient   [place] [facility] Larae Grooms, NP   1 year ago Benign essential hypertension   [place] [facility] Larae Grooms, NP   2 years ago Cough headache   [place] [facility] Vigg, Avanti, MD   2 years ago Benign essential hypertension   [place] [facility] McElwee, Jake Church, NP   2 years ago Encounter for completion of form with patient    [facility] [place], Wilson  T, NP              Passed - Patient is not pregnant      Passed - Last BP in normal range    BP Readings from Last 1 Encounters:  05/09/22 136/81

## 2023-01-09 NOTE — Telephone Encounter (Signed)
Scheduled patient a follow up appointment on 01/22/2023 @ 10:40 am.

## 2023-01-22 ENCOUNTER — Ambulatory Visit (INDEPENDENT_AMBULATORY_CARE_PROVIDER_SITE_OTHER): Payer: 59 | Admitting: Physician Assistant

## 2023-01-22 ENCOUNTER — Ambulatory Visit: Payer: 59 | Admitting: Physician Assistant

## 2023-01-22 VITALS — BP 122/87 | HR 94 | Wt 150.2 lb

## 2023-01-22 DIAGNOSIS — F2081 Schizophreniform disorder: Secondary | ICD-10-CM | POA: Diagnosis not present

## 2023-01-22 DIAGNOSIS — Z23 Encounter for immunization: Secondary | ICD-10-CM | POA: Diagnosis not present

## 2023-01-22 DIAGNOSIS — I10 Essential (primary) hypertension: Secondary | ICD-10-CM | POA: Diagnosis not present

## 2023-01-22 DIAGNOSIS — E785 Hyperlipidemia, unspecified: Secondary | ICD-10-CM | POA: Diagnosis not present

## 2023-01-22 MED ORDER — LISINOPRIL 20 MG PO TABS
20.0000 mg | ORAL_TABLET | Freq: Every day | ORAL | 1 refills | Status: DC
Start: 2023-01-22 — End: 2023-05-10

## 2023-01-22 MED ORDER — LISINOPRIL 20 MG PO TABS: 20.0000 mg | ORAL_TABLET | Freq: Every day | ORAL | 0 refills | Status: DC

## 2023-01-22 MED ORDER — AMLODIPINE BESYLATE 2.5 MG PO TABS
2.5000 mg | ORAL_TABLET | Freq: Every day | ORAL | 1 refills | Status: DC
Start: 2023-01-22 — End: 2023-05-10

## 2023-01-22 MED ORDER — AMLODIPINE BESYLATE 2.5 MG PO TABS: 2.5000 mg | ORAL_TABLET | Freq: Every day | ORAL | 0 refills | Status: DC

## 2023-01-22 NOTE — Progress Notes (Signed)
Established Patient Office Visit  Name: David Santos   MRN: [id number]    DOB: 1985/08/18   Date:01/22/2023  Today's Provider: Jacquelin Hawking, MHS, PA-C Introduced myself to the patient as a PA-C and provided education on APPs in clinical practice.         Subjective  Chief Complaint  Chief Complaint  Patient presents with   Hyperlipidemia   Hypertension   Medication Refill   He is here with his caregiver and they do not report any new concerns   HPI  HYPERTENSION / HYPERLIPIDEMIA Satisfied with current treatment? yes Duration of hypertension: years BP monitoring frequency: monthly BP range:  BP medication side effects: no Past BP meds:  Duration of hyperlipidemia: chronic Cholesterol medication side effects: no Cholesterol supplements: none Past cholesterol medications: none  Medication compliance: good compliance Aspirin: no Recent stressors: no Recurrent headaches: no Visual changes: no Palpitations: no Dyspnea: no Chest pain: no Lower extremity edema: no Dizzy/lightheaded: no   Schizophreniform disorder/ Autism He is followed by [facility] in [place]  They are managing his psychiatric medications and lab testing for med levels      Patient Active Problem List   Diagnosis Date Noted   Catat[place] 04/10/2016   Schizophreniform disorder (HCC) 10/18/2015   Autism spectrum disorder    Dyslipidemia 06/28/2015   Active autistic disorder 10/25/2014   Hypertension 10/25/2014   Chronic insomnia 10/25/2014   History of acute renal failure 10/25/2014   Dysmetabolic syndrome 10/25/2014   Moderate intellectual disability 10/25/2014   Perennial allergic rhinitis 10/25/2014   Obese 10/25/2014   General psychoses (HCC) 10/25/2014   Seborrhea capitis 10/25/2014   Personal history of urethral stricture 10/25/2014   Vitamin D deficiency 01/15/2009   Testicular hypofunction 07/17/2008    Past Surgical History:  Procedure Laterality  Date   TONSILLECTOMY AND ADENOIDECTOMY      Family History  Problem Relation Age of Onset   Hypertension Mother    Graves' disease Mother    Breast cancer Mother    Mental illness Father     Social History   Tobacco Use   Smoking status: Never   Smokeless tobacco: Never  Substance Use Topics   Alcohol use: No    Alcohol/week: 0.0 standard drinks of alcohol     Current Outpatient Medications:    ALLERGY RELIEF 10 MG tablet, TAKE 1 TABLET BY MOUTH ONCE DAILY, Disp: 7 tablet, Rfl: 10   calcium-vitamin D (OSCAL WITH D) 500-200 MG-UNIT tablet, Take 1 tablet by mouth daily with breakfast., Disp: 30 tablet, Rfl: 2   divalproex (DEPAKOTE ER) 500 MG 24 hr tablet, Take 500 mg by mouth at bedtime., Disp: , Rfl:    fexofenadine (ALLEGRA ALLERGY) 180 MG tablet, Take 1 tablet (180 mg total) by mouth daily., Disp: 10 tablet, Rfl: 1   haloperidol (HALDOL) 10 MG tablet, Take 10 mg by mouth 3 (three) times daily. , Disp: , Rfl:    montelukast (SINGULAIR) 10 MG tablet, TAKE 1 TABLET BY MOUTH EVERY NIGHT AT BEDTIME, Disp: 90 tablet, Rfl: 1   risperiDONE (RISPERDAL) 3 MG tablet, Take 6 mg by mouth at bedtime. , Disp: , Rfl:    amLODipine (NORVASC) 2.5 MG tablet, Take 1 tablet (2.5 mg total) by mouth daily., Disp: 90 tablet, Rfl: 1   lisinopril (ZESTRIL) 20 MG tablet, Take 1 tablet (20 mg total) by mouth daily., Disp: 90 tablet, Rfl: 1  Allergies  Allergen Reactions  Hydrochlorothiazide     Other reaction(s): Other (See Comments) Acute renal failure    I personally reviewed active problem list, medication list, allergies, health maintenance, notes from last encounter, lab results with the patient/caregiver today.   ROS    Objective  Vitals:   01/22/23 1317  BP: 122/87  Pulse: 94  SpO2: 98%  Weight: 150 lb 3.2 oz (68.1 kg)    Body mass index is 24.99 kg/m.  Physical Exam Vitals reviewed.  Constitutional:      General: He is awake.     Appearance: Normal appearance. He is  well-developed and well-groomed.  HENT:     Head: Normocephalic and atraumatic.  Cardiovascular:     Rate and Rhythm: Normal rate and regular rhythm.     Pulses: Normal pulses.          Radial pulses are 2+ on the right side and 2+ on the left side.       Posterior tibial pulses are 2+ on the right side and 2+ on the left side.     Heart sounds: Normal heart sounds. No murmur heard.    No friction rub. No gallop.  Pulmonary:     Effort: Pulmonary effort is normal.     Breath sounds: Normal breath sounds. No decreased air movement. No decreased breath sounds, wheezing, rhonchi or rales.  Musculoskeletal:     Cervical back: Normal range of motion.     Right lower leg: No edema.     Left lower leg: No edema.  Neurological:     Mental Status: He is alert.  Psychiatric:        Behavior: Behavior is cooperative.      No results found for this or any previous visit (from the past 2160 hour(s)).   PHQ2/9:    01/22/2023    1:24 PM 05/09/2022    8:32 AM 05/06/2021    8:07 AM 08/03/2020    8:25 AM 05/05/2020   10:28 AM  Depression screen PHQ 2/9  Decreased Interest 0 0 0 0 0  Down, Depressed, Hopeless 0 0 0 0 0  PHQ - 2 Score 0 0 0 0 0  Altered sleeping 0 0 0 0   Tired, decreased energy 0 0 0 0   Change in appetite 0 0 0 0   Feeling bad or failure about yourself  0 0 0 0   Trouble concentrating 0 0 0 0   Moving slowly or fidgety/restless 0 0 0 0   Suicidal thoughts 0 0 0 0   PHQ-9 Score 0 0 0 0   Difficult doing work/chores Not difficult at all Not difficult at all Not difficult at all        Fall Risk:    01/22/2023    1:23 PM 05/09/2022    8:31 AM 05/09/2022    8:25 AM 05/06/2021    8:07 AM 12/10/2020    9:07 AM  Fall Risk   Falls in the past year? 0 0 0 0 0  Number falls in past yr: 0 0 0 0 0  Injury with Fall? 0 0 0 0 0  Risk for fall due to : No Fall Risks No Fall Risks No Fall Risks No Fall Risks No Fall Risks  Follow up Falls evaluation completed Falls evaluation  completed Falls evaluation completed Falls evaluation completed Falls evaluation completed      Functional Status Survey:      Assessment & Plan  Problem List Items Addressed  This Visit       Cardiovascular and Mediastinum   Hypertension - Primary    Chronic, historic condition Appears well controlled on current regimen comprised of Lisinopril 20 mg PO every day and Amlodipine 2.5 mg PO every day Continue current regimen, refills provided today Follow up in 6 months or sooner if concerns arise        Relevant Medications   amLODipine (NORVASC) 2.5 MG tablet   lisinopril (ZESTRIL) 20 MG tablet   Other Relevant Orders   Comp Met (CMET)   CBC w/Diff   HgB A1c     Other   Dyslipidemia    Chronic, historic condition Recheck Lipid panel today for monitoring Patient not currently taking medication - may need to add for further protection given his psychiatric medications. Results of lipid panel to dictate further management Follow up in 6 months or sooner if concerns arise        Relevant Orders   Lipid Profile   HgB A1c   Schizophreniform disorder (HCC)    Chronic, historic condition He is seen by [facility] in [place] per caregiver  They appear to be managing Depakote, Risperidal and Haldol along with appropriate labs for monitoring Will defer to their recommendations Follow up in 6 months or sooner if concerns arise        Other Visit Diagnoses     Flu vaccine need       Relevant Orders   Flu vaccine trivalent PF, 6mos and older(Flulaval,Afluria,Fluarix,Fluzone) (Completed)        Return in about 6 months (around 07/22/2023) for HTN, HLD, Annual physical.   I, Emily Massar E Machele Deihl, PA-C, have reviewed all documentation for this visit. The documentation on 01/22/23 for the exam, diagnosis, procedures, and orders are all accurate and complete.   Jacquelin Hawking, MHS, PA-C [facility]

## 2023-01-22 NOTE — Assessment & Plan Note (Signed)
Chronic, historic condition He is seen by [facility] in [place] per caregiver  They appear to be managing Depakote, Risperidal and Haldol along with appropriate labs for monitoring Will defer to their recommendations Follow up in 6 months or sooner if concerns arise

## 2023-01-22 NOTE — Assessment & Plan Note (Signed)
Chronic, historic condition Appears well controlled on current regimen comprised of Lisinopril 20 mg PO every day and Amlodipine 2.5 mg PO every day Continue current regimen, refills provided today Follow up in 6 months or sooner if concerns arise

## 2023-01-22 NOTE — Assessment & Plan Note (Signed)
Chronic, historic condition Recheck Lipid panel today for monitoring Patient not currently taking medication - may need to add for further protection given his psychiatric medications. Results of lipid panel to dictate further management Follow up in 6 months or sooner if concerns arise

## 2023-01-23 LAB — CBC WITH DIFFERENTIAL/PLATELET
Basophils Absolute: 0 10*3/uL (ref 0.0–0.2)
Basos: 1 %
EOS (ABSOLUTE): 0.1 10*3/uL (ref 0.0–0.4)
Eos: 1 %
Hematocrit: 42.8 % (ref 37.5–51.0)
Hemoglobin: 14.3 g/dL (ref 13.0–17.7)
Immature Grans (Abs): 0 10*3/uL (ref 0.0–0.1)
Immature Granulocytes: 0 %
Lymphocytes Absolute: 2.3 10*3/uL (ref 0.7–3.1)
Lymphs: 39 %
MCH: 31.8 pg (ref 26.6–33.0)
MCHC: 33.4 g/dL (ref 31.5–35.7)
MCV: 95 fL (ref 79–97)
Monocytes Absolute: 0.7 10*3/uL (ref 0.1–0.9)
Monocytes: 12 %
Neutrophils Absolute: 2.8 10*3/uL (ref 1.4–7.0)
Neutrophils: 47 %
Platelets: 196 10*3/uL (ref 150–450)
RBC: 4.5 x10E6/uL (ref 4.14–5.80)
RDW: 13.1 % (ref 11.6–15.4)
WBC: 5.8 10*3/uL (ref 3.4–10.8)

## 2023-01-23 LAB — COMPREHENSIVE METABOLIC PANEL
ALT: 17 IU/L (ref 0–44)
AST: 21 IU/L (ref 0–40)
Albumin: 4.4 g/dL (ref 4.1–5.1)
Alkaline Phosphatase: 55 IU/L (ref 44–121)
BUN/Creatinine Ratio: 8 — ABNORMAL LOW (ref 9–20)
BUN: 6 mg/dL (ref 6–20)
Bilirubin Total: 0.3 mg/dL (ref 0.0–1.2)
CO2: 26 mmol/L (ref 20–29)
Calcium: 9.8 mg/dL (ref 8.7–10.2)
Chloride: 104 mmol/L (ref 96–106)
Creatinine, Ser: 0.77 mg/dL (ref 0.76–1.27)
Globulin, Total: 2.6 g/dL (ref 1.5–4.5)
Glucose: 105 mg/dL — ABNORMAL HIGH (ref 70–99)
Potassium: 4.1 mmol/L (ref 3.5–5.2)
Sodium: 142 mmol/L (ref 134–144)
Total Protein: 7 g/dL (ref 6.0–8.5)
eGFR: 120 mL/min/{1.73_m2} (ref >59)

## 2023-01-23 LAB — LIPID PANEL
Chol/HDL Ratio: 3.8 ratio (ref 0.0–5.0)
Cholesterol, Total: 165 mg/dL (ref 100–199)
HDL: 43 mg/dL (ref >39)
LDL Chol Calc (NIH): 78 mg/dL (ref 0–99)
Triglycerides: 271 mg/dL — ABNORMAL HIGH (ref 0–149)
VLDL Cholesterol Cal: 44 mg/dL — ABNORMAL HIGH (ref 5–40)

## 2023-01-23 LAB — HEMOGLOBIN A1C
Est. average glucose Bld gHb Est-mCnc: 103 mg/dL
Hgb A1c MFr Bld: 5.2 % (ref 4.8–5.6)

## 2023-01-24 NOTE — Progress Notes (Signed)
Your labs are back Your A1c was 5.2 which is normal  Your CBC was normal- no signs of anemia  Your electrolytes, liver and kidney function were overall normal at this time Your cholesterol was pretty normal with the exception of your triglycerides. This can be elevated if you were not fasting prior to labs and we can monitor this for now. Please continue with your current medications for now

## 2023-04-18 DIAGNOSIS — H5213 Myopia, bilateral: Secondary | ICD-10-CM | POA: Diagnosis not present

## 2023-04-18 DIAGNOSIS — H52223 Regular astigmatism, bilateral: Secondary | ICD-10-CM | POA: Diagnosis not present

## 2023-05-10 ENCOUNTER — Ambulatory Visit: Payer: 59 | Admitting: Nurse Practitioner

## 2023-05-10 ENCOUNTER — Encounter: Payer: Self-pay | Admitting: Nurse Practitioner

## 2023-05-10 VITALS — BP 126/85 | HR 107 | Temp 98.4°F | Ht 65.0 in | Wt 152.6 lb

## 2023-05-10 DIAGNOSIS — Z0000 Encounter for general adult medical examination without abnormal findings: Secondary | ICD-10-CM | POA: Diagnosis not present

## 2023-05-10 DIAGNOSIS — F2081 Schizophreniform disorder: Secondary | ICD-10-CM

## 2023-05-10 DIAGNOSIS — R7303 Prediabetes: Secondary | ICD-10-CM

## 2023-05-10 DIAGNOSIS — E785 Hyperlipidemia, unspecified: Secondary | ICD-10-CM | POA: Diagnosis not present

## 2023-05-10 DIAGNOSIS — I10 Essential (primary) hypertension: Secondary | ICD-10-CM | POA: Diagnosis not present

## 2023-05-10 MED ORDER — AMLODIPINE BESYLATE 2.5 MG PO TABS: 2.5000 mg | ORAL_TABLET | Freq: Every day | ORAL | 1 refills | Status: DC

## 2023-05-10 MED ORDER — LISINOPRIL 20 MG PO TABS
20.0000 mg | ORAL_TABLET | Freq: Every day | ORAL | 1 refills | Status: DC
Start: 2023-05-10 — End: 2023-11-06

## 2023-05-10 NOTE — Progress Notes (Deleted)
 There were no vitals taken for this visit.   Subjective:    Patient ID: David Santos, male    DOB: June 24, 1985, 38 y.o.   MRN: [id number]  HPI: David Santos is a 38 y.o. male  No chief complaint on file.   Relevant past medical, surgical, family and social history reviewed and updated as indicated. Interim medical history since our last visit reviewed. Allergies and medications reviewed and updated.  Review of Systems  Per HPI unless specifically indicated above     Objective:    There were no vitals taken for this visit.  Wt Readings from Last 3 Encounters:  01/22/23 150 lb 3.2 oz (68.1 kg)  05/09/22 159 lb 14.4 oz (72.5 kg)  05/06/21 160 lb 6.4 oz (72.8 kg)    Physical Exam  Results for orders placed or performed in visit on 01/22/23  Lipid Profile   Collection Time: 01/22/23  1:52 PM  Result Value Ref Range   Cholesterol, Total 165 100 - 199 mg/dL   Triglycerides 728 (H) 0 - 149 mg/dL   HDL 43 >60 mg/dL   VLDL Cholesterol Cal 44 (H) 5 - 40 mg/dL   LDL Chol Calc (NIH) 78 0 - 99 mg/dL   Chol/HDL Ratio 3.8 0.0 - 5.0 ratio  Comp Met (CMET)   Collection Time: 01/22/23  1:52 PM  Result Value Ref Range   Glucose 105 (H) 70 - 99 mg/dL   BUN 6 6 - 20 mg/dL   Creatinine, Ser 9.22 0.76 - 1.27 mg/dL   eGFR 879 >40 fO/fpw/8.26   BUN/Creatinine Ratio 8 (L) 9 - 20   Sodium 142 134 - 144 mmol/L   Potassium 4.1 3.5 - 5.2 mmol/L   Chloride 104 96 - 106 mmol/L   CO2 26 20 - 29 mmol/L   Calcium  9.8 8.7 - 10.2 mg/dL   Total Protein 7.0 6.0 - 8.5 g/dL   Albumin 4.4 4.1 - 5.1 g/dL   Globulin, Total 2.6 1.5 - 4.5 g/dL   Bilirubin Total 0.3 0.0 - 1.2 mg/dL   Alkaline Phosphatase 55 44 - 121 IU/L   AST 21 0 - 40 IU/L   ALT 17 0 - 44 IU/L  CBC w/Diff   Collection Time: 01/22/23  1:52 PM  Result Value Ref Range   WBC 5.8 3.4 - 10.8 x10E3/uL   RBC 4.50 4.14 - 5.80 x10E6/uL   Hemoglobin 14.3 13.0 - 17.7 g/dL   Hematocrit 57.1 62.4 - 51.0 %   MCV 95 79 - 97 fL   MCH  31.8 26.6 - 33.0 pg   MCHC 33.4 31.5 - 35.7 g/dL   RDW 86.8 88.3 - 84.5 %   Platelets 196 150 - 450 x10E3/uL   Neutrophils 47 Not Estab. %   Lymphs 39 Not Estab. %   Monocytes 12 Not Estab. %   Eos 1 Not Estab. %   Basos 1 Not Estab. %   Neutrophils Absolute 2.8 1.4 - 7.0 x10E3/uL   Lymphocytes Absolute 2.3 0.7 - 3.1 x10E3/uL   Monocytes Absolute 0.7 0.1 - 0.9 x10E3/uL   EOS (ABSOLUTE) 0.1 0.0 - 0.4 x10E3/uL   Basophils Absolute 0.0 0.0 - 0.2 x10E3/uL   Immature Granulocytes 0 Not Estab. %   Immature Grans (Abs) 0.0 0.0 - 0.1 x10E3/uL  HgB A1c   Collection Time: 01/22/23  1:52 PM  Result Value Ref Range   Hgb A1c MFr Bld 5.2 4.8 - 5.6 %   Est. average glucose Bld  gHb Est-mCnc 103 mg/dL      Assessment & Plan:   Problem List Items Addressed This Visit   None    Follow up plan: No follow-ups on file.

## 2023-05-10 NOTE — Assessment & Plan Note (Signed)
 Chronic, Controlled. Recheck Lipid panel today for monitoring Patient not currently taking medication - may need to add for further protection given his psychiatric medications. Follow up in 6 months or sooner if concerns arise

## 2023-05-10 NOTE — Assessment & Plan Note (Signed)
 Chronic.  Controlled.  Continue with current medication regimen of Lisinopril 20mg  and Amlodipine 2.5mg  daily.  Refills sent today.  Labs ordered today.  Return to clinic in 6 months for reevaluation.  Call sooner if concerns arise.

## 2023-05-10 NOTE — Progress Notes (Signed)
 BP 126/85 (BP Location: Right Arm, Patient Position: Sitting, Cuff Size: Normal)   Pulse (!) 107   Temp 98.4 F (36.9 C) (Oral)   Ht 5' 5 (1.651 m)   Wt 152 lb 9.6 oz (69.2 kg)   SpO2 97%   BMI 25.39 kg/m    Subjective:    Patient ID: David Santos, male    DOB: 08-09-1985, 38 y.o.   MRN: [id number]  HPI: David Santos is a 38 y.o. male presenting on 05/10/2023 for comprehensive medical examination. Current medical complaints include: needs FL2 form filled out.  He currently lives with: Interim Problems from his last visit: no  HYPERTENSION Hypertension status: controlled  Satisfied with current treatment? no Duration of hypertension: years BP monitoring frequency:  weekly BP range: 101/80 BP medication side effects:  no Medication compliance: excellent compliance Previous BP meds:amlodipine  and lisinopril  Aspirin: no Recurrent headaches: no Visual changes: no Palpitations: no Dyspnea: no Chest pain: no Lower extremity edema: no Dizzy/lightheaded: no   Functional Status Survey: Is the patient deaf or have difficulty hearing?: No Does the patient have difficulty seeing, even when wearing glasses/contacts?: No Does the patient have difficulty concentrating, remembering, or making decisions?: No Does the patient have difficulty walking or climbing stairs?: No Does the patient have difficulty dressing or bathing?: No Does the patient have difficulty doing errands alone such as visiting a doctor's office or shopping?: Yes  FALL RISK:    01/22/2023    1:23 PM 05/09/2022    8:31 AM 05/09/2022    8:25 AM 05/06/2021    8:07 AM 12/10/2020    9:07 AM  Fall Risk   Falls in the past year? 0 0 0 0 0  Number falls in past yr: 0 0 0 0 0  Injury with Fall? 0 0 0 0 0  Risk for fall due to : No Fall Risks No Fall Risks No Fall Risks No Fall Risks No Fall Risks  Follow up Falls evaluation completed Falls evaluation completed Falls evaluation completed Falls evaluation  completed Falls evaluation completed    Depression Screen    01/22/2023    1:24 PM 05/09/2022    8:32 AM 05/06/2021    8:07 AM 08/03/2020    8:25 AM 05/05/2020   10:28 AM  Depression screen PHQ 2/9  Decreased Interest 0 0 0 0 0  Down, Depressed, Hopeless 0 0 0 0 0  PHQ - 2 Score 0 0 0 0 0  Altered sleeping 0 0 0 0   Tired, decreased energy 0 0 0 0   Change in appetite 0 0 0 0   Feeling bad or failure about yourself  0 0 0 0   Trouble concentrating 0 0 0 0   Moving slowly or fidgety/restless 0 0 0 0   Suicidal thoughts 0 0 0 0   PHQ-9 Score 0 0 0 0   Difficult doing work/chores Not difficult at all Not difficult at all Not difficult at all      Advanced Directives Discussed at visit today.  Does not have one at this time.   Past Medical History:  Past Medical History:  Diagnosis Date   Allergy    Autism    Hyperlipidemia    Hypertension    Psychosis (HCC)    Vitamin D  deficiency     Surgical History:  Past Surgical History:  Procedure Laterality Date   TONSILLECTOMY AND ADENOIDECTOMY      Medications:  Current Outpatient Medications  on File Prior to Visit  Medication Sig   ALLERGY RELIEF 10 MG tablet TAKE 1 TABLET BY MOUTH ONCE DAILY   calcium -vitamin D  (OSCAL WITH D) 500-200 MG-UNIT tablet Take 1 tablet by mouth daily with breakfast.   divalproex  (DEPAKOTE  ER) 500 MG 24 hr tablet Take 500 mg by mouth at bedtime.   fexofenadine  (ALLEGRA  ALLERGY) 180 MG tablet Take 1 tablet (180 mg total) by mouth daily.   haloperidol (HALDOL) 10 MG tablet Take 10 mg by mouth 3 (three) times daily.    montelukast  (SINGULAIR ) 10 MG tablet TAKE 1 TABLET BY MOUTH EVERY NIGHT AT BEDTIME   risperiDONE  (RISPERDAL ) 3 MG tablet Take 6 mg by mouth at bedtime.    No current facility-administered medications on file prior to visit.    Allergies:  Allergies  Allergen Reactions   Hydrochlorothiazide     Other reaction(s): Other (See Comments) Acute renal failure    Social History:   Social History   Socioeconomic History   Marital status: Single    Spouse name: Not on file   Number of children: 0   Years of education: Not on file   Highest education level: Not on file  Occupational History   Not on file  Tobacco Use   Smoking status: Never   Smokeless tobacco: Never  Vaping Use   Vaping status: Never Used  Substance and Sexual Activity   Alcohol use: No    Alcohol/week: 0.0 standard drinks of alcohol   Drug use: No   Sexual activity: Never  Other Topics Concern   Not on file  Social History Narrative   Lives in a group home - [facility]    Social Drivers of Health   Financial Resource Strain: Low Risk  (04/26/2017)   Overall Financial Resource Strain (CARDIA)    Difficulty of Paying Living Expenses: Not very hard  Food Insecurity: No Food Insecurity (04/26/2017)   Hunger Vital Sign    Worried About Running Out of Food in the Last Year: Never true    Ran Out of Food in the Last Year: Never true  Transportation Needs: No Transportation Needs (04/26/2017)   PRAPARE - Administrator, Civil Service (Medical): No    Lack of Transportation (Non-Medical): No  Physical Activity: Inactive (09/14/2017)   Exercise Vital Sign    Days of Exercise per Week: 0 days    Minutes of Exercise per Session: 0 min  Stress: Unknown (04/26/2017)   [employer] of Occupational Health - Occupational Stress Questionnaire    Feeling of Stress : Patient declined  Social Connections: Unknown (05/10/2023)   Social Connection and Isolation Panel [NHANES]    Frequency of Communication with Friends and Family: Not on file    Frequency of Social Gatherings with Friends and Family: Not on file    Attends Religious Services: Not on file    Active Member of Clubs or Organizations: Patient unable to answer    Attends Banker Meetings: Not on file    Marital Status: Not on file  Intimate Partner Violence: Not At Risk (04/26/2017)   Humiliation,  Afraid, Rape, and Kick questionnaire    Fear of Current or Ex-Partner: No    Emotionally Abused: No    Physically Abused: No    Sexually Abused: No   Social History   Tobacco Use  Smoking Status Never  Smokeless Tobacco Never   Social History   Substance and Sexual Activity  Alcohol Use No  Alcohol/week: 0.0 standard drinks of alcohol    Family History:  Family History  Problem Relation Age of Onset   Hypertension Mother    Yvone' disease Mother    Breast cancer Mother    Mental illness Father     Past medical history, surgical history, medications, allergies, family history and social history reviewed with patient today and changes made to appropriate areas of the chart.   Review of Systems  Eyes:  Negative for blurred vision and double vision.  Respiratory:  Negative for shortness of breath.   Cardiovascular:  Negative for chest pain, palpitations and leg swelling.  Neurological:  Negative for dizziness and headaches.   All other ROS negative except what is listed above and in the HPI.      Objective:    BP 126/85 (BP Location: Right Arm, Patient Position: Sitting, Cuff Size: Normal)   Pulse (!) 107   Temp 98.4 F (36.9 C) (Oral)   Ht 5' 5 (1.651 m)   Wt 152 lb 9.6 oz (69.2 kg)   SpO2 97%   BMI 25.39 kg/m   Wt Readings from Last 3 Encounters:  05/10/23 152 lb 9.6 oz (69.2 kg)  01/22/23 150 lb 3.2 oz (68.1 kg)  05/09/22 159 lb 14.4 oz (72.5 kg)    No results found.  Physical Exam Vitals and nursing note reviewed.  Constitutional:      General: He is not in acute distress.    Appearance: Normal appearance. He is not ill-appearing, toxic-appearing or diaphoretic.  HENT:     Head: Normocephalic.     Right Ear: Tympanic membrane, ear canal and external ear normal.     Left Ear: Tympanic membrane, ear canal and external ear normal.     Nose: Nose normal. No congestion or rhinorrhea.     Mouth/Throat:     Mouth: Mucous membranes are moist.  Eyes:      General:        Right eye: No discharge.        Left eye: No discharge.     Extraocular Movements: Extraocular movements intact.     Conjunctiva/sclera: Conjunctivae normal.     Pupils: Pupils are equal, round, and reactive to light.  Cardiovascular:     Rate and Rhythm: Normal rate and regular rhythm.     Heart sounds: No murmur heard. Pulmonary:     Effort: Pulmonary effort is normal. No respiratory distress.     Breath sounds: Normal breath sounds. No wheezing, rhonchi or rales.  Abdominal:     General: Abdomen is flat. Bowel sounds are normal. There is no distension.     Palpations: Abdomen is soft.     Tenderness: There is no abdominal tenderness. There is no guarding.  Musculoskeletal:     Cervical back: Normal range of motion and neck supple.  Skin:    General: Skin is warm and dry.     Capillary Refill: Capillary refill takes less than 2 seconds.  Neurological:     General: No focal deficit present.     Mental Status: He is alert and oriented to person, place, and time.     Cranial Nerves: No cranial nerve deficit.     Motor: No weakness.     Deep Tendon Reflexes: Reflexes normal.  Psychiatric:        Mood and Affect: Mood normal.        Behavior: Behavior normal.        Thought Content: Thought content normal.  Judgment: Judgment normal.        05/10/2023    9:27 AM  6CIT Screen  What Year? 4 points  What month? 3 points  What time? 3 points  Count back from 20 4 points  Months in reverse 4 points  Repeat phrase 10 points  Total Score 28 points    Cognitive Testing - 6-CIT  Correct? Score   What year is it? no 4 Yes = 0    No = 4  What month is it? no 4 Yes = 0    No = 3  Remember:     Norleen Sharps, [address], [place]     What time is it? no 4 Yes = 0    No = 3  Count backwards from 20 to 1 no 4 Correct = 0    1 error = 2   More than 1 error = 4  Say the months of the year in reverse. no 4 Correct = 0    1 error = 2   More than 1 error = 4   What address did I ask you to remember? no 10 Correct = 0  1 error = 2    2 error = 4    3 error = 6    4 error = 8    All wrong = 10       TOTAL SCORE  30/28   Interpretation:  Developmental delay and Schizophrenia  Normal (0-7) Abnormal (8-28)    Results for orders placed or performed in visit on 01/22/23  Lipid Profile   Collection Time: 01/22/23  1:52 PM  Result Value Ref Range   Cholesterol, Total 165 100 - 199 mg/dL   Triglycerides 728 (H) 0 - 149 mg/dL   HDL 43 >60 mg/dL   VLDL Cholesterol Cal 44 (H) 5 - 40 mg/dL   LDL Chol Calc (NIH) 78 0 - 99 mg/dL   Chol/HDL Ratio 3.8 0.0 - 5.0 ratio  Comp Met (CMET)   Collection Time: 01/22/23  1:52 PM  Result Value Ref Range   Glucose 105 (H) 70 - 99 mg/dL   BUN 6 6 - 20 mg/dL   Creatinine, Ser 9.22 0.76 - 1.27 mg/dL   eGFR 879 >40 fO/fpw/8.26   BUN/Creatinine Ratio 8 (L) 9 - 20   Sodium 142 134 - 144 mmol/L   Potassium 4.1 3.5 - 5.2 mmol/L   Chloride 104 96 - 106 mmol/L   CO2 26 20 - 29 mmol/L   Calcium  9.8 8.7 - 10.2 mg/dL   Total Protein 7.0 6.0 - 8.5 g/dL   Albumin 4.4 4.1 - 5.1 g/dL   Globulin, Total 2.6 1.5 - 4.5 g/dL   Bilirubin Total 0.3 0.0 - 1.2 mg/dL   Alkaline Phosphatase 55 44 - 121 IU/L   AST 21 0 - 40 IU/L   ALT 17 0 - 44 IU/L  CBC w/Diff   Collection Time: 01/22/23  1:52 PM  Result Value Ref Range   WBC 5.8 3.4 - 10.8 x10E3/uL   RBC 4.50 4.14 - 5.80 x10E6/uL   Hemoglobin 14.3 13.0 - 17.7 g/dL   Hematocrit 57.1 62.4 - 51.0 %   MCV 95 79 - 97 fL   MCH 31.8 26.6 - 33.0 pg   MCHC 33.4 31.5 - 35.7 g/dL   RDW 86.8 88.3 - 84.5 %   Platelets 196 150 - 450 x10E3/uL   Neutrophils 47 Not Estab. %   Lymphs 39  Not Estab. %   Monocytes 12 Not Estab. %   Eos 1 Not Estab. %   Basos 1 Not Estab. %   Neutrophils Absolute 2.8 1.4 - 7.0 x10E3/uL   Lymphocytes Absolute 2.3 0.7 - 3.1 x10E3/uL   Monocytes Absolute 0.7 0.1 - 0.9 x10E3/uL   EOS (ABSOLUTE) 0.1 0.0 - 0.4 x10E3/uL   Basophils Absolute 0.0 0.0 - 0.2  x10E3/uL   Immature Granulocytes 0 Not Estab. %   Immature Grans (Abs) 0.0 0.0 - 0.1 x10E3/uL  HgB A1c   Collection Time: 01/22/23  1:52 PM  Result Value Ref Range   Hgb A1c MFr Bld 5.2 4.8 - 5.6 %   Est. average glucose Bld gHb Est-mCnc 103 mg/dL      Assessment & Plan:   Problem List Items Addressed This Visit       Cardiovascular and Mediastinum   Hypertension   Chronic.  Controlled.  Continue with current medication regimen of Lisinopril  20mg  and Amlodipine  2.5mg  daily.  Refills sent today.  Labs ordered today.  Return to clinic in 6 months for reevaluation.  Call sooner if concerns arise.        Relevant Medications   amLODipine  (NORVASC ) 2.5 MG tablet   lisinopril  (ZESTRIL ) 20 MG tablet   Other Relevant Orders   Comp Met (CMET)     Other   Dyslipidemia   Chronic, Controlled. Recheck Lipid panel today for monitoring Patient not currently taking medication - may need to add for further protection given his psychiatric medications. Follow up in 6 months or sooner if concerns arise       Relevant Orders   Lipid Profile   Schizophreniform disorder (HCC)   Chronic.  Group home reports patient is doing well.  Continues on Haloperidol, Respirdone, and Depakote .  He is managed by a psychiatrist who comes to the group home.  Continue with current regimen and continue to follow up with specialist.      Prediabetes   Chronic.  Controlled.  Not currently on medication.  Labs ordered today.  Return to clinic in 6 months for reevaluation.  Call sooner if concerns arise.        Relevant Orders   HgB A1c   Other Visit Diagnoses       Encounter for annual wellness exam in Medicare patient    -  Primary         Preventative Services:  Health Risk Assessment and Personalized Prevention Plan: reviewed during visit Bone Mass Measurements: NA CVD Screening: Up to date Colon Cancer Screening: NA Depression Screening: Up to date- followed by psychiatry Diabetes Screening:  Up to date Glaucoma Screening: NA Hepatitis B vaccine: NA Hepatitis C screening: up to date HIV Screening: up to date Flu Vaccine: Up to date Lung cancer Screening: NA Obesity Screening: Up to date Pneumonia Vaccines (2): NA STI Screening: NA PSA screening: NA  Discussed aspirin prophylaxis for myocardial infarction prevention and decision was it was not indicated  LABORATORY TESTING:  Health maintenance labs ordered today as discussed above.     IMMUNIZATIONS:   - Tdap: Tetanus vaccination status reviewed: Not up to date. - Influenza: Up to date - Pneumovax: Not applicable - Prevnar: Not applicable - Zostavax vaccine: Not applicable  SCREENING: - Colonoscopy: Not applicable  Discussed with patient purpose of the colonoscopy is to detect colon cancer at curable precancerous or early stages   - AAA Screening: Not applicable  -Hearing Test: Not applicable  -Spirometry: Not applicable  PATIENT COUNSELING:    Sexuality: Discussed sexually transmitted diseases, partner selection, use of condoms, avoidance of unintended pregnancy  and contraceptive alternatives.   Advised to avoid cigarette smoking.  I discussed with the patient that most people either abstain from alcohol or drink within safe limits (<=14/week and <=4 drinks/occasion for males, <=7/weeks and <= 3 drinks/occasion for females) and that the risk for alcohol disorders and other health effects rises proportionally with the number of drinks per week and how often a drinker exceeds daily limits.  Discussed cessation/primary prevention of drug use and availability of treatment for abuse.   Diet: Encouraged to adjust caloric intake to maintain  or achieve ideal body weight, to reduce intake of dietary saturated fat and total fat, to limit sodium intake by avoiding high sodium foods and not adding table salt, and to maintain adequate dietary potassium and calcium  preferably from fresh fruits, vegetables, and low-fat  dairy products.    stressed the importance of regular exercise  Injury prevention: Discussed safety belts, safety helmets, smoke detector, smoking near bedding or upholstery.   Dental health: Discussed importance of regular tooth brushing, flossing, and dental visits.   Follow up plan: NEXT PREVENTATIVE PHYSICAL DUE IN 1 YEAR. No follow-ups on file.

## 2023-05-10 NOTE — Assessment & Plan Note (Signed)
 Chronic.  Controlled.  Not currently on medication.  Labs ordered today.  Return to clinic in 6 months for reevaluation.  Call sooner if concerns arise.

## 2023-05-10 NOTE — Assessment & Plan Note (Signed)
 Chronic.  Group home reports patient is doing well.  Continues on Haloperidol, Respirdone, and Depakote.  He is managed by a psychiatrist who comes to the group home.  Continue with current regimen and continue to follow up with specialist.

## 2023-05-11 LAB — COMPREHENSIVE METABOLIC PANEL
ALT: 22 [IU]/L (ref 0–44)
AST: 18 [IU]/L (ref 0–40)
Albumin: 4.3 g/dL (ref 4.1–5.1)
Alkaline Phosphatase: 53 [IU]/L (ref 44–121)
BUN/Creatinine Ratio: 14 (ref 9–20)
BUN: 10 mg/dL (ref 6–20)
Bilirubin Total: 0.4 mg/dL (ref 0.0–1.2)
CO2: 22 mmol/L (ref 20–29)
Calcium: 9.5 mg/dL (ref 8.7–10.2)
Chloride: 104 mmol/L (ref 96–106)
Creatinine, Ser: 0.74 mg/dL — ABNORMAL LOW (ref 0.76–1.27)
Globulin, Total: 3.1 g/dL (ref 1.5–4.5)
Glucose: 80 mg/dL (ref 70–99)
Potassium: 4 mmol/L (ref 3.5–5.2)
Sodium: 140 mmol/L (ref 134–144)
Total Protein: 7.4 g/dL (ref 6.0–8.5)
eGFR: 121 mL/min/{1.73_m2} (ref >59)

## 2023-05-11 LAB — LIPID PANEL
Chol/HDL Ratio: 4.6 {ratio} (ref 0.0–5.0)
Cholesterol, Total: 209 mg/dL — ABNORMAL HIGH (ref 100–199)
HDL: 45 mg/dL (ref >39)
LDL Chol Calc (NIH): 137 mg/dL — ABNORMAL HIGH (ref 0–99)
Triglycerides: 150 mg/dL — ABNORMAL HIGH (ref 0–149)
VLDL Cholesterol Cal: 27 mg/dL (ref 5–40)

## 2023-05-11 LAB — HEMOGLOBIN A1C
Est. average glucose Bld gHb Est-mCnc: 105 mg/dL
Hgb A1c MFr Bld: 5.3 % (ref 4.8–5.6)

## 2023-06-21 ENCOUNTER — Telehealth: Payer: Self-pay | Admitting: Nurse Practitioner

## 2023-06-21 NOTE — Telephone Encounter (Signed)
Copied from CRM [id number]. Topic: General - Other >> Jun 21, 2023 10:55 AM Dondra Prader A wrote: Reason for CRM: Pt mom is calling to see if he is up to date for his flu shot. Please call [name] (pt mom back to discuss)

## 2023-06-21 NOTE — Telephone Encounter (Signed)
Called and LVM letting patient's mom know that the patient is up to date on his flu vaccine as he had it 01/22/23.

## 2023-07-20 NOTE — Progress Notes (Signed)
 BP 137/86 (BP Location: Right Arm, Patient Position: Sitting, Cuff Size: Large)   Pulse 92   Ht 5\' 5"  (1.651 m)   Wt 149 lb (67.6 kg)   SpO2 96%   BMI 24.79 kg/m    Subjective:    Patient ID: David Santos, male    DOB: 10/30/1985, 38 y.o.   MRN: [id number]  HPI: David Santos is a 38 y.o. male presenting on 07/23/2023 for comprehensive medical examination. Current medical complaints include:none  He currently lives with: Interim Problems from his last visit: no  HYPERTENSION without Chronic Kidney Disease Hypertension status: controlled  Satisfied with current treatment? yes Duration of hypertension: years BP monitoring frequency:  not checking BP range:  BP medication side effects:  no Medication compliance: excellent compliance Previous BP meds:amlodipine and lisinopril Aspirin: no Recurrent headaches: no Visual changes: no Palpitations: no Dyspnea: no Chest pain: no Lower extremity edema: no Dizzy/lightheaded: no  MOOD Patient is followed by Psychiatry.  He lives in a group home where psychiatrist comes to facility to management patient's medications.  Doing well on current medication regimen.  Follow up as discussed.   Depression Screen done today and results listed below:     01/22/2023    1:24 PM 05/09/2022    8:32 AM 05/06/2021    8:07 AM 08/03/2020    8:25 AM 05/05/2020   10:28 AM  Depression screen PHQ 2/9  Decreased Interest 0 0 0 0 0  Down, Depressed, Hopeless 0 0 0 0 0  PHQ - 2 Score 0 0 0 0 0  Altered sleeping 0 0 0 0   Tired, decreased energy 0 0 0 0   Change in appetite 0 0 0 0   Feeling bad or failure about yourself  0 0 0 0   Trouble concentrating 0 0 0 0   Moving slowly or fidgety/restless 0 0 0 0   Suicidal thoughts 0 0 0 0   PHQ-9 Score 0 0 0 0   Difficult doing work/chores Not difficult at all Not difficult at all Not difficult at all      The patient does not have a history of falls. I did complete a risk assessment for falls. A  plan of care for falls was documented.   Past Medical History:  Past Medical History:  Diagnosis Date   Allergy    Autism    Hyperlipidemia    Hypertension    Psychosis (HCC)    Vitamin D deficiency     Surgical History:  Past Surgical History:  Procedure Laterality Date   TONSILLECTOMY AND ADENOIDECTOMY      Medications:  Current Outpatient Medications on File Prior to Visit  Medication Sig   ALLERGY RELIEF 10 MG tablet TAKE 1 TABLET BY MOUTH ONCE DAILY   amLODipine (NORVASC) 2.5 MG tablet Take 1 tablet (2.5 mg total) by mouth daily.   calcium-vitamin D (OSCAL WITH D) 500-200 MG-UNIT tablet Take 1 tablet by mouth daily with breakfast.   divalproex (DEPAKOTE ER) 500 MG 24 hr tablet Take 500 mg by mouth at bedtime.   fexofenadine (ALLEGRA ALLERGY) 180 MG tablet Take 1 tablet (180 mg total) by mouth daily.   haloperidol (HALDOL) 10 MG tablet Take 10 mg by mouth 3 (three) times daily.    lisinopril (ZESTRIL) 20 MG tablet Take 1 tablet (20 mg total) by mouth daily.   montelukast (SINGULAIR) 10 MG tablet TAKE 1 TABLET BY MOUTH EVERY NIGHT AT BEDTIME   risperiDONE (  RISPERDAL) 3 MG tablet Take 6 mg by mouth at bedtime.    No current facility-administered medications on file prior to visit.    Allergies:  Allergies  Allergen Reactions   Hydrochlorothiazide     Other reaction(s): Other (See Comments) Acute renal failure    Social History:  Social History   Socioeconomic History   Marital status: Single    Spouse name: Not on file   Number of children: 0   Years of education: Not on file   Highest education level: Not on file  Occupational History   Not on file  Tobacco Use   Smoking status: Never   Smokeless tobacco: Never  Vaping Use   Vaping status: Never Used  Substance and Sexual Activity   Alcohol use: No    Alcohol/week: 0.0 standard drinks of alcohol   Drug use: No   Sexual activity: Never  Other Topics Concern   Not on file  Social History Narrative    Lives in a group home - [facility]    Social Drivers of Health   Financial Resource Strain: Low Risk  (04/26/2017)   Overall Financial Resource Strain (CARDIA)    Difficulty of Paying Living Expenses: Not very hard  Food Insecurity: No Food Insecurity (04/26/2017)   Hunger Vital Sign    Worried About Running Out of Food in the Last Year: Never true    Ran Out of Food in the Last Year: Never true  Transportation Needs: No Transportation Needs (04/26/2017)   PRAPARE - Administrator, Civil Service (Medical): No    Lack of Transportation (Non-Medical): No  Physical Activity: Inactive (09/14/2017)   Exercise Vital Sign    Days of Exercise per Week: 0 days    Minutes of Exercise per Session: 0 min  Stress: Unknown (04/26/2017)   [employer] of Occupational Health - Occupational Stress Questionnaire    Feeling of Stress : Patient declined  Social Connections: Unknown (05/10/2023)   Social Connection and Isolation Panel [NHANES]    Frequency of Communication with Friends and Family: Not on file    Frequency of Social Gatherings with Friends and Family: Not on file    Attends Religious Services: Not on file    Active Member of Clubs or Organizations: Patient unable to answer    Attends Banker Meetings: Not on file    Marital Status: Not on file  Intimate Partner Violence: Not At Risk (04/26/2017)   Humiliation, Afraid, Rape, and Kick questionnaire    Fear of Current or Ex-Partner: No    Emotionally Abused: No    Physically Abused: No    Sexually Abused: No   Social History   Tobacco Use  Smoking Status Never  Smokeless Tobacco Never   Social History   Substance and Sexual Activity  Alcohol Use No   Alcohol/week: 0.0 standard drinks of alcohol    Family History:  Family History  Problem Relation Age of Onset   Hypertension Mother    Graves' disease Mother    Breast cancer Mother    Mental illness Father     Past medical  history, surgical history, medications, allergies, family history and social history reviewed with patient today and changes made to appropriate areas of the chart.   Review of Systems  Eyes:  Negative for blurred vision and double vision.  Respiratory:  Negative for shortness of breath.   Cardiovascular:  Negative for chest pain, palpitations and leg swelling.  Neurological:  Negative for dizziness and headaches.   All other ROS negative except what is listed above and in the HPI.      Objective:    BP 137/86 (BP Location: Right Arm, Patient Position: Sitting, Cuff Size: Large)   Pulse 92   Ht 5\' 5"  (1.651 m)   Wt 149 lb (67.6 kg)   SpO2 96%   BMI 24.79 kg/m   Wt Readings from Last 3 Encounters:  07/23/23 149 lb (67.6 kg)  05/10/23 152 lb 9.6 oz (69.2 kg)  01/22/23 150 lb 3.2 oz (68.1 kg)    Physical Exam Vitals and nursing note reviewed.  Constitutional:      General: He is not in acute distress.    Appearance: Normal appearance. He is not ill-appearing, toxic-appearing or diaphoretic.  HENT:     Head: Normocephalic.     Right Ear: Tympanic membrane, ear canal and external ear normal.     Left Ear: Tympanic membrane, ear canal and external ear normal.     Nose: Nose normal. No congestion or rhinorrhea.     Mouth/Throat:     Mouth: Mucous membranes are moist.  Eyes:     General:        Right eye: No discharge.        Left eye: No discharge.     Extraocular Movements: Extraocular movements intact.     Conjunctiva/sclera: Conjunctivae normal.     Pupils: Pupils are equal, round, and reactive to light.  Cardiovascular:     Rate and Rhythm: Normal rate and regular rhythm.     Heart sounds: No murmur heard. Pulmonary:     Effort: Pulmonary effort is normal. No respiratory distress.     Breath sounds: Normal breath sounds. No wheezing, rhonchi or rales.  Abdominal:     General: Abdomen is flat. Bowel sounds are normal. There is no distension.     Palpations: Abdomen is  soft.     Tenderness: There is no abdominal tenderness. There is no guarding.  Musculoskeletal:     Cervical back: Normal range of motion and neck supple.  Skin:    General: Skin is warm and dry.     Capillary Refill: Capillary refill takes less than 2 seconds.  Neurological:     General: No focal deficit present.     Mental Status: He is alert and oriented to person, place, and time.     Cranial Nerves: No cranial nerve deficit.     Motor: No weakness.     Deep Tendon Reflexes: Reflexes normal.  Psychiatric:        Mood and Affect: Mood normal.        Behavior: Behavior normal.        Thought Content: Thought content normal.        Judgment: Judgment normal.     Results for orders placed or performed in visit on 05/10/23  Comp Met (CMET)   Collection Time: 05/10/23  9:54 AM  Result Value Ref Range   Glucose 80 70 - 99 mg/dL   BUN 10 6 - 20 mg/dL   Creatinine, Ser 1.61 (L) 0.76 - 1.27 mg/dL   eGFR 096 >04 VW/UJW/1.19   BUN/Creatinine Ratio 14 9 - 20   Sodium 140 134 - 144 mmol/L   Potassium 4.0 3.5 - 5.2 mmol/L   Chloride 104 96 - 106 mmol/L   CO2 22 20 - 29 mmol/L   Calcium 9.5 8.7 - 10.2 mg/dL   Total Protein 7.4 6.0 -  8.5 g/dL   Albumin 4.3 4.1 - 5.1 g/dL   Globulin, Total 3.1 1.5 - 4.5 g/dL   Bilirubin Total 0.4 0.0 - 1.2 mg/dL   Alkaline Phosphatase 53 44 - 121 IU/L   AST 18 0 - 40 IU/L   ALT 22 0 - 44 IU/L  Lipid Profile   Collection Time: 05/10/23  9:54 AM  Result Value Ref Range   Cholesterol, Total 209 (H) 100 - 199 mg/dL   Triglycerides 811 (H) 0 - 149 mg/dL   HDL 45 >91 mg/dL   VLDL Cholesterol Cal 27 5 - 40 mg/dL   LDL Chol Calc (NIH) 478 (H) 0 - 99 mg/dL   Chol/HDL Ratio 4.6 0.0 - 5.0 ratio  HgB A1c   Collection Time: 05/10/23  9:54 AM  Result Value Ref Range   Hgb A1c MFr Bld 5.3 4.8 - 5.6 %   Est. average glucose Bld gHb Est-mCnc 105 mg/dL      Assessment & Plan:   Problem List Items Addressed This Visit       Other   Dyslipidemia    Other Visit Diagnoses       Annual physical exam    -  Primary        Discussed aspirin prophylaxis for myocardial infarction prevention and decision was it was not indicated  LABORATORY TESTING:  Health maintenance labs ordered today as discussed above.     IMMUNIZATIONS:   - Tdap: Tetanus vaccination status reviewed: On Medicare. - Influenza: Up to date - Pneumovax: Not applicable - Prevnar: Not applicable - COVID: Up to date - HPV: Not applicable - Shingrix vaccine: Not applicable  SCREENING: - Colonoscopy: Not applicable  Discussed with patient purpose of the colonoscopy is to detect colon cancer at curable precancerous or early stages   - AAA Screening: Not applicable  -Hearing Test: Not applicable  -Spirometry: Not applicable   PATIENT COUNSELING:    Sexuality: Discussed sexually transmitted diseases, partner selection, use of condoms, avoidance of unintended pregnancy  and contraceptive alternatives.   Advised to avoid cigarette smoking.  I discussed with the patient that most people either abstain from alcohol or drink within safe limits (<=14/week and <=4 drinks/occasion for males, <=7/weeks and <= 3 drinks/occasion for females) and that the risk for alcohol disorders and other health effects rises proportionally with the number of drinks per week and how often a drinker exceeds daily limits.  Discussed cessation/primary prevention of drug use and availability of treatment for abuse.   Diet: Encouraged to adjust caloric intake to maintain  or achieve ideal body weight, to reduce intake of dietary saturated fat and total fat, to limit sodium intake by avoiding high sodium foods and not adding table salt, and to maintain adequate dietary potassium and calcium preferably from fresh fruits, vegetables, and low-fat dairy products.    stressed the importance of regular exercise  Injury prevention: Discussed safety belts, safety helmets, smoke detector, smoking near  bedding or upholstery.   Dental health: Discussed importance of regular tooth brushing, flossing, and dental visits.   Follow up plan: NEXT PREVENTATIVE PHYSICAL DUE IN 1 YEAR. No follow-ups on file.

## 2023-07-23 ENCOUNTER — Ambulatory Visit (INDEPENDENT_AMBULATORY_CARE_PROVIDER_SITE_OTHER): Payer: 59 | Admitting: Nurse Practitioner

## 2023-07-23 ENCOUNTER — Encounter: Payer: Self-pay | Admitting: Nurse Practitioner

## 2023-07-23 VITALS — BP 137/86 | HR 92 | Ht 65.0 in | Wt 149.0 lb

## 2023-07-23 DIAGNOSIS — Z0000 Encounter for general adult medical examination without abnormal findings: Secondary | ICD-10-CM | POA: Diagnosis not present

## 2023-07-23 DIAGNOSIS — E785 Hyperlipidemia, unspecified: Secondary | ICD-10-CM

## 2023-07-24 ENCOUNTER — Encounter: Payer: Self-pay | Admitting: Nurse Practitioner

## 2023-07-24 LAB — URINALYSIS, ROUTINE W REFLEX MICROSCOPIC
Bilirubin, UA: NEGATIVE
Glucose, UA: NEGATIVE
Ketones, UA: NEGATIVE
Leukocytes,UA: NEGATIVE
Nitrite, UA: NEGATIVE
Protein,UA: NEGATIVE
RBC, UA: NEGATIVE
Specific Gravity, UA: 1.015 (ref 1.005–1.030)
Urobilinogen, Ur: 0.2 mg/dL (ref 0.2–1.0)
pH, UA: 7 (ref 5.0–7.5)

## 2023-07-24 LAB — CBC WITH DIFFERENTIAL/PLATELET
Basophils Absolute: 0 10*3/uL (ref 0.0–0.2)
Basos: 0 %
EOS (ABSOLUTE): 0.1 10*3/uL (ref 0.0–0.4)
Eos: 1 %
Hematocrit: 42 % (ref 37.5–51.0)
Hemoglobin: 13.9 g/dL (ref 13.0–17.7)
Immature Grans (Abs): 0 10*3/uL (ref 0.0–0.1)
Immature Granulocytes: 0 %
Lymphocytes Absolute: 2.3 10*3/uL (ref 0.7–3.1)
Lymphs: 45 %
MCH: 31.4 pg (ref 26.6–33.0)
MCHC: 33.1 g/dL (ref 31.5–35.7)
MCV: 95 fL (ref 79–97)
Monocytes Absolute: 0.5 10*3/uL (ref 0.1–0.9)
Monocytes: 10 %
Neutrophils Absolute: 2.3 10*3/uL (ref 1.4–7.0)
Neutrophils: 44 %
Platelets: 240 10*3/uL (ref 150–450)
RBC: 4.43 x10E6/uL (ref 4.14–5.80)
RDW: 14 % (ref 11.6–15.4)
WBC: 5.2 10*3/uL (ref 3.4–10.8)

## 2023-07-24 LAB — COMPREHENSIVE METABOLIC PANEL
ALT: 16 IU/L (ref 0–44)
AST: 19 IU/L (ref 0–40)
Albumin: 4.2 g/dL (ref 4.1–5.1)
Alkaline Phosphatase: 52 IU/L (ref 44–121)
BUN/Creatinine Ratio: 14 (ref 9–20)
BUN: 10 mg/dL (ref 6–20)
Bilirubin Total: 0.4 mg/dL (ref 0.0–1.2)
CO2: 23 mmol/L (ref 20–29)
Calcium: 9.9 mg/dL (ref 8.7–10.2)
Chloride: 103 mmol/L (ref 96–106)
Creatinine, Ser: 0.72 mg/dL — ABNORMAL LOW (ref 0.76–1.27)
Globulin, Total: 2.8 g/dL (ref 1.5–4.5)
Glucose: 83 mg/dL (ref 70–99)
Potassium: 4.2 mmol/L (ref 3.5–5.2)
Sodium: 140 mmol/L (ref 134–144)
Total Protein: 7 g/dL (ref 6.0–8.5)
eGFR: 122 mL/min/{1.73_m2} (ref >59)

## 2023-07-24 LAB — LIPID PANEL
Chol/HDL Ratio: 4 ratio (ref 0.0–5.0)
Cholesterol, Total: 153 mg/dL (ref 100–199)
HDL: 38 mg/dL — ABNORMAL LOW (ref >39)
LDL Chol Calc (NIH): 94 mg/dL (ref 0–99)
Triglycerides: 117 mg/dL (ref 0–149)
VLDL Cholesterol Cal: 21 mg/dL (ref 5–40)

## 2023-07-24 LAB — TSH: TSH: 1.15 u[IU]/mL (ref 0.450–4.500)

## 2023-07-30 ENCOUNTER — Other Ambulatory Visit: Payer: Self-pay | Admitting: Nurse Practitioner

## 2023-07-30 DIAGNOSIS — J3089 Other allergic rhinitis: Secondary | ICD-10-CM

## 2023-08-01 ENCOUNTER — Other Ambulatory Visit: Payer: Self-pay

## 2023-08-01 DIAGNOSIS — J3089 Other allergic rhinitis: Secondary | ICD-10-CM

## 2023-08-01 MED ORDER — MONTELUKAST SODIUM 10 MG PO TABS: 10.0000 mg | ORAL_TABLET | Freq: Every day | ORAL | 1 refills | Status: DC

## 2023-08-01 NOTE — Telephone Encounter (Signed)
 Duplicate rrquest- filled 08/01/23 #[id number] Requested Prescriptions  Pending Prescriptions Disp Refills   montelukast (SINGULAIR) 10 MG tablet [Pharmacy Med Name: Montelukast Sodium 10 MG Tablet] 3 tablet 10    Sig: TAKE 1 TABLET BY MOUTH EVERY NIGHT AT BEDTIME     Pulmonology:  Leukotriene Inhibitors Passed - 08/01/2023 10:43 AM      Passed - Valid encounter within last 12 months    Recent Outpatient Visits           2 months ago Encounter for annual wellness exam in Medicare patient   [place] [facility] Larae Grooms, NP   6 months ago Primary hypertension   [place] [facility] Mecum, Oswaldo Conroy, PA-C   1 year ago Encounter for annual wellness exam in Medicare patient   [place] [facility] Larae Grooms, NP   2 years ago Benign essential hypertension   [place] [facility] Larae Grooms, NP   2 years ago Cough headache   [place] [facility] Vigg, Roma Schanz, MD

## 2023-08-27 ENCOUNTER — Other Ambulatory Visit: Payer: Self-pay | Admitting: Nurse Practitioner

## 2023-08-27 DIAGNOSIS — J3089 Other allergic rhinitis: Secondary | ICD-10-CM

## 2023-08-28 NOTE — Telephone Encounter (Signed)
 Requested Prescriptions  Pending Prescriptions Disp Refills   ALLERGY RELIEF 10 MG tablet [Pharmacy Med Name: Allergy Relief 10 MG Tablet] 3 tablet 10    Sig: TAKE 1 TABLET BY MOUTH ONCE DAILY     Ear, Nose, and Throat:  Antihistamines 2 Failed - 08/28/2023 12:40 PM      Failed - Cr in normal range and within 360 days    Creat  Date Value Ref Range Status  05/08/2019 0.78 0.60 - 1.35 mg/dL Final   Creatinine, Ser  Date Value Ref Range Status  07/23/2023 0.72 (L) 0.76 - 1.27 mg/dL Final         Passed - Valid encounter within last 12 months    Recent Outpatient Visits           1 month ago Annual physical exam   [place] [facility] Aileen Alexanders, NP

## 2023-10-16 NOTE — Progress Notes (Signed)
   10/16/2023  Patient ID: David Santos, male   DOB: [birth year], 38 y.o.   MRN: [id number]  This patient is appearing on a report for being at risk of failing the adherence measure for hypertension (ACEi/ARB) medications this calendar year.   Medication: lisinopril  20mg  daily  Last fill date: 3/24 for 30 day supply  MyChart message sent to patient.  Linn Rich, PharmD, DPLA

## 2023-10-24 ENCOUNTER — Other Ambulatory Visit: Payer: Self-pay

## 2023-10-24 NOTE — Progress Notes (Signed)
   10/24/2023  Patient ID: David Santos, male   DOB: 11/19/1985, 38 y.o.   MRN: [id number]  This patient is appearing on a report for being at risk of failing the adherence measure for hypertension (ACEi/ARB) medications this calendar year.   Medication: lisinopril  20mg   Last fill date: 3/24 for 30 day supply  Patient lives in a group home, and eligibility check no longer showing prescription drug coverage.  I have left a voicemail for patient's mother, Merlinda Starling [facility]), and am also sending a MyChart message to see if I can assist with medication access/affordability.  Linn Rich, PharmD, DPLA

## 2023-11-04 ENCOUNTER — Other Ambulatory Visit: Payer: Self-pay | Admitting: Nurse Practitioner

## 2023-11-04 DIAGNOSIS — I10 Essential (primary) hypertension: Secondary | ICD-10-CM

## 2023-11-06 NOTE — Telephone Encounter (Signed)
 Requested Prescriptions  Pending Prescriptions Disp Refills   amLODipine  (NORVASC ) 2.5 MG tablet [Pharmacy Med Name: amLODIPine  Besylate 2.5 MG Tablet] 90 tablet 0    Sig: TAKE 1 TABLET BY MOUTH ONCE DAILY     Cardiovascular: Calcium  Channel Blockers 2 Passed - 11/06/2023  2:07 PM      Passed - Last BP in normal range    BP Readings from Last 1 Encounters:  07/23/23 137/86         Passed - Last Heart Rate in normal range    Pulse Readings from Last 1 Encounters:  07/23/23 92         Passed - Valid encounter within last 6 months    Recent Outpatient Visits           3 months ago Annual physical exam   [place] [facility] Melvin Pao, NP               lisinopril  (ZESTRIL ) 20 MG tablet [Pharmacy Med Name: Lisinopril  20 MG Tablet] 90 tablet 0    Sig: TAKE 1 TABLET BY MOUTH ONCE DAILY     Cardiovascular:  ACE Inhibitors Failed - 11/06/2023  2:07 PM      Failed - Cr in normal range and within 180 days    Creat  Date Value Ref Range Status  05/08/2019 0.78 0.60 - 1.35 mg/dL Final   Creatinine, Ser  Date Value Ref Range Status  07/23/2023 0.72 (L) 0.76 - 1.27 mg/dL Final         Passed - K in normal range and within 180 days    Potassium  Date Value Ref Range Status  07/23/2023 4.2 3.5 - 5.2 mmol/L Final  11/15/2012 3.9 3.5 - 5.1 mmol/L Final         Passed - Patient is not pregnant      Passed - Last BP in normal range    BP Readings from Last 1 Encounters:  07/23/23 137/86         Passed - Valid encounter within last 6 months    Recent Outpatient Visits           3 months ago Annual physical exam   [place] [facility] Melvin Pao, NP

## 2024-01-23 ENCOUNTER — Ambulatory Visit: Admitting: Nurse Practitioner

## 2024-01-23 ENCOUNTER — Encounter: Payer: Self-pay | Admitting: Nurse Practitioner

## 2024-01-23 VITALS — BP 138/96 | HR 111 | Temp 98.7°F | Ht 65.0 in | Wt 153.0 lb

## 2024-01-23 DIAGNOSIS — E559 Vitamin D deficiency, unspecified: Secondary | ICD-10-CM | POA: Diagnosis not present

## 2024-01-23 DIAGNOSIS — F2081 Schizophreniform disorder: Secondary | ICD-10-CM | POA: Diagnosis not present

## 2024-01-23 DIAGNOSIS — R7303 Prediabetes: Secondary | ICD-10-CM

## 2024-01-23 DIAGNOSIS — I10 Essential (primary) hypertension: Secondary | ICD-10-CM | POA: Diagnosis not present

## 2024-01-23 DIAGNOSIS — Z23 Encounter for immunization: Secondary | ICD-10-CM

## 2024-01-23 DIAGNOSIS — E785 Hyperlipidemia, unspecified: Secondary | ICD-10-CM

## 2024-01-23 NOTE — Assessment & Plan Note (Signed)
 Chronic, Controlled. Recheck Lipid panel today for monitoring Patient not currently taking medication - may need to add for further protection given his psychiatric medications. Follow up in 6 months or sooner if concerns arise

## 2024-01-23 NOTE — Progress Notes (Signed)
 BP (!) 138/96 (BP Location: Left Arm, Cuff Size: Normal)   Pulse (!) 111   Temp 98.7 F (37.1 C) (Oral)   Ht 5' 5 (1.651 m)   Wt 153 lb (69.4 kg)   SpO2 96%   BMI 25.46 kg/m    Subjective:    Patient ID: David Santos, male    DOB: 05/24/1985, 38 y.o.   MRN: [id number]  HPI: David Santos is a 38 y.o. male  Chief Complaint  Patient presents with   Hyperlipidemia   Hypertension   Prediabetes   Schizophrenia   HYPERTENSION without Chronic Kidney Disease Blood pressure is checked regularly and it is always high. Hypertension status: controlled  Satisfied with current treatment? yes Duration of hypertension: years BP monitoring frequency:  not checking BP range:  BP medication side effects:  no Medication compliance: excellent compliance Previous BP meds:amlodipine  and lisinopril  Aspirin: no Recurrent headaches: no Visual changes: no Palpitations: no Dyspnea: no Chest pain: no Lower extremity edema: no Dizzy/lightheaded: no  MOOD Patient is followed by Psychiatry.  He lives in a group home where psychiatrist comes to facility to management patient's medications.  Doing well on current medication regimen.  Follow up as discussed.   Relevant past medical, surgical, family and social history reviewed and updated as indicated. Interim medical history since our last visit reviewed. Allergies and medications reviewed and updated.  Review of Systems  Eyes:  Negative for visual disturbance.  Respiratory:  Negative for chest tightness and shortness of breath.   Cardiovascular:  Negative for chest pain, palpitations and leg swelling.  Neurological:  Negative for dizziness, light-headedness and headaches.    Per HPI unless specifically indicated above     Objective:    BP (!) 138/96 (BP Location: Left Arm, Cuff Size: Normal)   Pulse (!) 111   Temp 98.7 F (37.1 C) (Oral)   Ht 5' 5 (1.651 m)   Wt 153 lb (69.4 kg)   SpO2 96%   BMI 25.46 kg/m   Wt  Readings from Last 3 Encounters:  01/23/24 153 lb (69.4 kg)  07/23/23 149 lb (67.6 kg)  05/10/23 152 lb 9.6 oz (69.2 kg)    Physical Exam Vitals and nursing note reviewed.  Constitutional:      General: He is not in acute distress.    Appearance: Normal appearance. He is not ill-appearing, toxic-appearing or diaphoretic.  HENT:     Head: Normocephalic.     Right Ear: External ear normal.     Left Ear: External ear normal.     Nose: Nose normal. No congestion or rhinorrhea.     Mouth/Throat:     Mouth: Mucous membranes are moist.  Eyes:     General:        Right eye: No discharge.        Left eye: No discharge.     Extraocular Movements: Extraocular movements intact.     Conjunctiva/sclera: Conjunctivae normal.     Pupils: Pupils are equal, round, and reactive to light.  Cardiovascular:     Rate and Rhythm: Normal rate and regular rhythm.     Heart sounds: No murmur heard. Pulmonary:     Effort: Pulmonary effort is normal. No respiratory distress.     Breath sounds: Normal breath sounds. No wheezing, rhonchi or rales.  Abdominal:     General: Abdomen is flat. Bowel sounds are normal.  Musculoskeletal:     Cervical back: Normal range of motion and neck supple.  Skin:    General: Skin is warm and dry.     Capillary Refill: Capillary refill takes less than 2 seconds.  Neurological:     General: No focal deficit present.     Mental Status: He is alert and oriented to person, place, and time.  Psychiatric:        Mood and Affect: Mood normal.        Behavior: Behavior normal.        Thought Content: Thought content normal.     Results for orders placed or performed in visit on 07/23/23  Urinalysis, Routine w reflex microscopic   Collection Time: 07/23/23  9:04 AM  Result Value Ref Range   Specific Gravity, UA 1.015 1.005 - 1.030   pH, UA 7.0 5.0 - 7.5   Color, UA Yellow Yellow   Appearance Ur Clear Clear   Leukocytes,UA Negative Negative   Protein,UA Negative  Negative/Trace   Glucose, UA Negative Negative   Ketones, UA Negative Negative   RBC, UA Negative Negative   Bilirubin, UA Negative Negative   Urobilinogen, Ur 0.2 0.2 - 1.0 mg/dL   Nitrite, UA Negative Negative   Microscopic Examination Comment   TSH   Collection Time: 07/23/23  9:05 AM  Result Value Ref Range   TSH 1.150 0.450 - 4.500 uIU/mL  Lipid panel   Collection Time: 07/23/23  9:05 AM  Result Value Ref Range   Cholesterol, Total 153 100 - 199 mg/dL   Triglycerides 882 0 - 149 mg/dL   HDL 38 (L) >60 mg/dL   VLDL Cholesterol Cal 21 5 - 40 mg/dL   LDL Chol Calc (NIH) 94 0 - 99 mg/dL   Chol/HDL Ratio 4.0 0.0 - 5.0 ratio  CBC with Differential/Platelet   Collection Time: 07/23/23  9:05 AM  Result Value Ref Range   WBC 5.2 3.4 - 10.8 x10E3/uL   RBC 4.43 4.14 - 5.80 x10E6/uL   Hemoglobin 13.9 13.0 - 17.7 g/dL   Hematocrit 57.9 62.4 - 51.0 %   MCV 95 79 - 97 fL   MCH 31.4 26.6 - 33.0 pg   MCHC 33.1 31.5 - 35.7 g/dL   RDW 85.9 88.3 - 84.5 %   Platelets 240 150 - 450 x10E3/uL   Neutrophils 44 Not Estab. %   Lymphs 45 Not Estab. %   Monocytes 10 Not Estab. %   Eos 1 Not Estab. %   Basos 0 Not Estab. %   Neutrophils Absolute 2.3 1.4 - 7.0 x10E3/uL   Lymphocytes Absolute 2.3 0.7 - 3.1 x10E3/uL   Monocytes Absolute 0.5 0.1 - 0.9 x10E3/uL   EOS (ABSOLUTE) 0.1 0.0 - 0.4 x10E3/uL   Basophils Absolute 0.0 0.0 - 0.2 x10E3/uL   Immature Granulocytes 0 Not Estab. %   Immature Grans (Abs) 0.0 0.0 - 0.1 x10E3/uL  Comprehensive metabolic panel   Collection Time: 07/23/23  9:05 AM  Result Value Ref Range   Glucose 83 70 - 99 mg/dL   BUN 10 6 - 20 mg/dL   Creatinine, Ser 9.27 (L) 0.76 - 1.27 mg/dL   eGFR 877 >40 fO/fpw/8.26   BUN/Creatinine Ratio 14 9 - 20   Sodium 140 134 - 144 mmol/L   Potassium 4.2 3.5 - 5.2 mmol/L   Chloride 103 96 - 106 mmol/L   CO2 23 20 - 29 mmol/L   Calcium  9.9 8.7 - 10.2 mg/dL   Total Protein 7.0 6.0 - 8.5 g/dL   Albumin 4.2 4.1 - 5.1 g/dL  Globulin, Total 2.8 1.5 - 4.5 g/dL   Bilirubin Total 0.4 0.0 - 1.2 mg/dL   Alkaline Phosphatase 52 44 - 121 IU/L   AST 19 0 - 40 IU/L   ALT 16 0 - 44 IU/L      Assessment & Plan:   Problem List Items Addressed This Visit       Cardiovascular and Mediastinum   Hypertension   Chronic. Not well controlled. Will increase Lisinopril  to 40mg  daily.  Continue with amlodipine .  Follow up in 6 weeks.  Call sooner if concerns arise.       Relevant Orders   Comprehensive metabolic panel with GFR     Other   Vitamin D  deficiency   Relevant Orders   Vitamin D  (25 hydroxy)   Dyslipidemia   Chronic, Controlled. Recheck Lipid panel today for monitoring Patient not currently taking medication - may need to add for further protection given his psychiatric medications. Follow up in 6 months or sooner if concerns arise       Relevant Orders   Lipid panel   Schizophreniform disorder (HCC) - Primary   Chronic.  Group home reports patient is doing well.  Continues on Haloperidol, Respirdone, and Depakote .  He is managed by a psychiatrist who comes to the group home.  Continue with current regimen and continue to follow up with specialist. Checked Depakote  level during visit today.       Relevant Orders   Valproic acid level   Prediabetes   Labs ordered at visit today.  Will make recommendations based on lab results.        Relevant Orders   Hemoglobin A1c   Other Visit Diagnoses       Need for influenza vaccination       Relevant Orders   Flu vaccine trivalent PF, 6mos and older(Flulaval,Afluria,Fluarix,Fluzone) (Completed)        Follow up plan: Return in about 6 weeks (around 03/05/2024) for BP Check.

## 2024-01-23 NOTE — Assessment & Plan Note (Signed)
 Chronic.  Group home reports patient is doing well.  Continues on Haloperidol, Respirdone, and Depakote .  He is managed by a psychiatrist who comes to the group home.  Continue with current regimen and continue to follow up with specialist. Checked Depakote  level during visit today.

## 2024-01-23 NOTE — Assessment & Plan Note (Signed)
 Chronic. Not well controlled. Will increase Lisinopril  to 40mg  daily.  Continue with amlodipine .  Follow up in 6 weeks.  Call sooner if concerns arise.

## 2024-01-23 NOTE — Assessment & Plan Note (Signed)
 Labs ordered at visit today.  Will make recommendations based on lab results.

## 2024-01-24 ENCOUNTER — Ambulatory Visit: Payer: Self-pay | Admitting: Nurse Practitioner

## 2024-01-24 LAB — LIPID PANEL
Chol/HDL Ratio: 4.2 ratio (ref 0.0–5.0)
Cholesterol, Total: 165 mg/dL (ref 100–199)
HDL: 39 mg/dL — ABNORMAL LOW (ref >39)
LDL Chol Calc (NIH): 88 mg/dL (ref 0–99)
Triglycerides: 226 mg/dL — ABNORMAL HIGH (ref 0–149)
VLDL Cholesterol Cal: 38 mg/dL (ref 5–40)

## 2024-01-24 LAB — COMPREHENSIVE METABOLIC PANEL WITH GFR
ALT: 14 IU/L (ref 0–44)
AST: 16 IU/L (ref 0–40)
Albumin: 4.2 g/dL (ref 4.1–5.1)
Alkaline Phosphatase: 52 IU/L (ref 47–123)
BUN/Creatinine Ratio: 11 (ref 9–20)
BUN: 8 mg/dL (ref 6–20)
Bilirubin Total: 0.3 mg/dL (ref 0.0–1.2)
CO2: 22 mmol/L (ref 20–29)
Calcium: 9.8 mg/dL (ref 8.7–10.2)
Chloride: 103 mmol/L (ref 96–106)
Creatinine, Ser: 0.75 mg/dL — ABNORMAL LOW (ref 0.76–1.27)
Globulin, Total: 2.7 g/dL (ref 1.5–4.5)
Glucose: 129 mg/dL — ABNORMAL HIGH (ref 70–99)
Potassium: 4.2 mmol/L (ref 3.5–5.2)
Sodium: 140 mmol/L (ref 134–144)
Total Protein: 6.9 g/dL (ref 6.0–8.5)
eGFR: 120 mL/min/1.73 (ref >59)

## 2024-01-24 LAB — HEMOGLOBIN A1C
Est. average glucose Bld gHb Est-mCnc: 111 mg/dL
Hgb A1c MFr Bld: 5.5 % (ref 4.8–5.6)

## 2024-01-24 LAB — VITAMIN D 25 HYDROXY (VIT D DEFICIENCY, FRACTURES): Vit D, 25-Hydroxy: 37.3 ng/mL (ref 30.0–100.0)

## 2024-01-25 ENCOUNTER — Other Ambulatory Visit: Payer: Self-pay | Admitting: Nurse Practitioner

## 2024-01-25 DIAGNOSIS — J3089 Other allergic rhinitis: Secondary | ICD-10-CM

## 2024-01-25 DIAGNOSIS — I10 Essential (primary) hypertension: Secondary | ICD-10-CM

## 2024-01-28 NOTE — Telephone Encounter (Signed)
 Requested Prescriptions  Pending Prescriptions Disp Refills   montelukast  (SINGULAIR ) 10 MG tablet [Pharmacy Med Name: Montelukast  Sodium 10 MG Tablet] 90 tablet 0    Sig: TAKE 1 TABLET BY MOUTH EVERY NIGHT AT BEDTIME     Pulmonology:  Leukotriene Inhibitors Passed - 01/28/2024  9:37 AM      Passed - Valid encounter within last 12 months    Recent Outpatient Visits           5 days ago Schizophreniform disorder [facility])   [place] [facility] Melvin Pao, NP   6 months ago Annual physical exam   [place] [facility] Melvin Pao, NP               amLODipine  (NORVASC ) 2.5 MG tablet [Pharmacy Med Name: amLODIPine  Besylate 2.5 MG Tablet] 90 tablet 0    Sig: TAKE 1 TABLET BY MOUTH ONCE DAILY     Cardiovascular: Calcium  Channel Blockers 2 Failed - 01/28/2024  9:37 AM      Failed - Last BP in normal range    BP Readings from Last 1 Encounters:  01/23/24 (!) 138/96         Failed - Last Heart Rate in normal range    Pulse Readings from Last 1 Encounters:  01/23/24 (!) 111         Passed - Valid encounter within last 6 months    Recent Outpatient Visits           5 days ago Schizophreniform disorder (HCC)   [place] [facility] Melvin Pao, NP   6 months ago Annual physical exam   [place] [facility] Melvin Pao, NP               lisinopril  (ZESTRIL ) 20 MG tablet [Pharmacy Med Name: Lisinopril  20 MG Tablet] 90 tablet 0    Sig: TAKE 1 TABLET BY MOUTH ONCE DAILY     Cardiovascular:  ACE Inhibitors Failed - 01/28/2024  9:37 AM      Failed - Cr in normal range and within 180 days    Creat  Date Value Ref Range Status  05/08/2019 0.78 0.60 - 1.35 mg/dL Final   Creatinine, Ser  Date Value Ref Range Status  01/23/2024 0.75 (L) 0.76 - 1.27 mg/dL Final         Failed - Last BP in normal range    BP Readings from Last 1 Encounters:  01/23/24 (!) 138/96         Passed - K in normal  range and within 180 days    Potassium  Date Value Ref Range Status  01/23/2024 4.2 3.5 - 5.2 mmol/L Final  11/15/2012 3.9 3.5 - 5.1 mmol/L Final         Passed - Patient is not pregnant      Passed - Valid encounter within last 6 months    Recent Outpatient Visits           5 days ago Schizophreniform disorder [facility])   [place] [facility] Melvin Pao, NP   6 months ago Annual physical exam    [facility] Melvin Pao, NP

## 2024-03-05 ENCOUNTER — Encounter: Payer: Self-pay | Admitting: Nurse Practitioner

## 2024-03-05 ENCOUNTER — Ambulatory Visit: Admitting: Nurse Practitioner

## 2024-03-05 VITALS — BP 128/84 | HR 93 | Temp 98.3°F | Ht 65.0 in | Wt 151.8 lb

## 2024-03-05 DIAGNOSIS — F2081 Schizophreniform disorder: Secondary | ICD-10-CM

## 2024-03-05 DIAGNOSIS — I10 Essential (primary) hypertension: Secondary | ICD-10-CM | POA: Diagnosis not present

## 2024-03-05 MED ORDER — LISINOPRIL 40 MG PO TABS: 40.0000 mg | ORAL_TABLET | Freq: Every day | ORAL | 1 refills | Status: AC

## 2024-03-05 NOTE — Progress Notes (Signed)
 BP 128/84 (BP Location: Left Arm, Cuff Size: Normal)   Pulse 93   Temp 98.3 F (36.8 C) (Oral)   Ht 5' 5 (1.651 m)   Wt 151 lb 12.8 oz (68.9 kg)   SpO2 98%   BMI 25.26 kg/m    Subjective:    Patient ID: David Santos, male    DOB: 07/24/1985, 38 y.o.   MRN: [id number]  HPI: David Santos is a 38 y.o. male  Chief Complaint  Patient presents with   Hypertension   HYPERTENSION without Chronic Kidney Disease Hypertension status: controlled  Satisfied with current treatment? yes Duration of hypertension: years BP monitoring frequency:  gets it checked at the group home but they are unsure what it is running BP range:  BP medication side effects:  no Medication compliance: excellent compliance Previous BP meds:amlodipine  and lisinopril  Aspirin: no Recurrent headaches: no Visual changes: no Palpitations: no Dyspnea: no Chest pain: no Lower extremity edema: no Dizzy/lightheaded: no   Relevant past medical, surgical, family and social history reviewed and updated as indicated. Interim medical history since our last visit reviewed. Allergies and medications reviewed and updated.  Review of Systems  Eyes:  Negative for visual disturbance.  Respiratory:  Negative for shortness of breath.   Cardiovascular:  Negative for chest pain and leg swelling.  Neurological:  Negative for light-headedness and headaches.    Per HPI unless specifically indicated above     Objective:    BP 128/84 (BP Location: Left Arm, Cuff Size: Normal)   Pulse 93   Temp 98.3 F (36.8 C) (Oral)   Ht 5' 5 (1.651 m)   Wt 151 lb 12.8 oz (68.9 kg)   SpO2 98%   BMI 25.26 kg/m   Wt Readings from Last 3 Encounters:  03/05/24 151 lb 12.8 oz (68.9 kg)  01/23/24 153 lb (69.4 kg)  07/23/23 149 lb (67.6 kg)    Physical Exam Vitals and nursing note reviewed.  Constitutional:      General: He is not in acute distress.    Appearance: Normal appearance. He is not ill-appearing,  toxic-appearing or diaphoretic.  HENT:     Head: Normocephalic.     Right Ear: External ear normal.     Left Ear: External ear normal.     Nose: Nose normal. No congestion or rhinorrhea.     Mouth/Throat:     Mouth: Mucous membranes are moist.  Eyes:     General:        Right eye: No discharge.        Left eye: No discharge.     Extraocular Movements: Extraocular movements intact.     Conjunctiva/sclera: Conjunctivae normal.     Pupils: Pupils are equal, round, and reactive to light.  Cardiovascular:     Rate and Rhythm: Normal rate and regular rhythm.     Heart sounds: No murmur heard. Pulmonary:     Effort: Pulmonary effort is normal. No respiratory distress.     Breath sounds: Normal breath sounds. No wheezing, rhonchi or rales.  Abdominal:     General: Abdomen is flat. Bowel sounds are normal.  Musculoskeletal:     Cervical back: Normal range of motion and neck supple.  Skin:    General: Skin is warm and dry.     Capillary Refill: Capillary refill takes less than 2 seconds.  Neurological:     General: No focal deficit present.     Mental Status: He is alert and oriented to  person, place, and time.  Psychiatric:        Mood and Affect: Mood normal.        Behavior: Behavior normal.        Thought Content: Thought content normal.        Judgment: Judgment normal.     Results for orders placed or performed in visit on 01/23/24  Comprehensive metabolic panel with GFR   Collection Time: 01/23/24  9:24 AM  Result Value Ref Range   Glucose 129 (H) 70 - 99 mg/dL   BUN 8 6 - 20 mg/dL   Creatinine, Ser 9.24 (L) 0.76 - 1.27 mg/dL   eGFR 879 >40 fO/fpw/8.26   BUN/Creatinine Ratio 11 9 - 20   Sodium 140 134 - 144 mmol/L   Potassium 4.2 3.5 - 5.2 mmol/L   Chloride 103 96 - 106 mmol/L   CO2 22 20 - 29 mmol/L   Calcium  9.8 8.7 - 10.2 mg/dL   Total Protein 6.9 6.0 - 8.5 g/dL   Albumin 4.2 4.1 - 5.1 g/dL   Globulin, Total 2.7 1.5 - 4.5 g/dL   Bilirubin Total 0.3 0.0 - 1.2  mg/dL   Alkaline Phosphatase 52 47 - 123 IU/L   AST 16 0 - 40 IU/L   ALT 14 0 - 44 IU/L  Hemoglobin A1c   Collection Time: 01/23/24  9:24 AM  Result Value Ref Range   Hgb A1c MFr Bld 5.5 4.8 - 5.6 %   Est. average glucose Bld gHb Est-mCnc 111 mg/dL  Lipid panel   Collection Time: 01/23/24  9:24 AM  Result Value Ref Range   Cholesterol, Total 165 100 - 199 mg/dL   Triglycerides 773 (H) 0 - 149 mg/dL   HDL 39 (L) >60 mg/dL   VLDL Cholesterol Cal 38 5 - 40 mg/dL   LDL Chol Calc (NIH) 88 0 - 99 mg/dL   Chol/HDL Ratio 4.2 0.0 - 5.0 ratio  Vitamin D  (25 hydroxy)   Collection Time: 01/23/24  9:24 AM  Result Value Ref Range   Vit D, 25-Hydroxy 37.3 30.0 - 100.0 ng/mL      Assessment & Plan:   Problem List Items Addressed This Visit       Cardiovascular and Mediastinum   Hypertension - Primary   Chronic.  Controlled.  Continue with current medication regimen of Amlodipine  10mg  and Lisinopril  40mg .  Labs ordered today.  Return to clinic in 5 months for reevaluation.  Call sooner if concerns arise.        Relevant Medications   lisinopril  (ZESTRIL ) 40 MG tablet   Other Relevant Orders   Comp Met (CMET)     Other   Schizophreniform disorder (HCC)   Will check valproic acid level today.  It didn't get drawn at our last visit.      Relevant Orders   Valproic acid level     Follow up plan: Return in about 5 months (around 08/03/2024) for HTN, HLD, DM2 FU.

## 2024-03-05 NOTE — Assessment & Plan Note (Signed)
 Chronic.  Controlled.  Continue with current medication regimen of Amlodipine  10mg  and Lisinopril  40mg .  Labs ordered today.  Return to clinic in 5 months for reevaluation.  Call sooner if concerns arise.

## 2024-03-05 NOTE — Assessment & Plan Note (Signed)
 Will check valproic acid level today.  It didn't get drawn at our last visit.

## 2024-03-06 ENCOUNTER — Ambulatory Visit: Payer: Self-pay | Admitting: Nurse Practitioner

## 2024-03-06 LAB — COMPREHENSIVE METABOLIC PANEL WITH GFR
ALT: 16 IU/L (ref 0–44)
AST: 18 IU/L (ref 0–40)
Albumin: 4.4 g/dL (ref 4.1–5.1)
Alkaline Phosphatase: 51 IU/L (ref 47–123)
BUN/Creatinine Ratio: 9 (ref 9–20)
BUN: 7 mg/dL (ref 6–20)
Bilirubin Total: 0.3 mg/dL (ref 0.0–1.2)
CO2: 24 mmol/L (ref 20–29)
Calcium: 9.5 mg/dL (ref 8.7–10.2)
Chloride: 102 mmol/L (ref 96–106)
Creatinine, Ser: 0.79 mg/dL (ref 0.76–1.27)
Globulin, Total: 2.6 g/dL (ref 1.5–4.5)
Glucose: 82 mg/dL (ref 70–99)
Potassium: 4.3 mmol/L (ref 3.5–5.2)
Sodium: 140 mmol/L (ref 134–144)
Total Protein: 7 g/dL (ref 6.0–8.5)
eGFR: 118 mL/min/1.73 (ref >59)

## 2024-03-06 LAB — VALPROIC ACID LEVEL: Valproic Acid Lvl: 90 ug/mL (ref 50–100)

## 2024-04-22 ENCOUNTER — Other Ambulatory Visit: Payer: Self-pay | Admitting: Nurse Practitioner

## 2024-04-22 ENCOUNTER — Other Ambulatory Visit: Payer: Self-pay | Admitting: Internal Medicine

## 2024-04-22 DIAGNOSIS — I10 Essential (primary) hypertension: Secondary | ICD-10-CM

## 2024-04-22 DIAGNOSIS — J3089 Other allergic rhinitis: Secondary | ICD-10-CM

## 2024-04-25 NOTE — Telephone Encounter (Signed)
 Requested Prescriptions  Pending Prescriptions Disp Refills   montelukast  (SINGULAIR ) 10 MG tablet [Pharmacy Med Name: Montelukast  Sodium 10 MG Tablet] 90 tablet 0    Sig: TAKE 1 TABLET BY MOUTH EVERY NIGHT AT BEDTIME     Pulmonology:  Leukotriene Inhibitors Passed - 04/25/2024  1:09 PM      Passed - Valid encounter within last 12 months    Recent Outpatient Visits           1 month ago Primary hypertension   [facility] Melvin Pao, NP   3 months ago Schizophreniform disorder [facility])   [place] [facility] Melvin Pao, NP   9 months ago Annual physical exam   [facility] Melvin Pao, NP               amLODipine  (NORVASC ) 2.5 MG tablet [Pharmacy Med Name: amLODIPine  Besylate 2.5 MG Tablet] 90 tablet 0    Sig: TAKE 1 TABLET BY MOUTH ONCE DAILY     Cardiovascular: Calcium  Channel Blockers 2 Passed - 04/25/2024  1:09 PM      Passed - Last BP in normal range    BP Readings from Last 1 Encounters:  03/05/24 128/84         Passed - Last Heart Rate in normal range    Pulse Readings from Last 1 Encounters:  03/05/24 93         Passed - Valid encounter within last 6 months    Recent Outpatient Visits           1 month ago Primary hypertension   [place] [facility] Melvin Pao, NP   3 months ago Schizophreniform disorder [facility])   [place] [facility] Melvin Pao, NP   9 months ago Annual physical exam    [facility] Melvin Pao, NP

## 2024-04-25 NOTE — Telephone Encounter (Signed)
 Requested medication (s) are due for refill today: routing for review  Requested medication (s) are on the active medication list: yes  Last refill:  n/a  Future visit scheduled: yes  Notes to clinic:  Unable to refill per protocol, Rx expired.      Requested Prescriptions  Pending Prescriptions Disp Refills   Calcium  Carb-Cholecalciferol (OYSTER SHELL CALCIUM  W/D) 500-5 MG-MCG TABS [Pharmacy Med Name: Allie Gutta Calcium  w/D 500-5 MG-MCG Tablet] 3 tablet 10    Sig: TAKE 1 TABLET BY MOUTH ONCE DAILY W/ BREAKFAST     Endocrinology: Minerals - Calcium  Supplementation with Vitamin D  Passed - 04/25/2024  1:33 PM      Passed - Ca in normal range and within 360 days    Calcium   Date Value Ref Range Status  03/05/2024 9.5 8.7 - 10.2 mg/dL Final   Calcium , Total  Date Value Ref Range Status  11/15/2012 9.5 8.5 - 10.1 mg/dL Final         Passed - Vitamin D  in normal range and within 360 days    Vit D, 25-Hydroxy  Date Value Ref Range Status  01/23/2024 37.3 30.0 - 100.0 ng/mL Final    Comment:    Vitamin D  deficiency has been defined by the Institute of Medicine and an Endocrine Society practice guideline as a level of serum 25-OH vitamin D  less than 20 ng/mL (1,2). The Endocrine Society went on to further define vitamin D  insufficiency as a level between 21 and 29 ng/mL (2). 1. IOM (Institute of Medicine). 2010. Dietary reference    intakes for calcium  and D. [place]: The    [employer]. 2. Holick MF, Binkley [place], Bischoff-Ferrari HA, et al.    Evaluation, treatment, and prevention of vitamin D     deficiency: an Endocrine Society clinical practice    guideline. JCEM. 2011 Jul; 96(7):1911-30.          Passed - Valid encounter within last 12 months    Recent Outpatient Visits           1 month ago Primary hypertension   [place] [facility] Melvin Pao, NP   3 months ago Schizophreniform disorder [facility])   [place] [facility] Melvin Pao, NP   9 months ago Annual physical exam   [place] [facility] Melvin Pao, NP

## 2024-05-03 NOTE — Patient Instructions (Signed)
 Healthy Eating, Adult Healthy eating may help you get and keep a healthy body weight, reduce the risk of chronic disease, and live a long and productive life. It is important to follow a healthy eating pattern. Your nutritional and calorie needs should be met mainly by different nutrient-rich foods. What are tips for following this plan? Reading food labels Read labels and choose the following: Reduced or low sodium products. Juices with 100% fruit juice. Foods with low saturated fats (<3 g per serving) and high polyunsaturated and monounsaturated fats. Foods with whole grains, such as whole wheat, cracked wheat, brown rice, and wild rice. Whole grains that are fortified with folic acid . This is recommended for females who are pregnant or who want to become pregnant. Read labels and do not eat or drink the following: Foods or drinks with added sugars. These include foods that contain brown sugar, corn sweetener, corn syrup, dextrose , fructose, glucose, high-fructose corn syrup, honey, invert sugar, lactose, malt syrup, maltose, molasses, raw sugar, sucrose, trehalose, or turbinado sugar. Limit your intake of added sugars to less than 10% of your total daily calories. Do not eat more than the following amounts of added sugar per day: 6 teaspoons (25 g) for females. 9 teaspoons (38 g) for males. Foods that contain processed or refined starches and grains. Refined grain products, such as white flour, degermed cornmeal, white bread, and white rice. Shopping Choose nutrient-rich snacks, such as vegetables, whole fruits, and nuts. Avoid high-calorie and high-sugar snacks, such as potato chips, fruit snacks, and candy. Use oil-based dressings and spreads on foods instead of solid fats such as butter, margarine, sour cream, or cream cheese. Limit pre-made sauces, mixes, and instant products such as flavored rice, instant noodles, and ready-made pasta. Try more plant-protein sources, such as tofu,  tempeh, black beans, edamame, lentils, nuts, and seeds. Explore eating plans such as the Mediterranean diet or vegetarian diet. Try heart-healthy dips made with beans and healthy fats like hummus and guacamole. Vegetables go great with these. Cooking Use oil to saut or stir-fry foods instead of solid fats such as butter, margarine, or lard. Try baking, boiling, grilling, or broiling instead of frying. Remove the fatty part of meats before cooking. Steam vegetables in water  or broth. Meal planning  At meals, imagine dividing your plate into fourths: One-half of your plate is fruits and vegetables. One-fourth of your plate is whole grains. One-fourth of your plate is protein, especially lean meats, poultry, eggs, tofu, beans, or nuts. Include low-fat dairy as part of your daily diet. Lifestyle Choose healthy options in all settings, including home, work, school, restaurants, or stores. Prepare your food safely: Wash your hands after handling raw meats. Where you prepare food, keep surfaces clean by regularly washing with hot, soapy water . Keep raw meats separate from ready-to-eat foods, such as fruits and vegetables. Cook seafood, meat, poultry, and eggs to the recommended temperature. Get a food thermometer. Store foods at safe temperatures. In general: Keep cold foods at 34F (4.4C) or below. Keep hot foods at 134F (60C) or above. Keep your freezer at [facility] (-17.8C) or below. Foods are not safe to eat if they have been between the temperatures of 40-134F (4.4-60C) for more than 2 hours. What foods should I eat? Fruits Aim to eat 1-2 cups of fresh, canned (in natural juice), or frozen fruits each day. One cup of fruit equals 1 small apple, 1 large banana, 8 large strawberries, 1 cup (237 g) canned fruit,  cup (82 g) dried fruit,  or 1 cup (240 mL) 100% juice. Vegetables Aim to eat 2-4 cups of fresh and frozen vegetables each day, including different varieties and colors. One cup  of vegetables equals 1 cup (91 g) broccoli or cauliflower florets, 2 medium carrots, 2 cups (150 g) raw, leafy greens, 1 large tomato, 1 large bell pepper, 1 large sweet potato, or 1 medium white potato. Grains Aim to eat 5-10 ounce-equivalents of whole grains each day. Examples of 1 ounce-equivalent of grains include 1 slice of bread, 1 cup (40 g) ready-to-eat cereal, 3 cups (24 g) popcorn, or  cup (93 g) cooked rice. Meats and other proteins Try to eat 5-7 ounce-equivalents of protein each day. Examples of 1 ounce-equivalent of protein include 1 egg,  oz nuts (12 almonds, 24 pistachios, or 7 walnut halves), 1/4 cup (90 g) cooked beans, 6 tablespoons (90 g) hummus or 1 tablespoon (16 g) peanut butter. A cut of meat or fish that is the size of a deck of cards is about 3-4 ounce-equivalents (85 g). Of the protein you eat each week, try to have at least 8 sounce (227 g) of seafood. This is about 2 servings per week. This includes salmon, trout, herring, sardines, and anchovies. Dairy Aim to eat 3 cup-equivalents of fat-free or low-fat dairy each day. Examples of 1 cup-equivalent of dairy include 1 cup (240 mL) milk, 8 ounces (250 g) yogurt, 1 ounces (44 g) natural cheese, or 1 cup (240 mL) fortified soy milk. Fats and oils Aim for about 5 teaspoons (21 g) of fats and oils per day. Choose monounsaturated fats, such as canola and olive oils, mayonnaise made with olive oil or avocado oil, avocados, peanut butter, and most nuts, or polyunsaturated fats, such as sunflower, corn, and soybean oils, walnuts, pine nuts, sesame seeds, sunflower seeds, and flaxseed. Beverages Aim for 6 eight-ounce glasses of water  per day. Limit coffee to 3-5 eight-ounce cups per day. Limit caffeinated beverages that have added calories, such as soda and energy drinks. If you drink alcohol: Limit how much you have to: 0-1 drink a day if you are male. 0-2 drinks a day if you are male. Know how much alcohol is in your drink.  In the [place] one drink is one 12 oz bottle of beer (355 mL), one 5 oz glass of wine (148 mL), or one 1 oz glass of hard liquor (44 mL). Seasoning and other foods Try not to add too much salt to your food. Try using herbs and spices instead of salt. Try not to add sugar to food. This information is based on [place] nutrition guidelines. To learn more, visit DisposableN[employer].be. Exact amounts may vary. You may need different amounts. This information is not intended to replace advice given to you by your health care provider. Make sure you discuss any questions you have with your health care provider. Document Revised: 01/23/2022 Document Reviewed: 01/23/2022 Elsevier Patient Education  2024 [employer].

## 2024-05-09 ENCOUNTER — Ambulatory Visit: Admitting: Nurse Practitioner

## 2024-05-09 DIAGNOSIS — F2081 Schizophreniform disorder: Secondary | ICD-10-CM

## 2024-05-09 NOTE — Progress Notes (Signed)
 Error, not seen

## 2024-05-28 ENCOUNTER — Ambulatory Visit: Payer: Self-pay

## 2024-05-28 NOTE — Telephone Encounter (Signed)
 Please find out what the question is.  Patient is not diabetic.  His A1c was normal when it was checked in October.

## 2024-05-28 NOTE — Telephone Encounter (Signed)
 Patient has been called and a message left for them to return the call to the office. Ok for E2C2 to review if/when they return the call. Due to the nature of the call, please transfer call to CAL for immediate assistance.   Call to guardian to review. Was able to leave a message requesting call back.

## 2024-05-28 NOTE — Telephone Encounter (Signed)
 FYI Only or Action Required?: Action required by provider: request for appointment, clinical question for provider, and update on patient condition.  Patient was last seen in primary care on 03/05/2024 by Melvin Pao, NP.  Called Nurse Triage reporting Weight Loss.  Symptoms began unknown.  Interventions attempted: Nothing.  Symptoms are: gradually worsening.  Triage Disposition: See Physician Within 24 Hours - may go to [facility]  Patient/caregiver understands and will follow disposition?: Yes     Reason for Disposition  New-onset diabetes suspected (e.g., increased thirst, frequent urination, weight loss)  Answer Assessment - Initial Assessment Questions Pt representative reporting pt staff at group home concerned about pt symptoms, not taken blood sugar. Advised pt be examined in next 24 hours, refusing available appts in region today, advised UC today. Advised that blood sugar be taken asap, take to ED if above 500, take to ED if any weakness, vomiting, rapid breathing, call back if new or worsening symptoms. Pt representative verbalized understanding and intent to relay info to group home staff.      1. BLOOD GLUCOSE: What is your blood glucose level?      Not checked blood sugar  5. TYPE 1 or 2:  Do you know what type of diabetes you have?  (e.g., Type 1, Type 2, Gestational; doesn't know)      Thinking may have new onset  8. OTHER SYMPTOMS: Do you have any symptoms? (e.g., fever, frequent urination, difficulty breathing, dizziness, weakness, vomiting)     Weight loss Excessive thirst and drinking of wter Frequent urination  Unknown to pt representative when symptoms started Prediabetes per pt chart  Denies: Altered mental status Weakness Lightheadedness Vomiting Rapid breathing Fever  Protocols used: Diabetes - High Blood Sugar-A-AH

## 2024-05-29 NOTE — Telephone Encounter (Signed)
 Copied from CRM [id number]. Topic: Clinical - Medical Advice >> May 29, 2024  9:34 AM Myrick T wrote: Reason for CRM: patients mom Erminio called to find out why the office called her. Please return call to mom at [phone]

## 2024-05-30 ENCOUNTER — Encounter: Payer: Self-pay | Admitting: Emergency Medicine

## 2024-05-30 ENCOUNTER — Ambulatory Visit
Admission: EM | Admit: 2024-05-30 | Discharge: 2024-05-30 | Disposition: A | Discharge status: Home / Self Care | Attending: Emergency Medicine | Admitting: Emergency Medicine

## 2024-05-30 DIAGNOSIS — R350 Frequency of micturition: Secondary | ICD-10-CM | POA: Diagnosis not present

## 2024-05-30 LAB — POCT URINE DIPSTICK
Bilirubin, UA: NEGATIVE
Blood, UA: NEGATIVE
Glucose, UA: NEGATIVE mg/dL
Leukocytes, UA: NEGATIVE
Nitrite, UA: NEGATIVE
POC PROTEIN,UA: NEGATIVE
Spec Grav, UA: 1.01
Urobilinogen, UA: 0.2 U/dL
pH, UA: 6.5

## 2024-05-30 NOTE — ED Triage Notes (Signed)
 Pt stays in a group home. His caretaker states he has urinary frequency and weight loss x 2 months.

## 2024-05-30 NOTE — Telephone Encounter (Signed)
 Mom David Santos, patient's legal guardian returned call. She advises that Ms. David Santos from the group home did reach out to her concerned that maybe he needed his sugars checked as he is maybe losing weight, reduced appetite over the last 1-2 months and also urinating more often. I did advise her that his A1C was checked in Sept. 2025 and not concerning for diabetes.   We dicussed that he should be scheduled for an appointment to discuss what might be going on, if anything. She provided Ms. Clemetine number [phone] to call and schedule the appointment with her.   She also asked about access to his mychart account since the group home has it set up. Mom will need to come by the office and sign the Mychart Adult to Adult Proxy access form. Will be prefilled and waiting for her at the office.

## 2024-05-30 NOTE — Discharge Instructions (Addendum)
 Urine does not show signs of infection.    Weight today is 150.3.  Most recent weight listed in chart was 151.12 on 03/05/2024.  Schedule an appointment with the patient's primary care provider soon as possible.

## 2024-05-30 NOTE — ED Provider Notes (Signed)
 " David Santos    CSN: [id number] Arrival date & time: 05/30/24  1007      History   Chief Complaint Chief Complaint  Patient presents with   Urinary Frequency    HPI David Santos is a 39 y.o. male.  Accompanied by his caregiver, patient presents with 2 months history of urinary frequency, decreased appetite, and weight loss.  No fever, abdominal pain, dysuria, hematuria, vomiting, diarrhea, constipation, cough, shortness of breath.  No OTC medications given today.  Patient's medical history includes hypertension, moderate intellectual disability, autism spectrum disorder, schizophreniform disorder, psychosis.  The history is provided by the patient and medical records.    Past Medical History:  Diagnosis Date   Allergy    Autism    Hyperlipidemia    Hypertension    Psychosis (HCC)    Vitamin D  deficiency     Patient Active Problem List   Diagnosis Date Noted   Prediabetes 05/10/2023   Catatonia 04/10/2016   Schizophreniform disorder (HCC) 10/18/2015   Autism spectrum disorder    Dyslipidemia 06/28/2015   Hypertension 10/25/2014   Chronic insomnia 10/25/2014   History of acute renal failure 10/25/2014   Dysmetabolic syndrome 10/25/2014   Moderate intellectual disability 10/25/2014   Perennial allergic rhinitis 10/25/2014   Obese 10/25/2014   General psychoses (HCC) 10/25/2014   Seborrhea capitis 10/25/2014   Personal history of urethral stricture 10/25/2014   Vitamin D  deficiency 01/15/2009   Testicular hypofunction 07/17/2008    Past Surgical History:  Procedure Laterality Date   TONSILLECTOMY AND ADENOIDECTOMY         Home Medications    Prior to Admission medications  Medication Sig Start Date End Date Taking? Authorizing Provider  ALLERGY RELIEF 10 MG tablet TAKE 1 TABLET BY MOUTH ONCE DAILY 08/28/23   Melvin Pao, NP  amLODipine  (NORVASC ) 2.5 MG tablet TAKE 1 TABLET BY MOUTH ONCE DAILY 04/25/24   Melvin Pao, NP  Calcium   Carb-Cholecalciferol (OYSTER SHELL CALCIUM  W/D) 500-5 MG-MCG TABS TAKE 1 TABLET BY MOUTH ONCE DAILY W/ BREAKFAST 04/28/24   Melvin Pao, NP  calcium -vitamin D  (OSCAL WITH D) 500-200 MG-UNIT tablet Take 1 tablet by mouth daily with breakfast. 02/24/20   Justus Leita DEL, MD  divalproex  (DEPAKOTE  ER) 500 MG 24 hr tablet Take 500 mg by mouth at bedtime. 04/14/16   Daniel Lauth, MD  haloperidol (HALDOL) 10 MG tablet Take 10 mg by mouth 3 (three) times daily.     Daniel Lauth, MD  lisinopril  (ZESTRIL ) 40 MG tablet Take 1 tablet (40 mg total) by mouth daily. 03/05/24   Melvin Pao, NP  montelukast  (SINGULAIR ) 10 MG tablet TAKE 1 TABLET BY MOUTH EVERY NIGHT AT BEDTIME 04/25/24   Melvin Pao, NP  risperiDONE  (RISPERDAL ) 3 MG tablet Take 6 mg by mouth at bedtime.  03/03/19   Daniel Lauth, MD    Family History Family History  Problem Relation Age of Onset   Hypertension Mother    Yvone' disease Mother    Breast cancer Mother    Mental illness Father     Social History Social History[1]   Allergies   Hydrochlorothiazide   Review of Systems Review of Systems  Constitutional:  Negative for chills and fever.  Respiratory:  Negative for cough and shortness of breath.   Gastrointestinal:  Negative for abdominal pain, constipation, diarrhea and vomiting.  Genitourinary:  Positive for frequency. Negative for dysuria, flank pain and hematuria.     Physical Exam Triage Vital Signs ED Triage  Vitals  Encounter Vitals Group     BP 05/30/24 1116 133/85     Girls Systolic BP Percentile --      Girls Diastolic BP Percentile --      Boys Systolic BP Percentile --      Boys Diastolic BP Percentile --      Pulse Rate 05/30/24 1116 96     Resp 05/30/24 1116 18     Temp 05/30/24 1116 98.1 F (36.7 C)     Temp src --      SpO2 05/30/24 1116 97 %     Weight --      Height --      Head Circumference --      Peak Flow --      Pain Score 05/30/24 1124 0     Pain Loc --      Pain  Education --      Exclude from Growth Chart --    No data found.  Updated Vital Signs BP 133/85   Pulse 96   Temp 98.1 F (36.7 C)   Resp 18   Wt 150 lb 3.2 oz (68.1 kg)   SpO2 97%   BMI 24.99 kg/m   Visual Acuity Right Eye Distance:   Left Eye Distance:   Bilateral Distance:    Right Eye Near:   Left Eye Near:    Bilateral Near:     Physical Exam Constitutional:      General: He is not in acute distress. HENT:     Mouth/Throat:     Mouth: Mucous membranes are moist.  Cardiovascular:     Rate and Rhythm: Normal rate.  Pulmonary:     Effort: Pulmonary effort is normal. No respiratory distress.  Abdominal:     General: Bowel sounds are normal.     Palpations: Abdomen is soft.     Tenderness: There is no abdominal tenderness. There is no right CVA tenderness, left CVA tenderness, guarding or rebound.  Neurological:     Mental Status: He is alert.      UC Treatments / Results  Labs (all labs ordered are listed, but only abnormal results are displayed) Labs Reviewed  POCT URINE DIPSTICK - Abnormal; Notable for the following components:      Result Value   Ketones, POC UA trace (5) (*)    All other components within normal limits    EKG   Radiology No results found.  Procedures Procedures (including critical care time)  Medications Ordered in UC Medications - No data to display  Initial Impression / Assessment and Plan / UC Course  I have reviewed the triage vital signs and the nursing notes.  Pertinent labs & imaging results that were available during my care of the patient were reviewed by me and considered in my medical decision making (see chart for details).    Urinary frequency.  Afebrile and vital signs are stable.  Urine does not show signs of infection.  Weight today compared to most recent weight and chart from 2 months ago and appears to be decreased by approximately 1.5 pounds.  Discussed results with his caregiver and patient.   Instructed his caregiver to schedule an appointment with his PCP.  Education provided on urinary frequency.  Patient and caregiver agree to plan of care.  Final Clinical Impressions(s) / UC Diagnoses   Final diagnoses:  Urinary frequency     Discharge Instructions      Urine does not show signs of infection.  Weight today is 150.3.  Most recent weight listed in chart was 151.12 on 03/05/2024.  Schedule an appointment with the patient's primary care provider soon as possible.     ED Prescriptions   None    PDMP not reviewed this encounter.    [1]  Social History Tobacco Use   Smoking status: Never   Smokeless tobacco: Never  Vaping Use   Vaping status: Never Used  Substance Use Topics   Alcohol use: No    Alcohol/week: 0.0 standard drinks of alcohol   Drug use: No     Corlis Burnard DEL, NP 05/30/24 1158  "

## 2024-06-11 ENCOUNTER — Ambulatory Visit: Admitting: Nurse Practitioner

## 2024-07-02 ENCOUNTER — Ambulatory Visit: Admitting: Nurse Practitioner

## 2024-08-04 ENCOUNTER — Ambulatory Visit: Admitting: Nurse Practitioner
